# Patient Record
Sex: Female | Born: 1985 | Hispanic: Yes | Marital: Married | Smoking: Never smoker
Health system: Southern US, Community
[De-identification: ages and names within clinical notes are randomized; demographics above are authoritative.]

## PROBLEM LIST (undated history)

## (undated) DIAGNOSIS — T7840XA Allergy, unspecified, initial encounter: Secondary | ICD-10-CM

## (undated) DIAGNOSIS — F32A Depression, unspecified: Secondary | ICD-10-CM

## (undated) DIAGNOSIS — F419 Anxiety disorder, unspecified: Secondary | ICD-10-CM

## (undated) DIAGNOSIS — A6000 Herpesviral infection of urogenital system, unspecified: Secondary | ICD-10-CM

## (undated) DIAGNOSIS — K219 Gastro-esophageal reflux disease without esophagitis: Secondary | ICD-10-CM

## (undated) HISTORY — PX: WISDOM TOOTH EXTRACTION: SHX21

## (undated) HISTORY — DX: Anxiety disorder, unspecified: F41.9

## (undated) HISTORY — DX: Herpesviral infection of urogenital system, unspecified: A60.00

## (undated) HISTORY — DX: Gastro-esophageal reflux disease without esophagitis: K21.9

## (undated) HISTORY — DX: Depression, unspecified: F32.A

## (undated) HISTORY — DX: Allergy, unspecified, initial encounter: T78.40XA

---

## 2005-07-25 ENCOUNTER — Emergency Department: Payer: Self-pay | Admitting: Unknown Physician Specialty

## 2008-07-19 ENCOUNTER — Emergency Department (HOSPITAL_COMMUNITY): Admission: EM | Admit: 2008-07-19 | Discharge: 2008-07-19 | Payer: Self-pay | Admitting: Emergency Medicine

## 2010-07-23 ENCOUNTER — Inpatient Hospital Stay (HOSPITAL_COMMUNITY)
Admission: AD | Admit: 2010-07-23 | Discharge: 2010-07-23 | Disposition: A | Discharge status: Home / Self Care | Payer: Private Health Insurance - Indemnity | Source: Ambulatory Visit | Attending: Obstetrics & Gynecology | Admitting: Obstetrics & Gynecology

## 2010-07-23 DIAGNOSIS — O9989 Other specified diseases and conditions complicating pregnancy, childbirth and the puerperium: Secondary | ICD-10-CM

## 2010-07-23 DIAGNOSIS — M549 Dorsalgia, unspecified: Secondary | ICD-10-CM

## 2010-07-23 DIAGNOSIS — O99891 Other specified diseases and conditions complicating pregnancy: Secondary | ICD-10-CM | POA: Insufficient documentation

## 2010-07-23 DIAGNOSIS — R109 Unspecified abdominal pain: Secondary | ICD-10-CM

## 2010-07-23 LAB — URINALYSIS, ROUTINE W REFLEX MICROSCOPIC
Glucose, UA: NEGATIVE mg/dL
Ketones, ur: NEGATIVE mg/dL
Leukocytes, UA: NEGATIVE
Leukocytes, UA: NEGATIVE
Nitrite: NEGATIVE
Protein, ur: NEGATIVE mg/dL
Protein, ur: NEGATIVE mg/dL
Specific Gravity, Urine: 1.02 (ref 1.005–1.030)
Urobilinogen, UA: 1 mg/dL (ref 0.0–1.0)
pH: 7 (ref 5.0–8.0)

## 2010-07-23 LAB — URINE MICROSCOPIC-ADD ON

## 2010-07-24 LAB — URINE CULTURE: Colony Count: NO GROWTH

## 2010-10-21 ENCOUNTER — Observation Stay: Payer: Self-pay | Admitting: Obstetrics and Gynecology

## 2010-10-23 ENCOUNTER — Observation Stay: Payer: Self-pay

## 2010-10-27 ENCOUNTER — Inpatient Hospital Stay: Payer: Self-pay | Admitting: Obstetrics and Gynecology

## 2014-05-03 LAB — PULMONARY FUNCTION TEST

## 2014-06-05 LAB — PULMONARY FUNCTION TEST

## 2015-06-24 ENCOUNTER — Encounter: Payer: Self-pay | Admitting: Emergency Medicine

## 2015-06-24 ENCOUNTER — Emergency Department
Admission: EM | Admit: 2015-06-24 | Discharge: 2015-06-24 | Disposition: A | Discharge status: Home / Self Care | Payer: BLUE CROSS/BLUE SHIELD | Attending: Emergency Medicine | Admitting: Emergency Medicine

## 2015-06-24 DIAGNOSIS — J029 Acute pharyngitis, unspecified: Secondary | ICD-10-CM

## 2015-06-24 LAB — POCT RAPID STREP A: STREPTOCOCCUS, GROUP A SCREEN (DIRECT): NEGATIVE

## 2015-06-24 MED ORDER — MAGIC MOUTHWASH
10.0000 mL | Freq: Once | ORAL | Status: AC
  Administered 2015-06-24: 10 mL via ORAL
  Filled 2015-06-24: qty 10

## 2015-06-24 MED ORDER — MAGIC MOUTHWASH: 5.0000 mL | Freq: Three times a day (TID) | ORAL | Status: DC | PRN

## 2015-06-24 NOTE — ED Notes (Signed)
Patient presents to ED with c/o sore throat since Sunday and painful to swallow. Pain has been increasing Monday, denies fever, nausea or vomiting, or abdominal pain.  Speaking in complete sentences, respirations even and unlabored.

## 2015-06-24 NOTE — ED Notes (Signed)
Discharge instructions reviewed with patient. Questions fielded by this RN. Patient verbalizes understanding of instructions. Patient discharged home in stable condition per Beather Arbour MD . No acute distress noted at time of discharge.

## 2015-06-24 NOTE — ED Provider Notes (Signed)
[facility] Emergency Department Provider Note   ____________________________________________  Time seen: Approximately 4:37 AM  I have reviewed the triage vital signs and the nursing notes.   HISTORY  Chief Complaint Sore Throat    HPI Melia Liebler is a 30 y.o. female who presents to the ED from home with a chief complaint of sore throat. Patient reports a 3 day history of sore throat with painful swallowing. Has been exposed to another child with strep throat. Denies associated fever, chills, chest pain, shortness of breath, abdominal pain, nausea, vomiting, diarrhea. Denies recent travel or trauma. Nothing makes her symptoms better or worse.   Past medical history None  There are no active problems to display for this patient.   History reviewed. No pertinent past surgical history.  Current Outpatient Rx  Name  Route  Sig  Dispense  Refill  . magic mouthwash SOLN   Oral   Take 5 mLs by mouth 3 (three) times daily as needed for mouth pain.   75 mL   0     Allergies Review of patient's allergies indicates no known allergies.  No family history on file.  Social History Social History  Substance Use Topics  . Smoking status: Never Smoker   . Smokeless tobacco: None  . Alcohol Use: No    Review of Systems  Constitutional: No fever/chills. Eyes: No visual changes. ENT: Positive for sore throat. Cardiovascular: Denies chest pain. Respiratory: Denies shortness of breath. Gastrointestinal: No abdominal pain.  No nausea, no vomiting.  No diarrhea.  No constipation. Genitourinary: Negative for dysuria. Musculoskeletal: Negative for back pain. Skin: Negative for rash. Neurological: Negative for headaches, focal weakness or numbness.  10-point ROS otherwise negative.  ____________________________________________   PHYSICAL EXAM:  VITAL SIGNS: ED Triage Vitals  Enc Vitals Group     BP 06/24/15 0407 111/58 mmHg   Pulse Rate 06/24/15 0407 80     Resp 06/24/15 0407 18     Temp 06/24/15 0407 98.2 F (36.8 C)     Temp Source 06/24/15 0407 Oral     SpO2 06/24/15 0407 97 %     Weight 06/24/15 0407 187 lb (84.823 kg)     Height 06/24/15 0407 5\' 2"  (1.575 m)     Head Cir --      Peak Flow --      Pain Score 06/24/15 0408 2     Pain Loc --      Pain Edu? --      Excl. in [place]? --     Constitutional: Alert and oriented. Well appearing and in no acute distress. Eyes: Conjunctivae are normal. PERRL. EOMI. Head: Atraumatic. Nose: No congestion/rhinnorhea. Mouth/Throat: Mucous membranes are moist.  Oropharynx mildly erythematous without tonsillar swelling, exudate or peritonsillar abscess. Neck: No stridor.   Hematological/Lymphatic/Immunilogical: Shotty anterior cervical lymphadenopathy. Cardiovascular: Normal rate, regular rhythm. Grossly normal heart sounds.  Good peripheral circulation. Respiratory: Normal respiratory effort.  No retractions. Lungs CTAB. Gastrointestinal: Soft and nontender. No distention. No abdominal bruits. No CVA tenderness. Musculoskeletal: No lower extremity tenderness nor edema.  No joint effusions. Neurologic:  Normal speech and language. No gross focal neurologic deficits are appreciated. No gait instability. Skin:  Skin is warm, dry and intact. No rash noted. Psychiatric: Mood and affect are normal. Speech and behavior are normal.  ____________________________________________   LABS (all labs ordered are listed, but only abnormal results are displayed)  Labs Reviewed  POCT RAPID STREP A   ____________________________________________  EKG  None ____________________________________________  RADIOLOGY  None ____________________________________________   PROCEDURES  Procedure(s) performed: None  Critical Care performed: No  ____________________________________________   INITIAL IMPRESSION / ASSESSMENT AND PLAN / ED COURSE  Pertinent labs & imaging  results that were available during my care of the patient were reviewed by me and considered in my medical decision making (see chart for details).  30 year old female who presents with sore throat. Rapid strep negative. Will prescribe Magic mouthwash and patient will follow-up with her PCP next week. Strict return precautions given. Patient verbalizes understanding and agrees with plan of care. ____________________________________________   FINAL CLINICAL IMPRESSION(S) / ED DIAGNOSES  Final diagnoses:  Sore throat      NEW MEDICATIONS STARTED DURING THIS VISIT:  New Prescriptions   MAGIC MOUTHWASH SOLN    Take 5 mLs by mouth 3 (three) times daily as needed for mouth pain.     Note:  This document was prepared using Dragon voice recognition software and may include unintentional dictation errors.    Paulette Blanch, MD 06/24/15 [phone]

## 2015-06-24 NOTE — Discharge Instructions (Signed)
1. You may use throat wash as needed for discomfort (Magic mouthwash). 2. Return to the ER for worsening symptoms, persistent vomiting, difficulty breathing or other concerns.  Sore Throat A sore throat is pain, burning, irritation, or scratchiness of the throat. There is often pain or tenderness when swallowing or talking. A sore throat may be accompanied by other symptoms, such as coughing, sneezing, fever, and swollen neck glands. A sore throat is often the first sign of another sickness, such as a cold, flu, strep throat, or mononucleosis (commonly known as mono). Most sore throats go away without medical treatment. CAUSES  The most common causes of a sore throat include:  A viral infection, such as a cold, flu, or mono.  A bacterial infection, such as strep throat, tonsillitis, or whooping cough.  Seasonal allergies.  Dryness in the air.  Irritants, such as smoke or pollution.  Gastroesophageal reflux disease (GERD). HOME CARE INSTRUCTIONS   Only take over-the-counter medicines as directed by your caregiver.  Drink enough fluids to keep your urine clear or pale yellow.  Rest as needed.  Try using throat sprays, lozenges, or sucking on hard candy to ease any pain (if older than 4 years or as directed).  Sip warm liquids, such as broth, herbal tea, or warm water with honey to relieve pain temporarily. You may also eat or drink cold or frozen liquids such as frozen ice pops.  Gargle with salt water (mix 1 tsp salt with 8 oz of water).  Do not smoke and avoid secondhand smoke.  Put a cool-mist humidifier in your bedroom at night to moisten the air. You can also turn on a hot shower and sit in the bathroom with the door closed for 5-10 minutes. SEEK IMMEDIATE MEDICAL CARE IF:  You have difficulty breathing.  You are unable to swallow fluids, soft foods, or your saliva.  You have increased swelling in the throat.  Your sore throat does not get better in 7 days.  You have  nausea and vomiting.  You have a fever or persistent symptoms for more than 2-3 days.  You have a fever and your symptoms suddenly get worse. MAKE SURE YOU:   Understand these instructions.  Will watch your condition.  Will get help right away if you are not doing well or get worse.   This information is not intended to replace advice given to you by your health care provider. Make sure you discuss any questions you have with your health care provider.   Document Released: 02/12/2004 Document Revised: 01/25/2014 Document Reviewed: 09/12/2011 Elsevier Interactive Patient Education [employer].

## 2015-09-05 LAB — PULMONARY FUNCTION TEST

## 2016-06-16 LAB — PULMONARY FUNCTION TEST

## 2016-08-18 ENCOUNTER — Ambulatory Visit (INDEPENDENT_AMBULATORY_CARE_PROVIDER_SITE_OTHER): Payer: BLUE CROSS/BLUE SHIELD | Admitting: Obstetrics & Gynecology

## 2016-08-18 ENCOUNTER — Encounter: Payer: Self-pay | Admitting: Obstetrics & Gynecology

## 2016-08-18 VITALS — BP 100/68 | HR 86 | Ht 62.0 in | Wt 192.0 lb

## 2016-08-18 DIAGNOSIS — Z01419 Encounter for gynecological examination (general) (routine) without abnormal findings: Secondary | ICD-10-CM | POA: Diagnosis not present

## 2016-08-18 DIAGNOSIS — Z124 Encounter for screening for malignant neoplasm of cervix: Secondary | ICD-10-CM

## 2016-08-18 DIAGNOSIS — N898 Other specified noninflammatory disorders of vagina: Secondary | ICD-10-CM

## 2016-08-18 DIAGNOSIS — Z1151 Encounter for screening for human papillomavirus (HPV): Secondary | ICD-10-CM | POA: Diagnosis not present

## 2016-08-18 MED ORDER — METRONIDAZOLE 500 MG PO TABS: 500.0000 mg | ORAL_TABLET | Freq: Two times a day (BID) | ORAL | 0 refills | Status: DC

## 2016-08-18 NOTE — Progress Notes (Signed)
Subjective:    Jamie Ortega is a 31 y.o. MW P2 (59 and 36 yo kids)  female who presents for an new patient annual exam. She had an occasion last night of swelling, redness, and itching of her vulva. She took a shower. She would like this evaluated. She also has a monthly "itching" sensation on the inside. This has happened for the last 4 years. The patient is sexually active. GYN screening history: last pap: was normal. The patient wears seatbelts: yes. The patient participates in regular exercise: no. Has the patient ever been transfused or tattooed?: yes. The patient reports that there is not domestic violence in her life.   Menstrual History: OB History    Gravida Para Term Preterm AB Living   5 2 2  0 3 2   SAB TAB Ectopic Multiple Live Births   0 0 0 0 2      Menarche age: 55 Patient's last menstrual period was 08/07/2016.    The following portions of the patient's history were reviewed and updated as appropriate: allergies, current medications, past family history, past medical history, past social history, past surgical history and problem list.  Review of Systems Pertinent items are noted in HPI.   Married for 4 months, denies dyspareunia, uses condoms, monogamous for 4 months. Works at [employer], works in collections   Objective:    BP 100/68   Pulse 86   Ht 5\' 2"  (1.575 m)   Wt 192 lb (87.1 kg)   LMP 08/07/2016   BMI 35.12 kg/m   General Appearance:    Alert, cooperative, no distress, appears stated age  Head:    Normocephalic, without obvious abnormality, atraumatic  Eyes:    PERRL, conjunctiva/corneas clear, EOM's intact, fundi    benign, both eyes  Ears:    Normal TM's and external ear canals, both ears  Nose:   Nares normal, septum midline, mucosa normal, no drainage    or sinus tenderness  Throat:   Lips, mucosa, and tongue normal; teeth and gums normal  Neck:   Supple, symmetrical, trachea midline, no adenopathy;    thyroid:  no  enlargement/tenderness/nodules; no carotid   bruit or JVD  Back:     Symmetric, no curvature, ROM normal, no CVA tenderness  Lungs:     Clear to auscultation bilaterally, respirations unlabored  Chest Wall:    No tenderness or deformity   Heart:    Regular rate and rhythm, S1 and S2 normal, no murmur, rub   or gallop  Breast Exam:    No tenderness, masses, or nipple abnormality  Abdomen:     Soft, non-tender, bowel sounds active all four quadrants,    no masses, no organomegaly  Genitalia:    Normal female without lesion, or tenderness, NSSA, NT, no adnexal masses or tenderness, vaginal discharge c/w BV     Extremities:   Extremities normal, atraumatic, no cyanosis or edema  Pulses:   2+ and symmetric all extremities  Skin:   Skin color, texture, turgor normal, no rashes or lesions  Lymph nodes:   Cervical, supraclavicular, and axillary nodes normal  Neurologic:   CNII-XII intact, normal strength, sensation and reflexes    throughout  .    Assessment:    Healthy female exam.   Vaginal itching   Plan:     Thin prep Pap smear. with cotesting Flagyl for 7 days Check wet prep and HSV2IgG

## 2016-08-19 LAB — HSV 2 ANTIBODY, IGG: HSV 2 Glycoprotein G Ab, IgG: 13.6 index — ABNORMAL HIGH (ref 0.00–0.90)

## 2016-08-20 LAB — CYTOLOGY - PAP
Bacterial vaginitis: NEGATIVE
CANDIDA VAGINITIS: POSITIVE — AB
CHLAMYDIA, DNA PROBE: NEGATIVE
DIAGNOSIS: NEGATIVE
HPV (WINDOPATH): NOT DETECTED
NEISSERIA GONORRHEA: NEGATIVE

## 2016-08-23 ENCOUNTER — Telehealth: Payer: Self-pay

## 2016-08-23 MED ORDER — VALACYCLOVIR HCL 1 G PO TABS: 1000.0000 mg | ORAL_TABLET | Freq: Two times a day (BID) | ORAL | 6 refills | Status: DC

## 2016-08-23 NOTE — Telephone Encounter (Signed)
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 That would explain her monthly itching.  Sorry that I'm out of town and can't explain it to her. Can you please call in valtrex 1000 mg BID for a week with 6 refills. Thanks

## 2016-08-23 NOTE — Telephone Encounter (Signed)
Spoke with patient regarding results and recommendations.  Valtrex rx sent to pharmacy.

## 2016-10-12 ENCOUNTER — Telehealth: Payer: Self-pay | Admitting: *Deleted

## 2016-10-12 NOTE — Telephone Encounter (Signed)
Pt called in stating her disability forms state she needs psych referral. Did not see that this patient has been seen by [facility] pt back to get more info but no answer and VM was full.  Pt called back - pt was only seen here for annual exam and no mental health issues were discussed. Adv pt that she will need to contact PCP or therapist to have them do her referral to psych. Pt expressed understanding.

## 2016-11-18 DIAGNOSIS — F411 Generalized anxiety disorder: Secondary | ICD-10-CM | POA: Diagnosis not present

## 2016-11-26 DIAGNOSIS — F411 Generalized anxiety disorder: Secondary | ICD-10-CM | POA: Diagnosis not present

## 2016-12-07 DIAGNOSIS — F411 Generalized anxiety disorder: Secondary | ICD-10-CM | POA: Diagnosis not present

## 2017-02-17 DIAGNOSIS — R05 Cough: Secondary | ICD-10-CM | POA: Diagnosis not present

## 2017-02-17 DIAGNOSIS — B9689 Other specified bacterial agents as the cause of diseases classified elsewhere: Secondary | ICD-10-CM | POA: Diagnosis not present

## 2017-02-17 DIAGNOSIS — R0602 Shortness of breath: Secondary | ICD-10-CM | POA: Diagnosis not present

## 2017-02-17 DIAGNOSIS — J069 Acute upper respiratory infection, unspecified: Secondary | ICD-10-CM | POA: Diagnosis not present

## 2017-02-17 LAB — PULMONARY FUNCTION TEST

## 2017-03-24 DIAGNOSIS — F411 Generalized anxiety disorder: Secondary | ICD-10-CM | POA: Diagnosis not present

## 2017-04-07 DIAGNOSIS — M545 Low back pain: Secondary | ICD-10-CM | POA: Diagnosis not present

## 2017-04-07 DIAGNOSIS — J029 Acute pharyngitis, unspecified: Secondary | ICD-10-CM | POA: Diagnosis not present

## 2017-05-12 DIAGNOSIS — R05 Cough: Secondary | ICD-10-CM | POA: Diagnosis not present

## 2017-05-12 DIAGNOSIS — J3089 Other allergic rhinitis: Secondary | ICD-10-CM | POA: Diagnosis not present

## 2017-05-20 ENCOUNTER — Encounter: Payer: Self-pay | Admitting: Primary Care

## 2017-05-20 ENCOUNTER — Ambulatory Visit: Payer: BLUE CROSS/BLUE SHIELD | Admitting: Primary Care

## 2017-05-20 VITALS — BP 108/80 | HR 80 | Temp 98.0°F | Ht 62.0 in | Wt 214.2 lb

## 2017-05-20 DIAGNOSIS — A6000 Herpesviral infection of urogenital system, unspecified: Secondary | ICD-10-CM

## 2017-05-20 DIAGNOSIS — Z7689 Persons encountering health services in other specified circumstances: Secondary | ICD-10-CM | POA: Diagnosis not present

## 2017-05-20 DIAGNOSIS — E669 Obesity, unspecified: Secondary | ICD-10-CM | POA: Diagnosis not present

## 2017-05-20 NOTE — Patient Instructions (Signed)
It was a pleasure to meet you today! Please don't hesitate to call or message me with any questions. Welcome to [place]!   

## 2017-05-20 NOTE — Assessment & Plan Note (Signed)
No recent outbreaks, has not required use of her pills.

## 2017-05-20 NOTE — Assessment & Plan Note (Signed)
Discussed importance of weight loss to reduce chance of back pain and other medical problems.  Discussed that I do not prescribe weight loss medications but that I'm always happy to coach her on weight loss goals.

## 2017-05-20 NOTE — Progress Notes (Signed)
Subjective:    Patient ID: Jamie Ortega, female    DOB: 1985-06-02, 32 y.o.   MRN: [id number]  HPI  Jamie Ortega is a 32 year old female who presents today to establish care and discuss the problems mentioned below. Will obtain old records.   She see's her gynecologist annually for CPE, last CPE was in August 2018. Last pap smear was normal in 2018. She had a biometric screening done at her occupation, endorses labs were normal.   She does have intermittent myalgias with tingling to her left upper posterior back. Underwent imaging at [facility] which were negative. No symptoms today.   She has struggled with obesity for years, once managed on Phentermine but gained more weight when she came off.  1) Genital Herpes: Diagnosed in 2010. Currently managed on Valtrex 1000 mg PRN for genital outbreaks. She's not taken her Valtrex at all since prescribed last year. No recent outbreaks.   Review of Systems  Constitutional: Negative for unexpected weight change.  Genitourinary: Negative for menstrual problem.       Denies genital outbreaks  Musculoskeletal:       Intermittent myalgias to left posterior upper back. Intermittent lower back pain.  Neurological: Negative for headaches.       Past Medical History:  Diagnosis Date  . Genital herpes      Social History   Socioeconomic History  . Marital status: Married    Spouse name: Not on file  . Number of children: 2  . Years of education: Not on file  . Highest education level: Not on file  Occupational History  . Not on file  Social Needs  . Financial resource strain: Not on file  . Food insecurity:    Worry: Not on file    Inability: Not on file  . Transportation needs:    Medical: Not on file    Non-medical: Not on file  Tobacco Use  . Smoking status: Never Smoker  . Smokeless tobacco: Never Used  Substance and Sexual Activity  . Alcohol use: Yes    Comment: Socially  . Drug use: No  . Sexual  activity: Yes    Partners: Male    Birth control/protection: Condom  Lifestyle  . Physical activity:    Days per week: Not on file    Minutes per session: Not on file  . Stress: Not on file  Relationships  . Social connections:    Talks on phone: Not on file    Gets together: Not on file    Attends religious service: Not on file    Active member of club or organization: Not on file    Attends meetings of clubs or organizations: Not on file    Relationship status: Not on file  . Intimate partner violence:    Fear of current or ex partner: Not on file    Emotionally abused: Not on file    Physically abused: Not on file    Forced sexual activity: Not on file  Other Topics Concern  . Not on file  Social History Narrative   Married.   2 children.    Works at [employer] [place].    Enjoys kayaking, spending time outdoors.     Past Surgical History:  Procedure Laterality Date  . WISDOM TOOTH EXTRACTION      Family History  Problem Relation Age of Onset  . Cervical cancer Mother   . Endometriosis Mother   . Arthritis Mother   .  Neuropathy Mother   . Hypertension Mother   . Anxiety disorder Mother   . Post-traumatic stress disorder Mother   . Hyperlipidemia Father   . Rheum arthritis Maternal Grandmother   . COPD Maternal Grandmother   . Emphysema Maternal Grandmother   . Prostate cancer Maternal Grandfather   . Cancer Paternal Grandfather        Jaw    No Known Allergies  Current Outpatient Medications on File Prior to Visit  Medication Sig Dispense Refill  . valACYclovir (VALTREX) 1000 MG tablet Take 1 tablet (1,000 mg total) by mouth 2 (two) times daily. (Patient not taking: Reported on 05/20/2017) 14 tablet 6   No current facility-administered medications on file prior to visit.     BP 108/80   Pulse 80   Temp 98 F (36.7 C) (Oral)   Ht 5\' 2"  (1.575 m)   Wt 214 lb 4 oz (97.2 kg)   LMP 05/08/2017   SpO2 98%   BMI 39.19 kg/m    Objective:   Physical Exam   Constitutional: She appears well-nourished.  Neck: Neck supple.  Cardiovascular: Normal rate and regular rhythm.  Pulmonary/Chest: Effort normal and breath sounds normal.  Skin: Skin is warm and dry.  Psychiatric: She has a normal mood and affect.          Assessment & Plan:

## 2017-06-24 ENCOUNTER — Encounter: Payer: Self-pay | Admitting: Family Medicine

## 2017-06-24 ENCOUNTER — Ambulatory Visit: Payer: BLUE CROSS/BLUE SHIELD | Admitting: Family Medicine

## 2017-06-24 VITALS — BP 112/79 | HR 82 | Wt 213.0 lb

## 2017-06-24 DIAGNOSIS — N644 Mastodynia: Secondary | ICD-10-CM

## 2017-06-24 DIAGNOSIS — R1011 Right upper quadrant pain: Secondary | ICD-10-CM

## 2017-06-24 MED ORDER — OMEPRAZOLE MAGNESIUM 20 MG PO TBEC: 20.0000 mg | DELAYED_RELEASE_TABLET | Freq: Every day | ORAL | 3 refills | Status: DC

## 2017-06-24 NOTE — Progress Notes (Signed)
   Subjective:    Patient ID: Jamie Ortega is a 32 y.o. female presenting with Breast Pain (x 2-weeks)  on 06/24/2017  HPI: Right breast pain in the nipple which begins inside the breast and radiates out through nipple feels like a needle stabbing. Began several months ago and lasted 1-2 days. Now it has been present for 4-5 days. Not sure what triggers it. Comes on and lasts 30-60 mins. Holds pressure on breast to help alleviate pain. Has not noted lump in breast. Has no family history of breast cancer. Also reports RUQ pain and h/o GERD. Some associated nausea that lasts for several hours after eating.  Review of Systems  Constitutional: Negative for chills and fever.  Respiratory: Negative for shortness of breath.   Cardiovascular: Negative for chest pain.  Gastrointestinal: Negative for abdominal pain, nausea and vomiting.  Genitourinary: Negative for dysuria.  Skin: Negative for rash.      Objective:    BP 112/79   Pulse 82   Wt 213 lb (96.6 kg)   BMI 38.96 kg/m  Physical Exam  Constitutional: She is oriented to person, place, and time. She appears well-developed and well-nourished. No distress.  HENT:  Head: Normocephalic and atraumatic.  Eyes: No scleral icterus.  Neck: Neck supple.  Cardiovascular: Normal rate.  Pulmonary/Chest: Effort normal. Right breast exhibits no inverted nipple, no mass, no nipple discharge and no skin change. Left breast exhibits no inverted nipple, no mass, no nipple discharge and no skin change.  Abdominal: Soft.  Neurological: She is alert and oriented to person, place, and time.  Skin: Skin is warm and dry.  Psychiatric: She has a normal mood and affect.        Assessment & Plan:  Mastalgia - suspect fibrocystic change-check u/s to be sure. Evening primrose oil, caffeine avoidance - Plan: US BREAST LTD UNI RIGHT INC AXILLA  RUQ pain - Resume PPI, check RUQ u/s - Plan: US ABDOMEN LIMITED RUQ   Total face-to-face time with  patient: 15 minutes. Over 50% of encounter was spent on counseling and coordination of care. Return in about 1 month (around 07/22/2017), or if symptoms worsen or fail to improve.  Donnamae Jude 06/24/2017 9:27 AM

## 2017-06-24 NOTE — Patient Instructions (Signed)
Fibrocystic Breast Changes  Fibrocystic breast changes are changes that can make your breasts swollen or painful. These changes happen when tiny sacs of fluid (cysts) form in the breast. This is a common condition. It does not mean that you have cancer. It usually happens because of hormone changes during a monthly period.  Follow these instructions at home:   Check your breasts after every monthly period. If you do not have monthly periods, check your breasts on the first day of every month. Check for:  ? Soreness.  ? New swelling or puffiness.  ? A change in breast size.  ? A change in a lump that was already there.   Take over-the-counter and prescription medicines only as told by your doctor.   Wear a support or sports bra that fits well. Wear this support especially when you are exercising.   Avoid or have less caffeine, fat, and sugar in what you eat and drink as told by your doctor.  Contact a doctor if:   You have fluid coming from your nipple, especially if the fluid has blood in it.   You have new lumps or bumps in your breast.   Your breast gets puffy, red, and painful.   You have changes in how your breast looks.   Your nipple looks flat or it sinks into your breast.  Get help right away if:   Your breast turns red, and the redness is spreading.  Summary   Fibrocystic breast changes are changes that can make your breasts swollen or painful.   This condition can happen when you have hormone changes during your monthly period.   With this condition, it is important to check your breasts after every monthly period. If you do not have monthly periods, check your breasts on the first day of every month.  This information is not intended to replace advice given to you by your health care provider. Make sure you discuss any questions you have with your health care provider.  Document Released: 12/18/2007 Document Revised: 09/18/2015 Document Reviewed: 09/18/2015  Elsevier Interactive Patient  Education  2017 [employer]

## 2017-06-26 ENCOUNTER — Encounter: Payer: Self-pay | Admitting: Family Medicine

## 2017-06-30 ENCOUNTER — Encounter: Payer: Self-pay | Admitting: Radiology

## 2017-07-08 ENCOUNTER — Ambulatory Visit (HOSPITAL_COMMUNITY)
Admission: RE | Admit: 2017-07-08 | Discharge: 2017-07-08 | Disposition: A | Discharge status: Home / Self Care | Payer: BLUE CROSS/BLUE SHIELD | Source: Ambulatory Visit | Attending: Family Medicine | Admitting: Family Medicine

## 2017-07-08 DIAGNOSIS — R1011 Right upper quadrant pain: Secondary | ICD-10-CM | POA: Insufficient documentation

## 2017-09-09 ENCOUNTER — Ambulatory Visit: Payer: BLUE CROSS/BLUE SHIELD | Admitting: Certified Nurse Midwife

## 2017-09-09 ENCOUNTER — Other Ambulatory Visit (INDEPENDENT_AMBULATORY_CARE_PROVIDER_SITE_OTHER): Payer: BLUE CROSS/BLUE SHIELD

## 2017-09-09 ENCOUNTER — Encounter: Payer: Self-pay | Admitting: Certified Nurse Midwife

## 2017-09-09 ENCOUNTER — Other Ambulatory Visit: Payer: Self-pay | Admitting: Certified Nurse Midwife

## 2017-09-09 VITALS — BP 110/79 | HR 76 | Ht 62.0 in | Wt 209.5 lb

## 2017-09-09 DIAGNOSIS — Z3A01 Less than 8 weeks gestation of pregnancy: Secondary | ICD-10-CM | POA: Diagnosis not present

## 2017-09-09 DIAGNOSIS — N926 Irregular menstruation, unspecified: Secondary | ICD-10-CM

## 2017-09-09 DIAGNOSIS — D259 Leiomyoma of uterus, unspecified: Secondary | ICD-10-CM | POA: Insufficient documentation

## 2017-09-09 DIAGNOSIS — N912 Amenorrhea, unspecified: Secondary | ICD-10-CM

## 2017-09-09 DIAGNOSIS — Z3201 Encounter for pregnancy test, result positive: Secondary | ICD-10-CM

## 2017-09-09 DIAGNOSIS — O26899 Other specified pregnancy related conditions, unspecified trimester: Secondary | ICD-10-CM | POA: Diagnosis not present

## 2017-09-09 DIAGNOSIS — N8311 Corpus luteum cyst of right ovary: Secondary | ICD-10-CM

## 2017-09-09 DIAGNOSIS — O26859 Spotting complicating pregnancy, unspecified trimester: Secondary | ICD-10-CM | POA: Diagnosis not present

## 2017-09-09 DIAGNOSIS — O3411 Maternal care for benign tumor of corpus uteri, first trimester: Secondary | ICD-10-CM

## 2017-09-09 DIAGNOSIS — R109 Unspecified abdominal pain: Secondary | ICD-10-CM

## 2017-09-09 DIAGNOSIS — O219 Vomiting of pregnancy, unspecified: Secondary | ICD-10-CM

## 2017-09-09 LAB — POCT URINE PREGNANCY: Preg Test, Ur: POSITIVE — AB

## 2017-09-09 MED ORDER — DOXYLAMINE-PYRIDOXINE 10-10 MG PO TBEC: 2.0000 | DELAYED_RELEASE_TABLET | Freq: Every day | ORAL | 5 refills | Status: DC

## 2017-09-09 MED ORDER — RANITIDINE HCL 150 MG PO TABS: 150.0000 mg | ORAL_TABLET | Freq: Two times a day (BID) | ORAL | 1 refills | Status: DC

## 2017-09-09 NOTE — Progress Notes (Signed)
New pt is here for a confirmation of pregnancy. Had 1 positive home test. States she has all the symptoms.

## 2017-09-09 NOTE — Patient Instructions (Addendum)
WHAT OB PATIENTS CAN EXPECT   Confirmation of pregnancy and ultrasound ordered if medically indicated-[redacted] weeks gestation  New OB (NOB) intake with nurse and New OB (NOB) labs- [redacted] weeks gestation  New OB (NOB) physical examination with provider- 11/[redacted] weeks gestation  Flu vaccine-[redacted] weeks gestation  Anatomy scan-[redacted] weeks gestation  Glucose tolerance test, blood work to test for anemia, T-dap vaccine-[redacted] weeks gestation  Vaginal swabs/cultures-STD/Group B strep-[redacted] weeks gestation  Appointments every 4 weeks until 28 weeks  Every 2 weeks from 28 weeks until 36 weeks  Weekly visits from 36 weeks until delivery  Eating Plan for Pregnant Women While you are pregnant, your body will require additional nutrition to help support your growing baby. It is recommended that you consume:  150 additional calories each day during your first trimester.  300 additional calories each day during your second trimester.  300 additional calories each day during your third trimester.  Eating a healthy, well-balanced diet is very important for your health and for your baby's health. You also have a higher need for some vitamins and minerals, such as folic acid, calcium, iron, and vitamin D. What do I need to know about eating during pregnancy?  Do not try to lose weight or go on a diet during pregnancy.  Choose healthy, nutritious foods. Choose  of a sandwich with a glass of milk instead of a candy bar or a high-calorie sugar-sweetened beverage.  Limit your overall intake of foods that have "empty calories." These are foods that have little nutritional value, such as sweets, desserts, candies, sugar-sweetened beverages, and fried foods.  Eat a variety of foods, especially fruits and vegetables.  Take a prenatal vitamin to help meet the additional needs during pregnancy, specifically for folic acid, iron, calcium, and vitamin D.  Remember to stay active. Ask your health care provider for exercise  recommendations that are specific to you.  Practice good food safety and cleanliness, such as washing your hands before you eat and after you prepare raw meat. This helps to prevent foodborne illnesses, such as listeriosis, that can be very dangerous for your baby. Ask your health care provider for more information about listeriosis. What does 150 extra calories look like? Healthy options for an additional 150 calories each day could be any of the following:  Plain low-fat yogurt (6-8 oz) with  cup of berries.  1 apple with 2 teaspoons of peanut butter.  Cut-up vegetables with  cup of hummus.  Low-fat chocolate milk (8 oz or 1 cup).  1 string cheese with 1 medium orange.   of a peanut butter and jelly sandwich on whole-wheat bread (1 tsp of peanut butter).  For 300 calories, you could eat two of those healthy options each day. What is a healthy amount of weight to gain? The recommended amount of weight for you to gain is based on your pre-pregnancy BMI. If your pre-pregnancy BMI was:  Less than 18 (underweight), you should gain 28-40 lb.  18-24.9 (normal), you should gain 25-35 lb.  25-29.9 (overweight), you should gain 15-25 lb.  Greater than 30 (obese), you should gain 11-20 lb.  What if I am having twins or multiples? Generally, pregnant women who will be having twins or multiples may need to increase their daily calories by 300-600 calories each day. The recommended range for total weight gain is 25-54 lb, depending on your pre-pregnancy BMI. Talk with your health care provider for specific guidance about additional nutritional needs, weight gain, and exercise during  your pregnancy. What foods can I eat? Grains Any grains. Try to choose whole grains, such as whole-wheat bread, oatmeal, or brown rice. Vegetables Any vegetables. Try to eat a variety of colors and types of vegetables to get a full range of vitamins and minerals. Remember to wash your vegetables well before  eating. Fruits Any fruits. Try to eat a variety of colors and types of fruit to get a full range of vitamins and minerals. Remember to wash your fruits well before eating. Meats and Other Protein Sources Lean meats, including chicken, [place], fish, and lean cuts of beef, veal, or pork. Make sure that all meats are cooked to "well done." Tofu. Tempeh. Beans. Eggs. Peanut butter and other nut butters. Seafood, such as shrimp, crab, and lobster. If you choose fish, select types that are higher in omega-3 fatty acids, including salmon, herring, mussels, trout, sardines, and pollock. Make sure that all meats are cooked to food-safe temperatures. Dairy Pasteurized milk and milk alternatives. Pasteurized yogurt and pasteurized cheese. Cottage cheese. Sour cream. Beverages Water. Juices that contain 100% fruit juice or vegetable juice. Caffeine-free teas and decaffeinated coffee. Drinks that contain caffeine are okay to drink, but it is better to avoid caffeine. Keep your total caffeine intake to less than 200 mg each day (12 oz of coffee, tea, or soda) or as directed by your health care provider. Condiments Any pasteurized condiments. Sweets and Desserts Any sweets and desserts. Fats and Oils Any fats and oils. The items listed above may not be a complete list of recommended foods or beverages. Contact your dietitian for more options. What foods are not recommended? Vegetables Unpasteurized (raw) vegetable juices. Fruits Unpasteurized (raw) fruit juices. Meats and Other Protein Sources Cured meats that have nitrates, such as bacon, salami, and hotdogs. Luncheon meats, bologna, or other deli meats (unless they are reheated until they are steaming hot). Refrigerated pate, meat spreads from a meat counter, smoked seafood that is found in the refrigerated section of a store. Raw fish, such as sushi or sashimi. High mercury content fish, such as tilefish, shark, swordfish, and king mackerel. Raw meats,  such as tuna or beef tartare. Undercooked meats and poultry. Make sure that all meats are cooked to food-safe temperatures. Dairy Unpasteurized (raw) milk and any foods that have raw milk in them. Soft cheeses, such as feta, queso blanco, queso fresco, Brie, Camembert cheeses, blue-veined cheeses, and Panela cheese (unless it is made with pasteurized milk, which must be stated on the label). Beverages Alcohol. Sugar-sweetened beverages, such as sodas, teas, or energy drinks. Condiments Homemade fermented foods and drinks, such as pickles, sauerkraut, or kombucha drinks. (Store-bought pasteurized versions of these are okay.) Other Salads that are made in the store, such as ham salad, chicken salad, egg salad, tuna salad, and seafood salad. The items listed above may not be a complete list of foods and beverages to avoid. Contact your dietitian for more information. This information is not intended to replace advice given to you by your health care provider. Make sure you discuss any questions you have with your health care provider. Document Released: 10/19/2013 Document Revised: 06/12/2015 Document Reviewed: 06/19/2013 Elsevier Interactive Patient Education  2018 [employer]. Morning Sickness Morning sickness is when you feel sick to your stomach (nauseous) during pregnancy. You may feel sick to your stomach and throw up (vomit). You may feel sick in the morning, but you can feel this way any time of day. Some women feel very sick to their stomach and  cannot stop throwing up (hyperemesis gravidarum). Follow these instructions at home:  Only take medicines as told by your doctor.  Take multivitamins as told by your doctor. Taking multivitamins before getting pregnant can stop or lessen the harshness of morning sickness.  Eat dry toast or unsalted crackers before getting out of bed.  Eat 5 to 6 small meals a day.  Eat dry and bland foods like rice and baked potatoes.  Do not drink liquids  with meals. Drink between meals.  Do not eat greasy, fatty, or spicy foods.  Have someone cook for you if the smell of food causes you to feel sick or throw up.  If you feel sick to your stomach after taking prenatal vitamins, take them at night or with a snack.  Eat protein when you need a snack (nuts, yogurt, cheese).  Eat unsweetened gelatins for dessert.  Wear a bracelet used for sea sickness (acupressure wristband).  Go to a doctor that puts thin needles into certain body points (acupuncture) to improve how you feel.  Do not smoke.  Use a humidifier to keep the air in your house free of odors.  Get lots of fresh air. Contact a doctor if:  You need medicine to feel better.  You feel dizzy or lightheaded.  You are losing weight. Get help right away if:  You feel very sick to your stomach and cannot stop throwing up.  You pass out (faint). This information is not intended to replace advice given to you by your health care provider. Make sure you discuss any questions you have with your health care provider. Document Released: 02/12/2004 Document Revised: 06/12/2015 Document Reviewed: 06/21/2012 Elsevier Interactive Patient Education  2017 [place]. Back Pain in Pregnancy Back pain during pregnancy is common. Back pain may be caused by several factors that are related to changes during your pregnancy. Follow these instructions at home: Managing pain, stiffness, and swelling  If directed, apply ice for sudden (acute) back pain. ? Put ice in a plastic bag. ? Place a towel between your skin and the bag. ? Leave the ice on for 20 minutes, 2-3 times per day.  If directed, apply heat to the affected area before you exercise: ? Place a towel between your skin and the heat pack or heating pad. ? Leave the heat on for 20-30 minutes. ? Remove the heat if your skin turns bright red. This is especially important if you are unable to feel pain, heat, or cold. You may have a  greater risk of getting burned. Activity  Exercise as told by your health care provider. Exercising is the best way to prevent or manage back pain.  Listen to your body when lifting. If lifting hurts, ask for help or bend your knees. This uses your leg muscles instead of your back muscles.  Squat down when picking up something from the floor. Do not bend over.  Only use bed rest as told by your health care provider. Bed rest should only be used for the most severe episodes of back pain. Standing, Sitting, and Lying Down  Do not stand in one place for long periods of time.  Use good posture when sitting. Make sure your head rests over your shoulders and is not hanging forward. Use a pillow on your lower back if necessary.  Try sleeping on your side, preferably the left side, with a pillow or two between your legs. If you are sore after a night's rest, your bed may be  too soft. A firm mattress may provide more support for your back during pregnancy. General instructions  Do not wear high heels.  Eat a healthy diet. Try to gain weight within your health care provider's recommendations.  Use a maternity girdle, elastic sling, or back brace as told by your health care provider.  Take over-the-counter and prescription medicines only as told by your health care provider.  Keep all follow-up visits as told by your health care provider. This is important. This includes any visits with any specialists, such as a physical therapist. Contact a health care provider if:  Your back pain interferes with your daily activities.  You have increasing pain in other parts of your body. Get help right away if:  You develop numbness, tingling, weakness, or problems with the use of your arms or legs.  You develop severe back pain that is not controlled with medicine.  You have a sudden change in bowel or bladder control.  You develop shortness of breath, dizziness, or you faint.  You develop  nausea, vomiting, or sweating.  You have back pain that is a rhythmic, cramping pain similar to labor pains. Labor pain is usually 1-2 minutes apart, lasts for about 1 minute, and involves a bearing down feeling or pressure in your pelvis.  You have back pain and your water breaks or you have vaginal bleeding.  You have back pain or numbness that travels down your leg.  Your back pain developed after you fell.  You develop pain on one side of your back.  You see blood in your urine.  You develop skin blisters in the area of your back pain. This information is not intended to replace advice given to you by your health care provider. Make sure you discuss any questions you have with your health care provider. Document Released: 04/14/2005 Document Revised: 06/12/2015 Document Reviewed: 09/18/2014 Elsevier Interactive Patient Education  2018 [employer]. Common Medications Safe in Pregnancy  Acne:      Constipation:  Benzoyl Peroxide     Colace  Clindamycin      Dulcolax Suppository  Topica Erythromycin     Fibercon  Salicylic Acid      Metamucil         Miralax AVOID:        Senakot   Accutane    Cough:  Retin-A       Cough Drops  Tetracycline      Phenergan w/ Codeine if Rx  Minocycline      Robitussin (Plain & DM)  Antibiotics:     Crabs/Lice:  Ceclor       RID  Cephalosporins    AVOID:  E-Mycins      Kwell  Keflex  Macrobid/Macrodantin   Diarrhea:  Penicillin      Kao-Pectate  Zithromax      Imodium AD         PUSH FLUIDS AVOID:       Cipro     Fever:  Tetracycline      Tylenol (Regular or Extra  Minocycline       Strength)  Levaquin      Extra Strength-Do not          Exceed 8 tabs/24 hrs Caffeine:        '200mg'$ /day (equiv. To 1 cup of coffee or  approx. 3 12 oz sodas)         Gas: Cold/Hayfever:       Gas-X  Benadryl  Mylicon  Claritin       Phazyme  **Claritin-D        Chlor-Trimeton    Headaches:  Dimetapp      ASA-Free  Excedrin  Drixoral-Non-Drowsy     Cold Compress  Mucinex (Guaifenasin)     Tylenol (Regular or Extra  Sudafed/Sudafed-12 Hour     Strength)  **Sudafed PE Pseudoephedrine   Tylenol Cold & Sinus     Vicks Vapor Rub  Zyrtec  **AVOID if Problems With Blood Pressure         Heartburn: Avoid lying down for at least 1 hour after meals  Aciphex      Maalox     Rash:  Milk of Magnesia     Benadryl    Mylanta       1% Hydrocortisone Cream  Pepcid  Pepcid Complete   Sleep Aids:  Prevacid      Ambien   Prilosec       Benadryl  Rolaids       Chamomile Tea  Tums (Limit 4/day)     Unisom  Zantac       Tylenol PM         Warm milk-add vanilla or  Hemorrhoids:       Sugar for taste  Anusol/Anusol H.C.  (RX: Analapram 2.5%)  Sugar Substitutes:  Hydrocortisone OTC     Ok in moderation  Preparation H      Tucks        Vaseline lotion applied to tissue with wiping    Herpes:     Throat:  Acyclovir      Oragel  Famvir  Valtrex     Vaccines:         Flu Shot Leg Cramps:       *Gardasil  Benadryl      Hepatitis A         Hepatitis B Nasal Spray:       Pneumovax  Saline Nasal Spray     Polio Booster         Tetanus Nausea:       Tuberculosis test or PPD  Vitamin B6 25 mg TID   AVOID:    Dramamine      *Gardasil  Emetrol       Live Poliovirus  Ginger Root 250 mg QID    MMR (measles, mumps &  High Complex Carbs @ Bedtime    rebella)  Sea Bands-Accupressure    Varicella (Chickenpox)  Unisom 1/2 tab TID     *No known complications           If received before Pain:         Known pregnancy;   Darvocet       Resume series after  Lortab        Delivery  Percocet    Yeast:   Tramadol      Femstat  Tylenol 3      Gyne-lotrimin  Ultram       Monistat  Vicodin           MISC:         All Sunscreens           Hair Coloring/highlights          Insect Repellant's          (Including DEET)         Mystic Tans First Trimester of Pregnancy The first trimester of pregnancy is from week  1 until the  end of week 13 (months 1 through 3). During this time, your baby will begin to develop inside you. At 6-8 weeks, the eyes and face are formed, and the heartbeat can be seen on ultrasound. At the end of 12 weeks, all the baby's organs are formed. Prenatal care is all the medical care you receive before the birth of your baby. Make sure you get good prenatal care and follow all of your doctor's instructions. Follow these instructions at home: Medicines  Take over-the-counter and prescription medicines only as told by your doctor. Some medicines are safe and some medicines are not safe during pregnancy.  Take a prenatal vitamin that contains at least 600 micrograms (mcg) of folic acid.  If you have trouble pooping (constipation), take medicine that will make your stool soft (stool softener) if your doctor approves. Eating and drinking  Eat regular, healthy meals.  Your doctor will tell you the amount of weight gain that is right for you.  Avoid raw meat and uncooked cheese.  If you feel sick to your stomach (nauseous) or throw up (vomit): ? Eat 4 or 5 small meals a day instead of 3 large meals. ? Try eating a few soda crackers. ? Drink liquids between meals instead of during meals.  To prevent constipation: ? Eat foods that are high in fiber, like fresh fruits and vegetables, whole grains, and beans. ? Drink enough fluids to keep your pee (urine) clear or pale yellow. Activity  Exercise only as told by your doctor. Stop exercising if you have cramps or pain in your lower belly (abdomen) or low back.  Do not exercise if it is too hot, too humid, or if you are in a place of great height (high altitude).  Try to avoid standing for long periods of time. Move your legs often if you must stand in one place for a long time.  Avoid heavy lifting.  Wear low-heeled shoes. Sit and stand up straight.  You can have sex unless your doctor tells you not to. Relieving pain and  discomfort  Wear a good support bra if your breasts are sore.  Take warm water baths (sitz baths) to soothe pain or discomfort caused by hemorrhoids. Use hemorrhoid cream if your doctor says it is okay.  Rest with your legs raised if you have leg cramps or low back pain.  If you have puffy, bulging veins (varicose veins) in your legs: ? Wear support hose or compression stockings as told by your doctor. ? Raise (elevate) your feet for 15 minutes, 3-4 times a day. ? Limit salt in your food. Prenatal care  Schedule your prenatal visits by the twelfth week of pregnancy.  Write down your questions. Take them to your prenatal visits.  Keep all your prenatal visits as told by your doctor. This is important. Safety  Wear your seat belt at all times when driving.  Make a list of emergency phone numbers. The list should include numbers for family, friends, the hospital, and police and fire departments. General instructions  Ask your doctor for a referral to a local prenatal class. Begin classes no later than at the start of month 6 of your pregnancy.  Ask for help if you need counseling or if you need help with nutrition. Your doctor can give you advice or tell you where to go for help.  Do not use hot tubs, steam rooms, or saunas.  Do not douche or use tampons or scented sanitary pads.  Do not  cross your legs for long periods of time.  Avoid all herbs and alcohol. Avoid drugs that are not approved by your doctor.  Do not use any tobacco products, including cigarettes, chewing tobacco, and electronic cigarettes. If you need help quitting, ask your doctor. You may get counseling or other support to help you quit.  Avoid cat litter boxes and soil used by cats. These carry germs that can cause birth defects in the baby and can cause a loss of your baby (miscarriage) or stillbirth.  Visit your dentist. At home, brush your teeth with a soft toothbrush. Be gentle when you floss. Contact a  doctor if:  You are dizzy.  You have mild cramps or pressure in your lower belly.  You have a nagging pain in your belly area.  You continue to feel sick to your stomach, you throw up, or you have watery poop (diarrhea).  You have a bad smelling fluid coming from your vagina.  You have pain when you pee (urinate).  You have increased puffiness (swelling) in your face, hands, legs, or ankles. Get help right away if:  You have a fever.  You are leaking fluid from your vagina.  You have spotting or bleeding from your vagina.  You have very bad belly cramping or pain.  You gain or lose weight rapidly.  You throw up blood. It may look like coffee grounds.  You are around people who have [place] measles, fifth disease, or chickenpox.  You have a very bad headache.  You have shortness of breath.  You have any kind of trauma, such as from a fall or a car accident. Summary  The first trimester of pregnancy is from week 1 until the end of week 13 (months 1 through 3).  To take care of yourself and your unborn baby, you will need to eat healthy meals, take medicines only if your doctor tells you to do so, and do activities that are safe for you and your baby.  Keep all follow-up visits as told by your doctor. This is important as your doctor will have to ensure that your baby is healthy and growing well. This information is not intended to replace advice given to you by your health care provider. Make sure you discuss any questions you have with your health care provider. Document Released: 06/23/2007 Document Revised: 01/13/2016 Document Reviewed: 01/13/2016 Elsevier Interactive Patient Education  2017 [employer]  Uterine Fibroids Uterine fibroids are tissue masses (tumors). They are also called leiomyomas. They can develop inside of a woman's womb (uterus). They can grow very large. Fibroids are not cancerous (benign). Most fibroids do not require medical treatment. Follow  these instructions at home:  Keep all follow-up visits as told by your doctor. This is important.  Take medicines only as told by your doctor. ? If you were prescribed a hormone treatment, take the hormone medicines exactly as told. ? Do not take aspirin. It can cause bleeding.  Ask your doctor about taking iron pills and increasing the amount of dark green, leafy vegetables in your diet. These actions can help to boost your blood iron levels.  Pay close attention to your period. Tell your doctor about any changes, such as: ? Increased blood flow. This may require you to use more pads or tampons than usual per month. ? A change in the number of days that your period lasts per month. ? A change in symptoms that come with your period, such as back pain or  cramping in your belly area (abdomen). Contact a doctor if:  You have pain in your back or the area between your hip bones (pelvic area) that is not controlled by medicines.  You have pain in your abdomen that is not controlled with medicines.  You have an increase in bleeding between and during periods.  You soak tampons or pads in a half hour or less.  You feel lightheaded.  You feel extra tired.  You feel weak. Get help right away if:  You pass out (faint).  You have a sudden increase in pelvic pain. This information is not intended to replace advice given to you by your health care provider. Make sure you discuss any questions you have with your health care provider. Document Released: 02/06/2010 Document Revised: 09/05/2015 Document Reviewed: 07/03/2013 Elsevier Interactive Patient Education  2018 [employer]. Ranitidine tablets or capsules What is this medicine? RANITIDINE (ra NYE te deen) is a type of antihistamine that blocks the release of stomach acid. It is used to treat stomach or intestinal ulcers. It can relieve ulcer pain and discomfort, and the heartburn from acid reflux. This medicine may be used for other  purposes; ask your health care provider or pharmacist if you have questions. COMMON BRAND NAME(S): Acid Reducer, Ranitidine, Taladine, Wal-Zan, Zantac, Zantac 150, Zantac 75 What should I tell my health care provider before I take this medicine? They need to know if you have any of these conditions: -kidney disease -liver disease -porphyria -an unusual or allergic reaction to ranitidine, other medicines, foods, dyes, or preservatives -pregnant or trying to get pregnant -breast-feeding How should I use this medicine? Take this medicine by mouth with a glass of water. Follow the directions on the prescription label. If you only take this medicine once a day, take it at bedtime. Take your medicine at regular intervals. Do not take your medicine more often than directed. Do not stop taking except on your doctor's advice. Talk to your pediatrician regarding the use of this medicine in children. Special care may be needed. Overdosage: If you think you have taken too much of this medicine contact a poison control center or emergency room at once. NOTE: This medicine is only for you. Do not share this medicine with others. What if I miss a dose? If you miss a dose, take it as soon as you can. If it is almost time for your next dose, take only that dose. Do not take double or extra doses. What may interact with this medicine? -atazanavir -delavirdine -gefitinib -glipizide -ketoconazole -midazolam -procainamide -propantheline -triazolam -warfarin This list may not describe all possible interactions. Give your health care provider a list of all the medicines, herbs, non-prescription drugs, or dietary supplements you use. Also tell them if you smoke, drink alcohol, or use illegal drugs. Some items may interact with your medicine. What should I watch for while using this medicine? Tell your doctor or health care professional if your condition does not start to get better or gets worse. You may need  to take this medicine for several days as prescribed before your symptoms get better. Finish the full course of tablets prescribed, even if you feel better. Do not smoke cigarettes or drink alcohol. These increase irritation in your stomach and can lengthen the time it will take for ulcers to heal. Cigarettes and alcohol can also make acid reflux or heartburn worse. If you get black, tarry stools or vomit up what looks like coffee grounds, call your doctor or  health care professional at once. You may have a bleeding ulcer. What side effects may I notice from receiving this medicine? Side effects that you should report to your doctor or health care professional as soon as possible: -agitation, nervousness, depression, hallucinations -allergic reactions like skin rash, itching or hives, swelling of the face, lips, or tongue -breast enlargement in both males and females -breathing problems -redness, blistering, peeling or loosening of the skin, including inside the mouth -unusual bleeding or bruising -unusually weak or tired -vomiting -yellowing of the skin or eyes Side effects that usually do not require medical attention (report to your doctor or health care professional if they continue or are bothersome): -constipation or diarrhea -dizziness -headache -nausea This list may not describe all possible side effects. Call your doctor for medical advice about side effects. You may report side effects to FDA at 1-800-FDA-1088. Where should I keep my medicine? Keep out of the reach of children. Store at room temperature between 15 and 30 degrees C (59 and 86 degrees F). Protect from light and moisture. Keep container tightly closed. Throw away any unused medicine after the expiration date. NOTE: This sheet is a summary. It may not cover all possible information. If you have questions about this medicine, talk to your doctor, pharmacist, or health care provider.  2018 Elsevier/Gold Standard  (2012-04-26 14:50:34) Doxylamine; Pyridoxine delayed and extended-release tablets What is this medicine? Doxylamine; pyridoxine (dox IL a meen; peer i DOX een) is a combination of an antihistamine and vitamin B6. The drug is used to treat nausea and vomiting associated with pregnancy. This medicine may be used for other purposes; ask your health care provider or pharmacist if you have questions. COMMON BRAND NAME(S): Diclegis What should I tell my health care provider before I take this medicine? They need to know if you have any of these conditions: -contact lenses -glaucoma -liver disease -lung or breathing disease, like asthma or emphysema -pain or trouble passing urine -prostate trouble -ulcers or other stomach problems -an unusual or allergic reaction to doxylamine or pyridoxine (vitamin B6), other medicines, foods, dyes, or preservatives -breast-feeding How should I use this medicine? Take this medicine by mouth with a glass of water. Do not cut, crush or chew this medicine. Follow the directions on the package or prescription label. Take this medicine on an empty stomach. Take your medicine at regular intervals. Do not take it more often than directed. Talk to your pediatrician regarding the use of this medicine in children. Special care may be needed. Overdosage: If you think you have taken too much of this medicine contact a poison control center or emergency room at once. NOTE: This medicine is only for you. Do not share this medicine with others. What if I miss a dose? If you miss a dose, take it as soon as you can. If it is almost time for your next dose, take only that dose. Do not take double or extra doses. What may interact with this medicine? -alcohol -atropine -antihistamines for allergy, cough and cold -certain medicines for bladder problems like oxybutynin, tolterodine -certain medicines for stomach problems like dicyclomine, hyoscyamine -certain medicines for travel  sickness like scopolamine -certain medicines for Parkinson's disease like benztropine, trihexyphenidyl -ipratropium -MAOIs like Carbex, Eldepryl, Marplan, Nardil, and Parnate This list may not describe all possible interactions. Give your health care provider a list of all the medicines, herbs, non-prescription drugs, or dietary supplements you use. Also tell them if you smoke, drink alcohol, or use illegal  drugs. Some items may interact with your medicine. What should I watch for while using this medicine? Tell your doctor or healthcare professional if your symptoms do not start to get better or if they get worse. See your doctor right away if you get a high fever or have problems breathing. Your mouth may get dry. Chewing sugarless gum or sucking hard candy, and drinking plenty of water may help. Contact your doctor if the problem does not go away or is severe. This medicine may cause dry eyes and blurred vision. If you wear contact lenses you may feel some discomfort. Lubricating drops may help. See your eye doctor if the problem does not go away or is severe. You may get drowsy or dizzy. Do not drive, use machinery, or do anything that needs mental alertness until you know how this medicine affects you. Do not stand or sit up quickly, especially if you are an older patient. This reduces the risk of dizzy or fainting spells. What side effects may I notice from receiving this medicine? Side effects that you should report to your doctor or health care professional as soon as possible: -allergic reactions like skin rash, itching or hives, swelling of the face, lips, or tongue -changes in vision -confused, agitated, or nervous -fast, irregular heartbeat -feeling faint or lightheaded, falls -muscle or facial twitches -seizure -trouble passing urine or change in the amount of urine Side effects that usually do not require medical attention (report to your doctor or health care professional if they  continue or are bothersome): -dry mouth -headache -loss of appetite -stomach upset This list may not describe all possible side effects. Call your doctor for medical advice about side effects. You may report side effects to FDA at 1-800-FDA-1088. Where should I keep my medicine? Keep out of the reach of children. Store at room temperature between 15 and 30 degrees C (59 and 86 degrees F). Keep bottle tightly closed and protect from moisture. Do not remove desiccant canister from bottle. Throw away any unused medicine after the expiration date. NOTE: This sheet is a summary. It may not cover all possible information. If you have questions about this medicine, talk to your doctor, pharmacist, or health care provider.  2018 Elsevier/Gold Standard (2015-02-06 10:18:26)

## 2017-09-09 NOTE — Progress Notes (Signed)
GYN ENCOUNTER NOTE  Subjective:       Jamie Ortega is a 32 y.o. [phone] female here for pregnancy confirmation.   Reports positive home pregnancy test. Endorses nausea with multiple episode of vomiting daily (no relief with home treatment measures), lower abdominal cramping, spotting, and breast tenderness.   Denies difficulty breathing or respiratory distress chest pain, dysuria, and leg pain or swelling.   Accompanied by spouse.    Gynecologic History  Patient's last menstrual period was 07/26/2017 (exact date).  Contraception: none  Last Pap: 08/2016. Results were: Negative/Negative  Obstetric History  OB History  Gravida Para Term Preterm AB Living  5 2 2  0 3 2  SAB TAB Ectopic Multiple Live Births  0 0 0 0 2    # Outcome Date GA Lbr Len/2nd Weight Sex Delivery Anes PTL Lv  5 Term      Vag-Spont   LIV  4 Term      Vag-Spont   LIV  3 AB           2 AB           1 AB             Past Medical History:  Diagnosis Date  . Genital herpes     Past Surgical History:  Procedure Laterality Date  . WISDOM TOOTH EXTRACTION      Current Outpatient Medications on File Prior to Visit  Medication Sig Dispense Refill  . Prenatal Vit-Fe Fumarate-FA (PRENATAL MULTIVITAMIN) TABS tablet Take 1 tablet by mouth daily at 12 noon.    . valACYclovir (VALTREX) 1000 MG tablet Take 1 tablet (1,000 mg total) by mouth 2 (two) times daily. (Patient not taking: Reported on 05/20/2017) 14 tablet 6   No current facility-administered medications on file prior to visit.     No Known Allergies  Social History   Socioeconomic History  . Marital status: Married    Spouse name: Not on file  . Number of children: 2  . Years of education: Not on file  . Highest education level: Not on file  Occupational History  . Not on file  Social Needs  . Financial resource strain: Not on file  . Food insecurity:    Worry: Not on file    Inability: Not on file  . Transportation needs:   Medical: Not on file    Non-medical: Not on file  Tobacco Use  . Smoking status: Never Smoker  . Smokeless tobacco: Never Used  Substance and Sexual Activity  . Alcohol use: Yes    Comment: Socially  . Drug use: No  . Sexual activity: Yes    Partners: Male    Birth control/protection: Condom  Lifestyle  . Physical activity:    Days per week: Not on file    Minutes per session: Not on file  . Stress: Not on file  Relationships  . Social connections:    Talks on phone: Not on file    Gets together: Not on file    Attends religious service: Not on file    Active member of club or organization: Not on file    Attends meetings of clubs or organizations: Not on file    Relationship status: Not on file  . Intimate partner violence:    Fear of current or ex partner: Not on file    Emotionally abused: Not on file    Physically abused: Not on file    Forced sexual activity: Not on  file  Other Topics Concern  . Not on file  Social History Narrative   Married.   2 children.    Works at [employer] [place].    Enjoys kayaking, spending time outdoors.     Family History  Problem Relation Age of Onset  . Cervical cancer Mother   . Endometriosis Mother   . Arthritis Mother   . Neuropathy Mother   . Hypertension Mother   . Anxiety disorder Mother   . Post-traumatic stress disorder Mother   . Hyperlipidemia Father   . Rheum arthritis Maternal Grandmother   . COPD Maternal Grandmother   . Emphysema Maternal Grandmother   . Prostate cancer Maternal Grandfather   . Cancer Paternal Grandfather        Jaw    The following portions of the patient's history were reviewed and updated as appropriate: allergies, current medications, past family history, past medical history, past social history, past surgical history and problem list.  Review of Systems  ROS negative except as noted above. Information obtained from patient.   Objective:   BP 110/79   Pulse 76   Ht 5\' 2"  (1.575 m)    Wt 209 lb 8 oz (95 kg)   LMP 07/26/2017 (Exact Date)   BMI 38.32 kg/m   CONSTITUTIONAL: Well-developed, well-nourished female in no acute distress.   POSITIVE UPT  ULTRASOUND REPORT  Location: [facility] Date of Service:  09/09/2017  Indications: Dating/Viability Findings:  Nelda [place] intrauterine pregnancy is visualized with a CRL consistent with 6 1/[redacted] weeks gestation, giving an (U/S) EDD of 05/04/18. A clinical EDD has not been established.  A small [place] is noted measuring 0.3 x 0.9 x 0.5 cm.  An anterior, left fundal fibroid is noted measuring 3.5 x 3.8 x 3.2 cm.  FHR: 113 BPM CRL measurement: 4.1 mm Yolk sac and early anatomy is normal.  Right Ovary measures 3.0 x 2.6 x 2.4 cm. It is normal in appearance. Left Ovary measures 2.8 x 1.6 x 1.2 cm. It is normal appearance. There is evidence of a corpus luteal cyst in the right ovary. Survey of the adnexa demonstrates no adnexal masses. There is no free peritoneal fluid in the cul de sac.  Impression: 1. 6 1/7 week Viable [place] Intrauterine pregnancy by U/S. 2. A clinical EDD has not been established. 3. Anterior, left fundal fibroid. 4. Small [place].  Recommendations: 1.Clinical correlation with the patient's History and Physical Exam. 2. Today's ultrasound should be used to establish EDD of 05/04/18.  Assessment:   1. Amenorrhea  - POCT urine pregnancy  2. Spotting in early pregnancy   3. Cramping affecting pregnancy, antepartum   4. Nausea/vomiting in pregnancy   Plan:   Ultrasound findings reviewed with patient, verbalized understanding.   First trimester education, see AVS.   Reviewed red flag symptoms and when to call.   RTC x 2-3 weeks for nurse intake, RTC x 6 weeks for NOB Physical.    Diona Fanti, CNM [facility], [facility]

## 2017-09-16 ENCOUNTER — Encounter: Payer: Self-pay | Admitting: Emergency Medicine

## 2017-09-16 ENCOUNTER — Emergency Department: Payer: BLUE CROSS/BLUE SHIELD

## 2017-09-16 ENCOUNTER — Other Ambulatory Visit: Payer: Self-pay

## 2017-09-16 ENCOUNTER — Emergency Department
Admission: EM | Admit: 2017-09-16 | Discharge: 2017-09-16 | Disposition: A | Discharge status: Home / Self Care | Payer: BLUE CROSS/BLUE SHIELD | Attending: Emergency Medicine | Admitting: Emergency Medicine

## 2017-09-16 ENCOUNTER — Telehealth: Payer: Self-pay | Admitting: Certified Nurse Midwife

## 2017-09-16 DIAGNOSIS — O468X1 Other antepartum hemorrhage, first trimester: Secondary | ICD-10-CM

## 2017-09-16 DIAGNOSIS — O208 Other hemorrhage in early pregnancy: Secondary | ICD-10-CM | POA: Diagnosis not present

## 2017-09-16 DIAGNOSIS — Z3A01 Less than 8 weeks gestation of pregnancy: Secondary | ICD-10-CM | POA: Insufficient documentation

## 2017-09-16 DIAGNOSIS — O219 Vomiting of pregnancy, unspecified: Secondary | ICD-10-CM | POA: Diagnosis not present

## 2017-09-16 DIAGNOSIS — O210 Mild hyperemesis gravidarum: Secondary | ICD-10-CM

## 2017-09-16 DIAGNOSIS — O200 Threatened abortion: Secondary | ICD-10-CM | POA: Diagnosis not present

## 2017-09-16 DIAGNOSIS — O418X10 Other specified disorders of amniotic fluid and membranes, first trimester, not applicable or unspecified: Secondary | ICD-10-CM

## 2017-09-16 DIAGNOSIS — O4690 Antepartum hemorrhage, unspecified, unspecified trimester: Secondary | ICD-10-CM | POA: Diagnosis not present

## 2017-09-16 LAB — CBC WITH DIFFERENTIAL/PLATELET
BASOS ABS: 0.1 10*3/uL (ref 0–0.1)
Basophils Relative: 1 %
EOS ABS: 0 10*3/uL (ref 0–0.7)
EOS PCT: 0 %
HCT: 37.7 % (ref 35.0–47.0)
HEMOGLOBIN: 13.4 g/dL (ref 12.0–16.0)
LYMPHS PCT: 16 %
Lymphs Abs: 1.7 10*3/uL (ref 1.0–3.6)
MCH: 32.2 pg (ref 26.0–34.0)
MCHC: 35.5 g/dL (ref 32.0–36.0)
MCV: 90.8 fL (ref 80.0–100.0)
Monocytes Absolute: 0.6 10*3/uL (ref 0.2–0.9)
Monocytes Relative: 5 %
NEUTROS PCT: 78 %
Neutro Abs: 8.2 10*3/uL — ABNORMAL HIGH (ref 1.4–6.5)
PLATELETS: 295 10*3/uL (ref 150–440)
RBC: 4.15 MIL/uL (ref 3.80–5.20)
RDW: 13.3 % (ref 11.5–14.5)
WBC: 10.5 10*3/uL (ref 3.6–11.0)

## 2017-09-16 LAB — BASIC METABOLIC PANEL
ANION GAP: 10 (ref 5–15)
BUN: 8 mg/dL (ref 6–20)
CHLORIDE: 103 mmol/L (ref 98–111)
CO2: 24 mmol/L (ref 22–32)
Calcium: 9.1 mg/dL (ref 8.9–10.3)
Creatinine, Ser: 0.69 mg/dL (ref 0.44–1.00)
GFR calc Af Amer: >60 mL/min (ref >60)
Glucose, Bld: 88 mg/dL (ref 70–99)
POTASSIUM: 3.7 mmol/L (ref 3.5–5.1)
SODIUM: 137 mmol/L (ref 135–145)

## 2017-09-16 LAB — ABO/RH: ABO/RH(D): A POS

## 2017-09-16 LAB — HCG, QUANTITATIVE, PREGNANCY: HCG, BETA CHAIN, QUANT, S: 125484 m[IU]/mL — AB (ref <5)

## 2017-09-16 MED ORDER — METOCLOPRAMIDE HCL 10 MG PO TABS: 10.0000 mg | ORAL_TABLET | Freq: Four times a day (QID) | ORAL | 0 refills | Status: DC | PRN

## 2017-09-16 MED ORDER — METOCLOPRAMIDE HCL 10 MG PO TABS
10.0000 mg | ORAL_TABLET | Freq: Once | ORAL | Status: AC
  Administered 2017-09-16: 10 mg via ORAL
  Filled 2017-09-16: qty 1

## 2017-09-16 NOTE — ED Notes (Signed)
Pt to US at this time.

## 2017-09-16 NOTE — ED Provider Notes (Signed)
[facility] Emergency Department Provider Note  ____________________________________________  Time seen: Approximately 7:06 PM  I have reviewed the triage vital signs and the nursing notes.   HISTORY  Chief Complaint Vaginal Bleeding    HPI Jamie Ortega is a 32 y.o. female with a history of obesity and genital herpes and current pregnancy (G6, P2 A3) who complains of nausea and vomiting since yesterday with difficulty tolerating any oral intake.  She has had trouble with morning sickness throughout this pregnancy and currently takes Zantac and likely just.  [facility].  Also reports that she has been having some vaginal bleeding today.  She had vaginal spotting a week ago and had an ultrasound in the ED which did show a live IUP at that time.  Denies fevers chills sweats chest pain or shortness of breath.  Symptoms are intermittent, waxing and waning, no aggravating or alleviating factors.      Past Medical History:  Diagnosis Date  . Genital herpes      Patient Active Problem List   Diagnosis Date Noted  . Uterine leiomyoma 09/09/2017  . Genital herpes 05/20/2017  . Obesity (BMI 35.0-39.9 without comorbidity) 05/20/2017     Past Surgical History:  Procedure Laterality Date  . WISDOM TOOTH EXTRACTION       Prior to Admission medications   Medication Sig Start Date End Date Taking? Authorizing Provider  Doxylamine-Pyridoxine (DICLEGIS) 10-10 MG TBEC Take 2 tablets by mouth at bedtime. If symptoms persist, add one tablet in the morning and one in the afternoon 09/09/17  Yes Lawhorn, Lara Mulch, CNM  Prenatal Vit-Fe Fumarate-FA (PRENATAL MULTIVITAMIN) TABS tablet Take 1 tablet by mouth daily at 12 noon.   Yes [provider]  ranitidine (ZANTAC) 150 MG tablet Take 1 tablet (150 mg total) by mouth 2 (two) times daily. 09/09/17  Yes Lawhorn, Lara Mulch, CNM  metoCLOPramide (REGLAN) 10 MG tablet  Take 1 tablet (10 mg total) by mouth every 6 (six) hours as needed. 09/16/17   Carrie Mew, MD  valACYclovir (VALTREX) 1000 MG tablet Take 1 tablet (1,000 mg total) by mouth 2 (two) times daily. Patient not taking: Reported on 05/20/2017 08/23/16   Emily Filbert, MD     Allergies Patient has no known allergies.   Family History  Problem Relation Age of Onset  . Cervical cancer Mother   . Endometriosis Mother   . Arthritis Mother   . Neuropathy Mother   . Hypertension Mother   . Anxiety disorder Mother   . Post-traumatic stress disorder Mother   . Hyperlipidemia Father   . Rheum arthritis Maternal Grandmother   . COPD Maternal Grandmother   . Emphysema Maternal Grandmother   . Prostate cancer Maternal Grandfather   . Cancer Paternal Grandfather        Jaw    Social History Social History   Tobacco Use  . Smoking status: Never Smoker  . Smokeless tobacco: Never Used  Substance Use Topics  . Alcohol use: Yes    Comment: Socially  . Drug use: No    Review of Systems  Constitutional:   No fever or chills.  ENT:   No sore throat. No rhinorrhea. Cardiovascular:   No chest pain or syncope. Respiratory:   No dyspnea or cough. Gastrointestinal:   Negative for abdominal pain, vomiting and diarrhea.  Musculoskeletal:   Negative for focal pain or swelling All other systems reviewed and are negative except as documented above in  ROS and HPI.  ____________________________________________   PHYSICAL EXAM:  VITAL SIGNS: ED Triage Vitals  Enc Vitals Group     BP 09/16/17 1408 121/72     Pulse Rate 09/16/17 1408 72     Resp 09/16/17 1408 18     Temp 09/16/17 1408 99.1 F (37.3 C)     Temp Source 09/16/17 1408 Oral     SpO2 09/16/17 1408 98 %     Weight 09/16/17 1408 209 lb (94.8 kg)     Height 09/16/17 1408 5\' 2"  (1.575 m)     Head Circumference --      Peak Flow --      Pain Score 09/16/17 1410 0     Pain Loc --      Pain Edu? --      Excl. in [place]? --     Vital  signs reviewed, nursing assessments reviewed.   Constitutional:   Alert and oriented. Non-toxic appearance. Eyes:   Conjunctivae are normal. EOMI. PERRL. ENT      Head:   Normocephalic and atraumatic.      Nose:   No congestion/rhinnorhea.       Mouth/Throat:   MMM, no pharyngeal erythema. No peritonsillar mass.       Neck:   No meningismus. Full ROM. Hematological/Lymphatic/Immunilogical:   No cervical lymphadenopathy. Cardiovascular:   RRR. Symmetric bilateral radial and DP pulses.  No murmurs. Cap refill less than 2 seconds. Respiratory:   Normal respiratory effort without tachypnea/retractions. Breath sounds are clear and equal bilaterally. No wheezes/rales/rhonchi. Gastrointestinal:   Soft and nontender. Non distended. There is no CVA tenderness.  No rebound, rigidity, or guarding. Musculoskeletal:   Normal range of motion in all extremities. No joint effusions.  No lower extremity tenderness.  No edema. Neurologic:   Normal speech and language.  Motor grossly intact. No acute focal neurologic deficits are appreciated.  Skin:    Skin is warm, dry and intact. No rash noted.  No petechiae, purpura, or bullae.  ____________________________________________    LABS (pertinent positives/negatives) (all labs ordered are listed, but only abnormal results are displayed) Labs Reviewed  HCG, QUANTITATIVE, PREGNANCY - Abnormal; Notable for the following components:      Result Value   hCG, Beta Chain, Quant, S [id number] (*)    All other components within normal limits  CBC WITH DIFFERENTIAL/PLATELET - Abnormal; Notable for the following components:   Neutro Abs 8.2 (*)    All other components within normal limits  BASIC METABOLIC PANEL  ABO/RH   ____________________________________________   EKG    ____________________________________________    RADIOLOGY  US Ob Comp Less 14 Wks  Result Date: 09/16/2017 CLINICAL DATA:  First-trimester pregnancy.  Vaginal bleeding. EXAM:  OBSTETRIC <14 WK [place] AND TRANSVAGINAL OB US TECHNIQUE: Both transabdominal and transvaginal ultrasound examinations were performed for complete evaluation of the gestation as well as the maternal uterus, adnexal regions, and pelvic cul-de-sac. Transvaginal technique was performed to assess early pregnancy. COMPARISON:  None. FINDINGS: Intrauterine gestational sac: Single Yolk sac:  Present Embryo:  Present Cardiac Activity: Present Heart Rate: 153 bpm CRL: 9.3 mm   7 w   0 d                  [place] EDC: 05/05/2018 Subchorionic hemorrhage: A small subchorionic hemorrhage is noted along the inferior aspect of the gestational sac. Maternal uterus/adnexae: Probable corpus luteal cyst is noted in the right ovary. Adnexa are otherwise unremarkable. IMPRESSION: 1. Single intrauterine pregnancy  with a crown-rump length of 9.3 mm provided estimated gestational age of [redacted] weeks and 0 days. 2. Small subchorionic hemorrhage inferior to the gestational sac likely accounts for vaginal bleeding. Electronically Signed   By: San Morelle M.D.   On: 09/16/2017 17:02   US Ob Transvaginal  Result Date: 09/16/2017 CLINICAL DATA:  First-trimester pregnancy.  Vaginal bleeding. EXAM: OBSTETRIC <14 WK [place] AND TRANSVAGINAL OB US TECHNIQUE: Both transabdominal and transvaginal ultrasound examinations were performed for complete evaluation of the gestation as well as the maternal uterus, adnexal regions, and pelvic cul-de-sac. Transvaginal technique was performed to assess early pregnancy. COMPARISON:  None. FINDINGS: Intrauterine gestational sac: Single Yolk sac:  Present Embryo:  Present Cardiac Activity: Present Heart Rate: 153 bpm CRL: 9.3 mm   7 w   0 d                  [place] EDC: 05/05/2018 Subchorionic hemorrhage: A small subchorionic hemorrhage is noted along the inferior aspect of the gestational sac. Maternal uterus/adnexae: Probable corpus luteal cyst is noted in the right ovary. Adnexa are otherwise unremarkable. IMPRESSION: 1.  Single intrauterine pregnancy with a crown-rump length of 9.3 mm provided estimated gestational age of [redacted] weeks and 0 days. 2. Small subchorionic hemorrhage inferior to the gestational sac likely accounts for vaginal bleeding. Electronically Signed   By: San Morelle M.D.   On: 09/16/2017 17:02    ____________________________________________   PROCEDURES Procedures  ____________________________________________    CLINICAL IMPRESSION / ASSESSMENT AND PLAN / ED COURSE  Pertinent labs & imaging results that were available during my care of the patient were reviewed by me and considered in my medical decision making (see chart for details).    Patient presents with vaginal bleeding and nausea and vomiting of early pregnancy.  Vital signs are normal, exam is normal.  Ultrasound shows a live IUP still, small subchorionic hemorrhage which explains her bleeding.  Counseled her on threatened miscarriage.  No evidence of ectopic/heterotopic pregnancy.  Doubt STI PID, no evidence of TOA or torsion.  Her likely just is not effective for her morning sickness it seems to have recommended that we start her on Reglan and given her information about hyperemesis eating plan.  [facility].      ____________________________________________   FINAL CLINICAL IMPRESSION(S) / ED DIAGNOSES    Final diagnoses:  Threatened miscarriage  Subchorionic hematoma in first trimester, single or unspecified fetus  Morning sickness     ED Discharge Orders         Ordered    metoCLOPramide (REGLAN) 10 MG tablet  Every 6 hours PRN     09/16/17 1905          Portions of this note were generated with dragon dictation software. Dictation errors may occur despite best attempts at proofreading.    Carrie Mew, MD 09/16/17 [phone]

## 2017-09-16 NOTE — Telephone Encounter (Signed)
The patient is [redacted] wks pregnant, has seen Sharyn Lull once and is asking about a note for work because she is having emesis 4 to 5 times per day and it is making it hard to work.  Please advise, thanks.

## 2017-09-16 NOTE — ED Triage Notes (Signed)
Here for vaginal spotting.  [redacted] weeks pregnant.  [id number].  Has been vomiting since yesterday as well. VSS.  No vomiting in triage.

## 2017-09-16 NOTE — Discharge Instructions (Signed)
Results for orders placed or performed during the hospital encounter of 09/16/17  hCG, quantitative, pregnancy  Result Value Ref Range   hCG, Beta Chain, Quant, S 125,484 (H) <5 mIU/mL  CBC with Differential  Result Value Ref Range   WBC 10.5 3.6 - 11.0 K/uL   RBC 4.15 3.80 - 5.20 MIL/uL   Hemoglobin 13.4 12.0 - 16.0 g/dL   HCT 37.7 35.0 - 47.0 %   MCV 90.8 80.0 - 100.0 fL   MCH 32.2 26.0 - 34.0 pg   MCHC 35.5 32.0 - 36.0 g/dL   RDW 13.3 11.5 - 14.5 %   Platelets 295 150 - 440 K/uL   Neutrophils Relative % 78 %   Neutro Abs 8.2 (H) 1.4 - 6.5 K/uL   Lymphocytes Relative 16 %   Lymphs Abs 1.7 1.0 - 3.6 K/uL   Monocytes Relative 5 %   Monocytes Absolute 0.6 0.2 - 0.9 K/uL   Eosinophils Relative 0 %   Eosinophils Absolute 0.0 0 - 0.7 K/uL   Basophils Relative 1 %   Basophils Absolute 0.1 0 - 0.1 K/uL  Basic metabolic panel  Result Value Ref Range   Sodium 137 135 - 145 mmol/L   Potassium 3.7 3.5 - 5.1 mmol/L   Chloride 103 98 - 111 mmol/L   CO2 24 22 - 32 mmol/L   Glucose, Bld 88 70 - 99 mg/dL   BUN 8 6 - 20 mg/dL   Creatinine, Ser 0.69 0.44 - 1.00 mg/dL   Calcium 9.1 8.9 - 10.3 mg/dL   GFR calc non Af Amer >60 >60 mL/min   GFR calc Af Amer >60 >60 mL/min   Anion gap 10 5 - 15  ABO/Rh  Result Value Ref Range   ABO/RH(D)      A POS Performed at [facility], [address], [place],  [address]    US Ob Comp Less 14 Wks  Result Date: 09/16/2017 CLINICAL DATA:  First-trimester pregnancy.  Vaginal bleeding. EXAM: OBSTETRIC <14 WK [place] AND TRANSVAGINAL OB US TECHNIQUE: Both transabdominal and transvaginal ultrasound examinations were performed for complete evaluation of the gestation as well as the maternal uterus, adnexal regions, and pelvic cul-de-sac. Transvaginal technique was performed to assess early pregnancy. COMPARISON:  None. FINDINGS: Intrauterine gestational sac: Single Yolk sac:  Present Embryo:  Present Cardiac Activity: Present Heart Rate: 153 bpm  CRL: 9.3 mm   7 w   0 d                  [place] EDC: 05/05/2018 Subchorionic hemorrhage: A small subchorionic hemorrhage is noted along the inferior aspect of the gestational sac. Maternal uterus/adnexae: Probable corpus luteal cyst is noted in the right ovary. Adnexa are otherwise unremarkable. IMPRESSION: 1. Single intrauterine pregnancy with a crown-rump length of 9.3 mm provided estimated gestational age of [redacted] weeks and 0 days. 2. Small subchorionic hemorrhage inferior to the gestational sac likely accounts for vaginal bleeding. Electronically Signed   By: San Morelle M.D.   On: 09/16/2017 17:02   US Ob Transvaginal  Result Date: 09/16/2017 CLINICAL DATA:  First-trimester pregnancy.  Vaginal bleeding. EXAM: OBSTETRIC <14 WK [place] AND TRANSVAGINAL OB US TECHNIQUE: Both transabdominal and transvaginal ultrasound examinations were performed for complete evaluation of the gestation as well as the maternal uterus, adnexal regions, and pelvic cul-de-sac. Transvaginal technique was performed to assess early pregnancy. COMPARISON:  None. FINDINGS: Intrauterine gestational sac: Single Yolk sac:  Present Embryo:  Present Cardiac Activity: Present Heart  Rate: 153 bpm CRL: 9.3 mm   7 w   0 d                  [place] EDC: 05/05/2018 Subchorionic hemorrhage: A small subchorionic hemorrhage is noted along the inferior aspect of the gestational sac. Maternal uterus/adnexae: Probable corpus luteal cyst is noted in the right ovary. Adnexa are otherwise unremarkable. IMPRESSION: 1. Single intrauterine pregnancy with a crown-rump length of 9.3 mm provided estimated gestational age of [redacted] weeks and 0 days. 2. Small subchorionic hemorrhage inferior to the gestational sac likely accounts for vaginal bleeding. Electronically Signed   By: San Morelle M.D.   On: 09/16/2017 17:02   US Ob Transvaginal  Result Date: 09/12/2017 ULTRASOUND REPORT Location: [facility] Date of Service:  09/09/2017 Indications:  Dating/Viability Findings: Jamie Ortega intrauterine pregnancy is visualized with a CRL consistent with 6 1/[redacted] weeks gestation, giving an (U/S) EDD of 05/04/18. A clinical EDD has not been established. A small [place] is noted measuring 0.3 x 0.9 x 0.5 cm. An anterior, left fundal fibroid is noted measuring 3.5 x 3.8 x 3.2 cm. FHR: 113 BPM CRL measurement: 4.1 mm Yolk sac and early anatomy is normal. Right Ovary measures 3.0 x 2.6 x 2.4 cm. It is normal in appearance. Left Ovary measures 2.8 x 1.6 x 1.2 cm. It is normal appearance. There is evidence of a corpus luteal cyst in the right ovary. Survey of the adnexa demonstrates no adnexal masses. There is no free peritoneal fluid in the cul de sac. Impression: 1. 6 1/7 week Viable Singleton Intrauterine pregnancy by U/S. 2. A clinical EDD has not been established. 3. Anterior, left fundal fibroid. 4. Small [place]. Recommendations: 1.Clinical correlation with the patient's History and Physical Exam. 2. Today's ultrasound should be used to establish EDD of 05/04/18. Dario Ave, RDMS The ultrasound images and findings were reviewed by me and I agree with the above report. Finis Bud, M.D. 09/12/2017 9:28 AM

## 2017-09-22 ENCOUNTER — Other Ambulatory Visit: Payer: Self-pay

## 2017-09-22 MED ORDER — PROMETHAZINE HCL 25 MG PO TABS: 25.0000 mg | ORAL_TABLET | Freq: Four times a day (QID) | ORAL | 2 refills | Status: DC | PRN

## 2017-09-26 ENCOUNTER — Ambulatory Visit (INDEPENDENT_AMBULATORY_CARE_PROVIDER_SITE_OTHER): Payer: BLUE CROSS/BLUE SHIELD | Admitting: Certified Nurse Midwife

## 2017-09-26 ENCOUNTER — Ambulatory Visit (INDEPENDENT_AMBULATORY_CARE_PROVIDER_SITE_OTHER): Payer: BLUE CROSS/BLUE SHIELD

## 2017-09-26 ENCOUNTER — Other Ambulatory Visit: Payer: Self-pay | Admitting: Certified Nurse Midwife

## 2017-09-26 ENCOUNTER — Telehealth: Payer: Self-pay | Admitting: Certified Nurse Midwife

## 2017-09-26 VITALS — BP 102/69 | HR 79 | Ht 62.0 in | Wt 201.2 lb

## 2017-09-26 DIAGNOSIS — N926 Irregular menstruation, unspecified: Secondary | ICD-10-CM

## 2017-09-26 DIAGNOSIS — O26859 Spotting complicating pregnancy, unspecified trimester: Secondary | ICD-10-CM

## 2017-09-26 DIAGNOSIS — Z3201 Encounter for pregnancy test, result positive: Secondary | ICD-10-CM

## 2017-09-26 DIAGNOSIS — Z3A08 8 weeks gestation of pregnancy: Secondary | ICD-10-CM

## 2017-09-26 DIAGNOSIS — O209 Hemorrhage in early pregnancy, unspecified: Secondary | ICD-10-CM

## 2017-09-26 LAB — POCT URINALYSIS DIPSTICK
BILIRUBIN UA: NEGATIVE
GLUCOSE UA: NEGATIVE
KETONES UA: NEGATIVE
Leukocytes, UA: NEGATIVE
Nitrite, UA: NEGATIVE
Protein, UA: POSITIVE — AB
RBC UA: NEGATIVE
Urobilinogen, UA: 0.2 E.U./dL
pH, UA: 5 (ref 5.0–8.0)

## 2017-09-26 NOTE — Telephone Encounter (Signed)
The patient states she is very nauseous but will keep her appointment today.  Also, having bloody discharge, and asked about an [place].  Her last [place] was in the ED on 09/16/17.  The patient was told that would be up to her provider.  The patient acknowledged understanding.  Please be advised, thank you.

## 2017-09-26 NOTE — Progress Notes (Signed)
I have reviewed the record and concur with patient management and plan of care.    Sami Roes Michelle Camylle Whicker, CNM [facility] 

## 2017-09-26 NOTE — Progress Notes (Signed)
Jamie Ortega presents for NOB nurse interview visit. Pregnancy confirmation done _8/23/19 [facility]_____.  G- 6.  P-2 0 3 2    . Pregnancy education material explained and given. _0_ cats in the home. NOB labs ordered. (TSH/HbgA1c due to Increased BMI), (sickle cell). HIV labs and Drug screen were explained optional and she did not decline. Drug screen ordered. PNV encouraged. Genetic screening options discussed. Genetic testing: Ordered.  Pt may discuss with provider. Pt. To follow up with provider in _3_ weeks for NOB physical.  All questions answered.

## 2017-09-27 ENCOUNTER — Telehealth: Payer: Self-pay

## 2017-09-27 LAB — URINALYSIS, ROUTINE W REFLEX MICROSCOPIC
Bilirubin, UA: NEGATIVE
Glucose, UA: NEGATIVE
Leukocytes, UA: NEGATIVE
NITRITE UA: NEGATIVE
PH UA: 5.5 (ref 5.0–7.5)
RBC, UA: NEGATIVE
Specific Gravity, UA: >=1.03 — AB (ref 1.005–1.030)
Urobilinogen, Ur: 0.2 mg/dL (ref 0.2–1.0)

## 2017-09-27 LAB — VARICELLA ZOSTER ANTIBODY, IGG: Varicella zoster IgG: 828 index (ref >165)

## 2017-09-27 LAB — RPR: RPR Ser Ql: NONREACTIVE

## 2017-09-27 LAB — GC/CHLAMYDIA PROBE AMP
Chlamydia trachomatis, NAA: NEGATIVE
Neisseria gonorrhoeae by PCR: NEGATIVE

## 2017-09-27 LAB — HEPATITIS B SURFACE ANTIGEN: HEP B S AG: NEGATIVE

## 2017-09-27 LAB — TSH: TSH: 0.008 u[IU]/mL — ABNORMAL LOW (ref 0.450–4.500)

## 2017-09-27 LAB — RUBELLA SCREEN: Rubella Antibodies, IGG: 1.41 index (ref >0.99)

## 2017-09-27 LAB — HIV ANTIBODY (ROUTINE TESTING W REFLEX): HIV SCREEN 4TH GENERATION: NONREACTIVE

## 2017-09-27 LAB — ANTIBODY SCREEN: ANTIBODY SCREEN: NEGATIVE

## 2017-09-27 LAB — HEMOGLOBIN A1C
ESTIMATED AVERAGE GLUCOSE: 105 mg/dL
HEMOGLOBIN A1C: 5.3 % (ref 4.8–5.6)

## 2017-09-27 NOTE — Addendum Note (Signed)
Addended by: Raliegh Ip on: 09/27/2017 10:25 AM   Modules accepted: Orders

## 2017-09-27 NOTE — Telephone Encounter (Signed)
Thyroid panel added per ML.

## 2017-09-27 NOTE — Progress Notes (Signed)
Add panel to TSH please. Thanks, JML

## 2017-09-28 LAB — URINE CULTURE: Organism ID, Bacteria: NO GROWTH

## 2017-09-28 LAB — THYROID PANEL
Free Thyroxine Index: 5.1 — ABNORMAL HIGH (ref 1.2–4.9)
T3 UPTAKE RATIO: 24 % (ref 24–39)
T4, Total: 21.1 ug/dL (ref 4.5–12.0)

## 2017-09-28 LAB — SPECIMEN STATUS REPORT

## 2017-10-01 LAB — OPIATES CONFIRMATION, URINE: Opiates: NEGATIVE ng/mL

## 2017-10-01 LAB — MONITOR DRUG PROFILE 14(MW)
BARBITURATE SCREEN URINE: NEGATIVE ng/mL
BENZODIAZEPINE SCREEN, URINE: NEGATIVE ng/mL
BUPRENORPHINE, URINE: NEGATIVE ng/mL
CANNABINOIDS UR QL SCN: NEGATIVE ng/mL
CREATININE(CRT), U: 324.3 mg/dL — AB (ref 20.0–300.0)
Cocaine (Metab) Scrn, Ur: NEGATIVE ng/mL
FENTANYL, URINE: NEGATIVE pg/mL
METHADONE SCREEN, URINE: NEGATIVE ng/mL
Meperidine Screen, Urine: NEGATIVE ng/mL
OXYCODONE+OXYMORPHONE UR QL SCN: NEGATIVE ng/mL
PHENCYCLIDINE QUANTITATIVE URINE: NEGATIVE ng/mL
Ph of Urine: 5.8 (ref 4.5–8.9)
Propoxyphene Scrn, Ur: NEGATIVE ng/mL
SPECIFIC GRAVITY: 1.021
Tramadol Screen, Urine: NEGATIVE ng/mL

## 2017-10-01 LAB — AMPHETAMINE (GC/MS), URINE: Amphetamines: NEGATIVE

## 2017-10-01 LAB — NICOTINE SCREEN, URINE: COTININE UR QL SCN: NEGATIVE ng/mL

## 2017-10-06 ENCOUNTER — Telehealth: Payer: Self-pay | Admitting: Certified Nurse Midwife

## 2017-10-06 NOTE — Telephone Encounter (Signed)
The patients insurance [employer] called and stated that they needed to verify some information due to the patient being kept out of work. Please advise.

## 2017-10-07 ENCOUNTER — Telehealth: Payer: Self-pay

## 2017-10-07 NOTE — Telephone Encounter (Signed)
Message left for Jamie Ortega at [facility] regarding questions he has.

## 2017-10-12 ENCOUNTER — Telehealth: Payer: Self-pay

## 2017-10-12 NOTE — Telephone Encounter (Signed)
Unable to get through to Gonzalez at [facility] [phone] ext [phone]. Unable to leave message

## 2017-10-24 ENCOUNTER — Encounter: Payer: Self-pay | Admitting: Certified Nurse Midwife

## 2017-10-24 ENCOUNTER — Ambulatory Visit (INDEPENDENT_AMBULATORY_CARE_PROVIDER_SITE_OTHER): Payer: BLUE CROSS/BLUE SHIELD | Admitting: Certified Nurse Midwife

## 2017-10-24 ENCOUNTER — Other Ambulatory Visit (HOSPITAL_COMMUNITY)
Admission: RE | Admit: 2017-10-24 | Discharge: 2017-10-24 | Disposition: A | Discharge status: Home / Self Care | Payer: BLUE CROSS/BLUE SHIELD | Source: Ambulatory Visit | Attending: Certified Nurse Midwife | Admitting: Certified Nurse Midwife

## 2017-10-24 VITALS — BP 103/74 | HR 87 | Wt 197.4 lb

## 2017-10-24 DIAGNOSIS — O26899 Other specified pregnancy related conditions, unspecified trimester: Secondary | ICD-10-CM | POA: Diagnosis not present

## 2017-10-24 DIAGNOSIS — K219 Gastro-esophageal reflux disease without esophagitis: Secondary | ICD-10-CM

## 2017-10-24 DIAGNOSIS — N898 Other specified noninflammatory disorders of vagina: Secondary | ICD-10-CM | POA: Diagnosis not present

## 2017-10-24 DIAGNOSIS — Z3A12 12 weeks gestation of pregnancy: Secondary | ICD-10-CM

## 2017-10-24 DIAGNOSIS — O9989 Other specified diseases and conditions complicating pregnancy, childbirth and the puerperium: Secondary | ICD-10-CM

## 2017-10-24 DIAGNOSIS — R7989 Other specified abnormal findings of blood chemistry: Secondary | ICD-10-CM

## 2017-10-24 DIAGNOSIS — O99619 Diseases of the digestive system complicating pregnancy, unspecified trimester: Secondary | ICD-10-CM

## 2017-10-24 DIAGNOSIS — Z3481 Encounter for supervision of other normal pregnancy, first trimester: Secondary | ICD-10-CM

## 2017-10-24 DIAGNOSIS — O99611 Diseases of the digestive system complicating pregnancy, first trimester: Secondary | ICD-10-CM

## 2017-10-24 DIAGNOSIS — O219 Vomiting of pregnancy, unspecified: Secondary | ICD-10-CM

## 2017-10-24 NOTE — Patient Instructions (Signed)
Morning Sickness Morning sickness is when you feel sick to your stomach (nauseous) during pregnancy. You may feel sick to your stomach and throw up (vomit). You may feel sick in the morning, but you can feel this way any time of day. Some women feel very sick to their stomach and cannot stop throwing up (hyperemesis gravidarum). Follow these instructions at home:  Only take medicines as told by your doctor.  Take multivitamins as told by your doctor. Taking multivitamins before getting pregnant can stop or lessen the harshness of morning sickness.  Eat dry toast or unsalted crackers before getting out of bed.  Eat 5 to 6 small meals a day.  Eat dry and bland foods like rice and baked potatoes.  Do not drink liquids with meals. Drink between meals.  Do not eat greasy, fatty, or spicy foods.  Have someone cook for you if the smell of food causes you to feel sick or throw up.  If you feel sick to your stomach after taking prenatal vitamins, take them at night or with a snack.  Eat protein when you need a snack (nuts, yogurt, cheese).  Eat unsweetened gelatins for dessert.  Wear a bracelet used for sea sickness (acupressure wristband).  Go to a doctor that puts thin needles into certain body points (acupuncture) to improve how you feel.  Do not smoke.  Use a humidifier to keep the air in your house free of odors.  Get lots of fresh air. Contact a doctor if:  You need medicine to feel better.  You feel dizzy or lightheaded.  You are losing weight. Get help right away if:  You feel very sick to your stomach and cannot stop throwing up.  You pass out (faint). This information is not intended to replace advice given to you by your health care provider. Make sure you discuss any questions you have with your health care provider. Document Released: 02/12/2004 Document Revised: 06/12/2015 Document Reviewed: 06/21/2012 Elsevier Interactive Patient Education  2017 [place]. Common Medications Safe in Pregnancy  Acne:      Constipation:  Benzoyl Peroxide     Colace  Clindamycin      Dulcolax Suppository  Topica Erythromycin     Fibercon  Salicylic Acid      Metamucil         Miralax AVOID:        Senakot   Accutane    Cough:  Retin-A       Cough Drops  Tetracycline      Phenergan w/ Codeine if Rx  Minocycline      Robitussin (Plain & DM)  Antibiotics:     Crabs/Lice:  Ceclor       RID  Cephalosporins    AVOID:  E-Mycins      Kwell  Keflex  Macrobid/Macrodantin   Diarrhea:  Penicillin      Kao-Pectate  Zithromax      Imodium AD         PUSH FLUIDS AVOID:       Cipro     Fever:  Tetracycline      Tylenol (Regular or Extra  Minocycline       Strength)  Levaquin      Extra Strength-Do not          Exceed 8 tabs/24 hrs Caffeine:        <226m/day (equiv. To 1 cup of coffee or  approx. 3 12 oz sodas)  Gas: Cold/Hayfever:       Gas-X  Benadryl      Mylicon  Claritin       Phazyme  **Claritin-D        Chlor-Trimeton    Headaches:  Dimetapp      ASA-Free Excedrin  Drixoral-Non-Drowsy     Cold Compress  Mucinex (Guaifenasin)     Tylenol (Regular or Extra  Sudafed/Sudafed-12 Hour     Strength)  **Sudafed PE Pseudoephedrine   Tylenol Cold & Sinus     Vicks Vapor Rub  Zyrtec  **AVOID if Problems With Blood Pressure         Heartburn: Avoid lying down for at least 1 hour after meals  Aciphex      Maalox     Rash:  Milk of Magnesia     Benadryl    Mylanta       1% Hydrocortisone Cream  Pepcid  Pepcid Complete   Sleep Aids:  Prevacid      Ambien   Prilosec       Benadryl  Rolaids       Chamomile Tea  Tums (Limit 4/day)     Unisom  Zantac       Tylenol PM         Warm milk-add vanilla or  Hemorrhoids:       Sugar for taste  Anusol/Anusol H.C.  (RX: Analapram 2.5%)  Sugar Substitutes:  Hydrocortisone OTC     Ok in moderation  Preparation H      Tucks        Vaseline lotion applied to tissue with  wiping    Herpes:     Throat:  Acyclovir      Oragel  Famvir  Valtrex     Vaccines:         Flu Shot Leg Cramps:       *Gardasil  Benadryl      Hepatitis A         Hepatitis B Nasal Spray:       Pneumovax  Saline Nasal Spray     Polio Booster         Tetanus Nausea:       Tuberculosis test or PPD  Vitamin B6 25 mg TID   AVOID:    Dramamine      *Gardasil  Emetrol       Live Poliovirus  Ginger Root 250 mg QID    MMR (measles, mumps &  High Complex Carbs @ Bedtime    rebella)  Sea Bands-Accupressure    Varicella (Chickenpox)  Unisom 1/2 tab TID     *No known complications           If received before Pain:         Known pregnancy;   Darvocet       Resume series after  Lortab        Delivery  Percocet    Yeast:   Tramadol      Femstat  Tylenol 3      Gyne-lotrimin  Ultram       Monistat  Vicodin           MISC:         All Sunscreens           Hair Coloring/highlights          Insect Repellant's          (Including DEET)         Mystic Tans  Second Trimester of Pregnancy The second trimester is from week 13 through week 28, month 4 through 6. This is often the time in pregnancy that you feel your best. Often times, morning sickness has lessened or quit. You may have more energy, and you may get hungry more often. Your unborn baby (fetus) is growing rapidly. At the end of the sixth month, he or she is about 9 inches long and weighs about 1 pounds. You will likely feel the baby move (quickening) between 18 and 20 weeks of pregnancy. Follow these instructions at home:  Avoid all smoking, herbs, and alcohol. Avoid drugs not approved by your doctor.  Do not use any tobacco products, including cigarettes, chewing tobacco, and electronic cigarettes. If you need help quitting, ask your doctor. You may get counseling or other support to help you quit.  Only take medicine as told by your doctor. Some medicines are safe and some are not during pregnancy.  Exercise only as told by your  doctor. Stop exercising if you start having cramps.  Eat regular, healthy meals.  Wear a good support bra if your breasts are tender.  Do not use hot tubs, steam rooms, or saunas.  Wear your seat belt when driving.  Avoid raw meat, uncooked cheese, and liter boxes and soil used by cats.  Take your prenatal vitamins.  Take 1500-2000 milligrams of calcium daily starting at the 20th week of pregnancy until you deliver your baby.  Try taking medicine that helps you poop (stool softener) as needed, and if your doctor approves. Eat more fiber by eating fresh fruit, vegetables, and whole grains. Drink enough fluids to keep your pee (urine) clear or pale yellow.  Take warm water baths (sitz baths) to soothe pain or discomfort caused by hemorrhoids. Use hemorrhoid cream if your doctor approves.  If you have puffy, bulging veins (varicose veins), wear support hose. Raise (elevate) your feet for 15 minutes, 3-4 times a day. Limit salt in your diet.  Avoid heavy lifting, wear low heals, and sit up straight.  Rest with your legs raised if you have leg cramps or low back pain.  Visit your dentist if you have not gone during your pregnancy. Use a soft toothbrush to brush your teeth. Be gentle when you floss.  You can have sex (intercourse) unless your doctor tells you not to.  Go to your doctor visits. Get help if:  You feel dizzy.  You have mild cramps or pressure in your lower belly (abdomen).  You have a nagging pain in your belly area.  You continue to feel sick to your stomach (nauseous), throw up (vomit), or have watery poop (diarrhea).  You have bad smelling fluid coming from your vagina.  You have pain with peeing (urination). Get help right away if:  You have a fever.  You are leaking fluid from your vagina.  You have spotting or bleeding from your vagina.  You have severe belly cramping or pain.  You lose or gain weight rapidly.  You have trouble catching your  breath and have chest pain.  You notice sudden or extreme puffiness (swelling) of your face, hands, ankles, feet, or legs.  You have not felt the baby move in over an hour.  You have severe headaches that do not go away with medicine.  You have vision changes. This information is not intended to replace advice given to you by your health care provider. Make sure you discuss any questions you have with your health  care provider. Document Released: 03/31/2009 Document Revised: 06/12/2015 Document Reviewed: 03/07/2012 Elsevier Interactive Patient Education  2017 [place] for Pregnant Women While you are pregnant, your body will require additional nutrition to help support your growing baby. It is recommended that you consume:  150 additional calories each day during your first trimester.  300 additional calories each day during your second trimester.  300 additional calories each day during your third trimester.  Eating a healthy, well-balanced diet is very important for your health and for your baby's health. You also have a higher need for some vitamins and minerals, such as folic acid, calcium, iron, and vitamin D. What do I need to know about eating during pregnancy?  Do not try to lose weight or go on a diet during pregnancy.  Choose healthy, nutritious foods. Choose  of a sandwich with a glass of milk instead of a candy bar or a high-calorie sugar-sweetened beverage.  Limit your overall intake of foods that have "empty calories." These are foods that have little nutritional value, such as sweets, desserts, candies, sugar-sweetened beverages, and fried foods.  Eat a variety of foods, especially fruits and vegetables.  Take a prenatal vitamin to help meet the additional needs during pregnancy, specifically for folic acid, iron, calcium, and vitamin D.  Remember to stay active. Ask your health care provider for exercise recommendations that are specific to  you.  Practice good food safety and cleanliness, such as washing your hands before you eat and after you prepare raw meat. This helps to prevent foodborne illnesses, such as listeriosis, that can be very dangerous for your baby. Ask your health care provider for more information about listeriosis. What does 150 extra calories look like? Healthy options for an additional 150 calories each day could be any of the following:  Plain low-fat yogurt (6-8 oz) with  cup of berries.  1 apple with 2 teaspoons of peanut butter.  Cut-up vegetables with  cup of hummus.  Low-fat chocolate milk (8 oz or 1 cup).  1 string cheese with 1 medium orange.   of a peanut butter and jelly sandwich on whole-wheat bread (1 tsp of peanut butter).  For 300 calories, you could eat two of those healthy options each day. What is a healthy amount of weight to gain? The recommended amount of weight for you to gain is based on your pre-pregnancy BMI. If your pre-pregnancy BMI was:  Less than 18 (underweight), you should gain 28-40 lb.  18-24.9 (normal), you should gain 25-35 lb.  25-29.9 (overweight), you should gain 15-25 lb.  Greater than 30 (obese), you should gain 11-20 lb.  What if I am having twins or multiples? Generally, pregnant women who will be having twins or multiples may need to increase their daily calories by 300-600 calories each day. The recommended range for total weight gain is 25-54 lb, depending on your pre-pregnancy BMI. Talk with your health care provider for specific guidance about additional nutritional needs, weight gain, and exercise during your pregnancy. What foods can I eat? Grains Any grains. Try to choose whole grains, such as whole-wheat bread, oatmeal, or brown rice. Vegetables Any vegetables. Try to eat a variety of colors and types of vegetables to get a full range of vitamins and minerals. Remember to wash your vegetables well before eating. Fruits Any fruits. Try to eat  a variety of colors and types of fruit to get a full range of vitamins and minerals. Remember to wash your fruits  well before eating. Meats and Other Protein Sources Lean meats, including chicken, [place], fish, and lean cuts of beef, veal, or pork. Make sure that all meats are cooked to "well done." Tofu. Tempeh. Beans. Eggs. Peanut butter and other nut butters. Seafood, such as shrimp, crab, and lobster. If you choose fish, select types that are higher in omega-3 fatty acids, including salmon, herring, mussels, trout, sardines, and pollock. Make sure that all meats are cooked to food-safe temperatures. Dairy Pasteurized milk and milk alternatives. Pasteurized yogurt and pasteurized cheese. Cottage cheese. Sour cream. Beverages Water. Juices that contain 100% fruit juice or vegetable juice. Caffeine-free teas and decaffeinated coffee. Drinks that contain caffeine are okay to drink, but it is better to avoid caffeine. Keep your total caffeine intake to less than 200 mg each day (12 oz of coffee, tea, or soda) or as directed by your health care provider. Condiments Any pasteurized condiments. Sweets and Desserts Any sweets and desserts. Fats and Oils Any fats and oils. The items listed above may not be a complete list of recommended foods or beverages. Contact your dietitian for more options. What foods are not recommended? Vegetables Unpasteurized (raw) vegetable juices. Fruits Unpasteurized (raw) fruit juices. Meats and Other Protein Sources Cured meats that have nitrates, such as bacon, salami, and hotdogs. Luncheon meats, bologna, or other deli meats (unless they are reheated until they are steaming hot). Refrigerated pate, meat spreads from a meat counter, smoked seafood that is found in the refrigerated section of a store. Raw fish, such as sushi or sashimi. High mercury content fish, such as tilefish, shark, swordfish, and king mackerel. Raw meats, such as tuna or beef tartare. Undercooked  meats and poultry. Make sure that all meats are cooked to food-safe temperatures. Dairy Unpasteurized (raw) milk and any foods that have raw milk in them. Soft cheeses, such as feta, queso blanco, queso fresco, Brie, Camembert cheeses, blue-veined cheeses, and Panela cheese (unless it is made with pasteurized milk, which must be stated on the label). Beverages Alcohol. Sugar-sweetened beverages, such as sodas, teas, or energy drinks. Condiments Homemade fermented foods and drinks, such as pickles, sauerkraut, or kombucha drinks. (Store-bought pasteurized versions of these are okay.) Other Salads that are made in the store, such as ham salad, chicken salad, egg salad, tuna salad, and seafood salad. The items listed above may not be a complete list of foods and beverages to avoid. Contact your dietitian for more information. This information is not intended to replace advice given to you by your health care provider. Make sure you discuss any questions you have with your health care provider. Document Released: 10/19/2013 Document Revised: 06/12/2015 Document Reviewed: 06/19/2013 Elsevier Interactive Patient Education  2018 [place]. WHAT OB PATIENTS CAN EXPECT   Confirmation of pregnancy and ultrasound ordered if medically indicated-[redacted] weeks gestation  New OB (NOB) intake with nurse and New OB (NOB) labs- [redacted] weeks gestation  New OB (NOB) physical examination with provider- 11/[redacted] weeks gestation  Flu vaccine-[redacted] weeks gestation  Anatomy scan-[redacted] weeks gestation  Glucose tolerance test, blood work to test for anemia, T-dap vaccine-[redacted] weeks gestation  Vaginal swabs/cultures-STD/Group B strep-[redacted] weeks gestation  Appointments every 4 weeks until 28 weeks  Every 2 weeks from 28 weeks until 36 weeks  Weekly visits from 36 weeks until delivery

## 2017-10-24 NOTE — Progress Notes (Signed)
NEW OB HISTORY AND PHYSICAL  SUBJECTIVE:       Jamie Ortega is a 32 y.o. [id number] female, Patient's last menstrual period was 07/26/2017 (exact date)., Estimated Date of Delivery: 05/04/18, [redacted]w[redacted]d, presents today for establishment of Prenatal Care.  Notices vaginal spotting once a week with wiping. Endorses nausea and vomiting related to acid reflux. Reports relief of symptoms with omeprazole.    Denies difficulty breathing or respiratory distress, chest pain, abdominal pain, excessive vaginal bleeding, dysuria, and leg pain or swelling.    Gynecologic History  Patient's last menstrual period was 07/26/2017 (exact date).   Contraception: none  Last Pap: 08/2016. Results were: normal  Obstetric History  OB History  Gravida Para Term Preterm AB Living  6 2 2  0 3 2  SAB TAB Ectopic Multiple Live Births  0 0 0 0 2    # Outcome Date GA Lbr Len/2nd Weight Sex Delivery Anes PTL Lv  6 Current           5 Term      Vag-Spont   LIV  4 Term      Vag-Spont   LIV  3 AB           2 AB           1 AB             Past Medical History:  Diagnosis Date  . Genital herpes     Past Surgical History:  Procedure Laterality Date  . WISDOM TOOTH EXTRACTION      Current Outpatient Medications on File Prior to Visit  Medication Sig Dispense Refill  . metoCLOPramide (REGLAN) 10 MG tablet Take 1 tablet (10 mg total) by mouth every 6 (six) hours as needed. 30 tablet 0  . Doxylamine-Pyridoxine (DICLEGIS) 10-10 MG TBEC Take 2 tablets by mouth at bedtime. If symptoms persist, add one tablet in the morning and one in the afternoon (Patient not taking: Reported on 09/26/2017) 100 tablet 5  . Prenatal Vit-Fe Fumarate-FA (PRENATAL MULTIVITAMIN) TABS tablet Take 1 tablet by mouth daily at 12 noon.    . promethazine (PHENERGAN) 25 MG tablet Take 1 tablet (25 mg total) by mouth every 6 (six) hours as needed for nausea or vomiting. (Patient not taking: Reported on 10/24/2017) 30 tablet 2  . ranitidine  (ZANTAC) 150 MG tablet Take 1 tablet (150 mg total) by mouth 2 (two) times daily. (Patient not taking: Reported on 10/24/2017) 60 tablet 1  . valACYclovir (VALTREX) 1000 MG tablet Take 1 tablet (1,000 mg total) by mouth 2 (two) times daily. (Patient not taking: Reported on 05/20/2017) 14 tablet 6   No current facility-administered medications on file prior to visit.     No Known Allergies  Social History   Socioeconomic History  . Marital status: Married    Spouse name: Not on file  . Number of children: 2  . Years of education: Not on file  . Highest education level: Not on file  Occupational History  . Not on file  Social Needs  . Financial resource strain: Not on file  . Food insecurity:    Worry: Not on file    Inability: Not on file  . Transportation needs:    Medical: Not on file    Non-medical: Not on file  Tobacco Use  . Smoking status: Never Smoker  . Smokeless tobacco: Never Used  Substance and Sexual Activity  . Alcohol use: Yes    Comment: Socially  . Drug  use: No  . Sexual activity: Yes    Partners: Male    Birth control/protection: None  Lifestyle  . Physical activity:    Days per week: Not on file    Minutes per session: Not on file  . Stress: Not on file  Relationships  . Social connections:    Talks on phone: Not on file    Gets together: Not on file    Attends religious service: Not on file    Active member of club or organization: Not on file    Attends meetings of clubs or organizations: Not on file    Relationship status: Not on file  . Intimate partner violence:    Fear of current or ex partner: Not on file    Emotionally abused: Not on file    Physically abused: Not on file    Forced sexual activity: Not on file  Other Topics Concern  . Not on file  Social History Narrative   Married.   2 children.    Works at [employer] [place].    Enjoys kayaking, spending time outdoors.     Family History  Problem Relation Age of Onset  . Cervical  cancer Mother   . Endometriosis Mother   . Arthritis Mother   . Neuropathy Mother   . Hypertension Mother   . Anxiety disorder Mother   . Post-traumatic stress disorder Mother   . Hyperlipidemia Father   . Rheum arthritis Maternal Grandmother   . COPD Maternal Grandmother   . Emphysema Maternal Grandmother   . Prostate cancer Maternal Grandfather   . Cancer Paternal Grandfather        Jaw    The following portions of the patient's history were reviewed and updated as appropriate: allergies, current medications, past OB history, past medical history, past surgical history, past family history, past social history, and problem list.    OBJECTIVE:   BP 103/74   Pulse 87   Wt 197 lb 6.4 oz (89.5 kg)   LMP 07/26/2017 (Exact Date)   BMI 36.10 kg/m   Initial Physical Exam (New OB)  GENERAL APPEARANCE: alert, well appearing, in no apparent distress  HEAD: normocephalic, atraumatic  MOUTH: mucous membranes moist, pharynx normal without lesions  THYROID: no thyromegaly or masses present  BREASTS: no masses noted, no significant tenderness, no palpable axillary nodes, no skin changes  LUNGS: clear to auscultation, no wheezes, rales or rhonchi, symmetric air entry  HEART: regular rate and rhythm, no murmurs  ABDOMEN: soft, nontender, nondistended, no abnormal masses, no epigastric pain, obese and FHT present  EXTREMITIES: no redness or tenderness in the calves or thighs, no edema  SKIN: normal coloration and turgor, no rashes  LYMPH NODES: no adenopathy palpable  NEUROLOGIC: alert, oriented, normal speech, no focal findings or movement disorder noted  PELVIC EXAM EXTERNAL GENITALIA: normal appearing vulva with no masses, tenderness or lesions VAGINA: no abnormal discharge or lesions CERVIX: no CMT, single red polyp noted UTERUS: gravid and consistent with 12 weeks ADNEXA: no masses palpable and nontender OB EXAM PELVIMETRY: appears adequate  ASSESSMENT: Normal  pregnancy Desires genetic screening Elevated TSH on New OB labs GERD in pregnancy Nausea/Vomiting in pregnancy Vaginal discharge in pregnancy Cervical polyp removal  PLAN: Panorama collected today Repeat Thyroid panel today NuSwab collected Cervical polyp grasped with ring forcep and gently removed, hemostatic Prenatal care New OB counseling: The patient has been given an overview regarding routine prenatal care. Recommendations regarding diet, weight gain, and exercise in  pregnancy were given. Prenatal testing, optional genetic testing, and ultrasound use in pregnancy were reviewed.  Benefits of Breast Feeding were discussed. The patient is encouraged to consider nursing her baby post partum. See orders   Diona Fanti, CNM [facility], [facility]

## 2017-10-24 NOTE — Progress Notes (Signed)
NOB physical, pap UTD, TSH panel ordered.  Pt still c/o N&V, "stomach is burning".  Panorama ordered today, per patient request.

## 2017-10-25 ENCOUNTER — Other Ambulatory Visit: Payer: Self-pay | Admitting: Certified Nurse Midwife

## 2017-10-25 ENCOUNTER — Encounter: Payer: Self-pay | Admitting: Certified Nurse Midwife

## 2017-10-25 DIAGNOSIS — Z3480 Encounter for supervision of other normal pregnancy, unspecified trimester: Secondary | ICD-10-CM

## 2017-10-25 DIAGNOSIS — R7989 Other specified abnormal findings of blood chemistry: Secondary | ICD-10-CM

## 2017-10-25 LAB — THYROID PANEL WITH TSH
FREE THYROXINE INDEX: 2.7 (ref 1.2–4.9)
T3 UPTAKE RATIO: 16 % — AB (ref 24–39)
T4 TOTAL: 16.9 ug/dL — AB (ref 4.5–12.0)
TSH: 0.048 u[IU]/mL — ABNORMAL LOW (ref 0.450–4.500)

## 2017-10-25 LAB — CBC WITH DIFFERENTIAL/PLATELET
BASOS ABS: 0 10*3/uL (ref 0.0–0.2)
Basos: 0 %
EOS (ABSOLUTE): 0 10*3/uL (ref 0.0–0.4)
Eos: 0 %
Hematocrit: 39.7 % (ref 34.0–46.6)
Hemoglobin: 13.5 g/dL (ref 11.1–15.9)
IMMATURE GRANULOCYTES: 0 %
Immature Grans (Abs): 0 10*3/uL (ref 0.0–0.1)
Lymphocytes Absolute: 1.7 10*3/uL (ref 0.7–3.1)
Lymphs: 17 %
MCH: 30.9 pg (ref 26.6–33.0)
MCHC: 34 g/dL (ref 31.5–35.7)
MCV: 91 fL (ref 79–97)
MONOS ABS: 0.5 10*3/uL (ref 0.1–0.9)
Monocytes: 5 %
NEUTROS PCT: 78 %
Neutrophils Absolute: 7.7 10*3/uL — ABNORMAL HIGH (ref 1.4–7.0)
PLATELETS: 339 10*3/uL (ref 150–450)
RBC: 4.37 x10E6/uL (ref 3.77–5.28)
RDW: 13.3 % (ref 12.3–15.4)
WBC: 9.9 10*3/uL (ref 3.4–10.8)

## 2017-10-25 LAB — CERVICOVAGINAL ANCILLARY ONLY
BACTERIAL VAGINITIS: NEGATIVE
CHLAMYDIA, DNA PROBE: NEGATIVE
Candida vaginitis: NEGATIVE
Neisseria Gonorrhea: NEGATIVE
Trichomonas: NEGATIVE

## 2017-10-25 NOTE — Telephone Encounter (Signed)
Fine with me. Thanks, JML

## 2017-10-30 ENCOUNTER — Other Ambulatory Visit: Payer: Self-pay | Admitting: Certified Nurse Midwife

## 2017-11-03 ENCOUNTER — Telehealth: Payer: Self-pay | Admitting: Certified Nurse Midwife

## 2017-11-03 ENCOUNTER — Telehealth: Payer: Self-pay

## 2017-11-03 NOTE — Telephone Encounter (Signed)
Patient given panorama results.

## 2017-11-03 NOTE — Telephone Encounter (Signed)
The patient called and stated that she is the patients sister and would like to have the gender results of the patient disclosed to her, today if possible. Please advise.

## 2017-11-03 NOTE — Telephone Encounter (Signed)
Spoke with Almyra Deforest, patient Sister and revealed the gender.

## 2017-11-16 ENCOUNTER — Encounter: Payer: Self-pay | Admitting: Endocrinology

## 2017-11-28 ENCOUNTER — Encounter: Payer: Self-pay | Admitting: Endocrinology

## 2017-11-28 ENCOUNTER — Ambulatory Visit (INDEPENDENT_AMBULATORY_CARE_PROVIDER_SITE_OTHER): Payer: BLUE CROSS/BLUE SHIELD | Admitting: Certified Nurse Midwife

## 2017-11-28 ENCOUNTER — Other Ambulatory Visit: Payer: BLUE CROSS/BLUE SHIELD

## 2017-11-28 ENCOUNTER — Ambulatory Visit: Payer: BLUE CROSS/BLUE SHIELD | Admitting: Endocrinology

## 2017-11-28 VITALS — BP 90/53 | HR 85 | Wt 206.2 lb

## 2017-11-28 VITALS — BP 118/72 | HR 95 | Ht 62.0 in | Wt 208.6 lb

## 2017-11-28 DIAGNOSIS — R7989 Other specified abnormal findings of blood chemistry: Secondary | ICD-10-CM | POA: Diagnosis not present

## 2017-11-28 DIAGNOSIS — Z3490 Encounter for supervision of normal pregnancy, unspecified, unspecified trimester: Secondary | ICD-10-CM

## 2017-11-28 DIAGNOSIS — Z3480 Encounter for supervision of other normal pregnancy, unspecified trimester: Secondary | ICD-10-CM

## 2017-11-28 DIAGNOSIS — Z131 Encounter for screening for diabetes mellitus: Secondary | ICD-10-CM | POA: Diagnosis not present

## 2017-11-28 MED ORDER — METHIMAZOLE 10 MG PO TABS: 10.0000 mg | ORAL_TABLET | Freq: Three times a day (TID) | ORAL | 2 refills | Status: DC

## 2017-11-28 NOTE — Progress Notes (Signed)
ROB doing well. Early glucose screen today. Discussed round ligament pain and back pain. Recommend use of belly band , tylenol, heat and ice. Offered referral for chiropractor. Pt declines at this time Will follow up with result from 1 hr. ROB 3 wks for anatomy scan with Melody.   Philip Aspen, CNM

## 2017-11-28 NOTE — Patient Instructions (Addendum)
I have sent a prescription to your pharmacy, to slow the thyroid.  If ever you have fever while taking methimazole, stop it and call us, even if the reason is obvious, because of the risk of a rare side-effect.  Please come back for a follow-up appointment in 1 month.  Out goal is to slightly undertreat the overactive thyroid.      Hyperthyroidism Hyperthyroidism is when the thyroid is too active (overactive). Your thyroid is a large gland that is located in your neck. The thyroid helps to control how your body uses food (metabolism). When your thyroid is overactive, it produces too much of a hormone called thyroxine. What are the causes? Causes of hyperthyroidism may include:  Graves disease. This is when your immune system attacks the thyroid gland. This is the most common cause.  Inflammation of the thyroid gland.  Tumor in the thyroid gland or somewhere else.  Excessive use of thyroid medicines, including: ? Prescription thyroid supplement. ? Herbal supplements that mimic thyroid hormones.  Solid or fluid-filled lumps within your thyroid gland (thyroid nodules).  Excessive ingestion of iodine.  What increases the risk?  Being female.  Having a family history of thyroid conditions. What are the signs or symptoms? Signs and symptoms of hyperthyroidism may include:  Nervousness.  Inability to tolerate heat.  Unexplained weight loss.  Diarrhea.  Change in the texture of hair or skin.  Heart skipping beats or making extra beats.  Rapid heart rate.  Loss of menstruation.  Shaky hands.  Fatigue.  Restlessness.  Increased appetite.  Sleep problems.  Enlarged thyroid gland or nodules.  How is this diagnosed? Diagnosis of hyperthyroidism may include:  Medical history and physical exam.  Blood tests.  Ultrasound tests.  How is this treated? Treatment may include:  Medicines to control your thyroid.  Surgery to remove your thyroid.  Radiation  therapy.  Follow these instructions at home:  Take medicines only as directed by your health care provider.  Do not use any tobacco products, including cigarettes, chewing tobacco, or electronic cigarettes. If you need help quitting, ask your health care provider.  Do not exercise or do physical activity until your health care provider approves.  Keep all follow-up appointments as directed by your health care provider. This is important. Contact a health care provider if:  Your symptoms do not get better with treatment.  You have fever.  You are taking thyroid replacement medicine and you: ? Have depression. ? Feel mentally and physically slow. ? Have weight gain. Get help right away if:  You have decreased alertness or a change in your awareness.  You have abdominal pain.  You feel dizzy.  You have a rapid heartbeat.  You have an irregular heartbeat. This information is not intended to replace advice given to you by your health care provider. Make sure you discuss any questions you have with your health care provider. Document Released: 01/04/2005 Document Revised: 06/05/2015 Document Reviewed: 05/22/2013 Elsevier Interactive Patient Education  2018 [employer].

## 2017-11-28 NOTE — Progress Notes (Signed)
Subjective:    Patient ID: Jamie Ortega, female    DOB: 04/11/1985, 32 y.o.   MRN: [id number]  HPI Pt is referred by Dani Gobble, CMN, for hyperthyroidism.  Pt reports he was dx'ed with hyperthyroidism in 9/19.  she has never been on therapy for this.  she has never had XRT to the anterior neck, or thyroid surgery.  she has never had thyroid imaging.  she does not consume kelp or any other prescribed or non-prescribed thyroid medication.  she has never been on amiodarone.   She is [redacted] weeks pregnant.  She has slight tremor of the hands, and assoc dry skin.  She has gained no weight with the pregnancy so far. Past Medical History:  Diagnosis Date  . Genital herpes     Past Surgical History:  Procedure Laterality Date  . WISDOM TOOTH EXTRACTION      Social History   Socioeconomic History  . Marital status: Married    Spouse name: Not on file  . Number of children: 2  . Years of education: Not on file  . Highest education level: Not on file  Occupational History  . Not on file  Social Needs  . Financial resource strain: Not on file  . Food insecurity:    Worry: Not on file    Inability: Not on file  . Transportation needs:    Medical: Not on file    Non-medical: Not on file  Tobacco Use  . Smoking status: Never Smoker  . Smokeless tobacco: Never Used  Substance and Sexual Activity  . Alcohol use: Yes    Comment: Socially  . Drug use: No  . Sexual activity: Yes    Partners: Male    Birth control/protection: None  Lifestyle  . Physical activity:    Days per week: Not on file    Minutes per session: Not on file  . Stress: Not on file  Relationships  . Social connections:    Talks on phone: Not on file    Gets together: Not on file    Attends religious service: Not on file    Active member of club or organization: Not on file    Attends meetings of clubs or organizations: Not on file    Relationship status: Not on file  . Intimate partner violence:   Fear of current or ex partner: Not on file    Emotionally abused: Not on file    Physically abused: Not on file    Forced sexual activity: Not on file  Other Topics Concern  . Not on file  Social History Narrative   Married.   2 children.    Works at [employer] [place].    Enjoys kayaking, spending time outdoors.     Current Outpatient Medications on File Prior to Visit  Medication Sig Dispense Refill  . Prenatal Vit-Fe Fumarate-FA (PRENATAL MULTIVITAMIN) TABS tablet Take 1 tablet by mouth daily at 12 noon.     No current facility-administered medications on file prior to visit.     No Known Allergies  Family History  Problem Relation Age of Onset  . Cervical cancer Mother   . Endometriosis Mother   . Arthritis Mother   . Neuropathy Mother   . Hypertension Mother   . Anxiety disorder Mother   . Post-traumatic stress disorder Mother   . Hyperthyroidism Mother   . Hyperlipidemia Father   . Rheum arthritis Maternal Grandmother   . COPD Maternal Grandmother   .  Emphysema Maternal Grandmother   . Prostate cancer Maternal Grandfather   . Cancer Paternal Grandfather        Jaw    BP 118/72 (BP Location: Right Arm, Patient Position: Sitting, Cuff Size: Normal)   Pulse 95   Ht 5\' 2"  (1.575 m)   Wt 208 lb 9.6 oz (94.6 kg)   LMP 07/26/2017 (Exact Date)   SpO2 97%   BMI 38.15 kg/m     Review of Systems denies headache, fever, hoarseness, diplopia, palpitations, sob, diarrhea, polyuria, muscle weakness, edema, excessive diaphoresis, tremor, anxiety, easy bruising, and rhinorrhea.  She has fatigue, heat intolerance, and intermitt headache.      Objective:   Physical Exam VS: see vs page GEN: no distress HEAD: head: no deformity eyes: no periorbital swelling, no proptosis external nose and ears are normal mouth: no lesion seen NECK: supple, thyroid is not enlarged CHEST WALL: no deformity LUNGS: clear to auscultation CV: reg rate and rhythm, no murmur ABD: abdomen is  soft, nontender.  no hepatosplenomegaly.  not distended.  no hernia MUSCULOSKELETAL: muscle bulk and strength are grossly normal.  no obvious joint swelling.  gait is normal and steady EXTEMITIES: no deformity.  no edema PULSES: no carotid bruit NEURO:  cn 2-12 grossly intact.   readily moves all 4's.  sensation is intact to touch on all 4's.  No tremor SKIN:  Normal texture and temperature.  No rash or suspicious lesion is visible.  Not diaphoretic NODES:  None palpable at the neck PSYCH: alert, well-oriented.  Does not appear anxious nor depressed.   Lab Results  Component Value Date   TSH 0.048 (L) 10/24/2017   T4TOTAL 16.9 (H) 10/24/2017   I have reviewed outside records, and summarized: Pt was noted to have abnormal TFT, and referred here.  She was seen for initial OB visit, and was doing well.       Assessment & Plan:  Hyperthyroidism, new to me, uncertain etiology Pregnancy, 2nd trimester.  Tapazole is preferred.   Patient Instructions  I have sent a prescription to your pharmacy, to slow the thyroid.  If ever you have fever while taking methimazole, stop it and call us, even if the reason is obvious, because of the risk of a rare side-effect.  Please come back for a follow-up appointment in 1 month.  Out goal is to slightly undertreat the overactive thyroid.      Hyperthyroidism Hyperthyroidism is when the thyroid is too active (overactive). Your thyroid is a large gland that is located in your neck. The thyroid helps to control how your body uses food (metabolism). When your thyroid is overactive, it produces too much of a hormone called thyroxine. What are the causes? Causes of hyperthyroidism may include:  Graves disease. This is when your immune system attacks the thyroid gland. This is the most common cause.  Inflammation of the thyroid gland.  Tumor in the thyroid gland or somewhere else.  Excessive use of thyroid medicines, including: ? Prescription  thyroid supplement. ? Herbal supplements that mimic thyroid hormones.  Solid or fluid-filled lumps within your thyroid gland (thyroid nodules).  Excessive ingestion of iodine.  What increases the risk?  Being female.  Having a family history of thyroid conditions. What are the signs or symptoms? Signs and symptoms of hyperthyroidism may include:  Nervousness.  Inability to tolerate heat.  Unexplained weight loss.  Diarrhea.  Change in the texture of hair or skin.  Heart skipping beats or making extra beats.  Rapid heart rate.  Loss of menstruation.  Shaky hands.  Fatigue.  Restlessness.  Increased appetite.  Sleep problems.  Enlarged thyroid gland or nodules.  How is this diagnosed? Diagnosis of hyperthyroidism may include:  Medical history and physical exam.  Blood tests.  Ultrasound tests.  How is this treated? Treatment may include:  Medicines to control your thyroid.  Surgery to remove your thyroid.  Radiation therapy.  Follow these instructions at home:  Take medicines only as directed by your health care provider.  Do not use any tobacco products, including cigarettes, chewing tobacco, or electronic cigarettes. If you need help quitting, ask your health care provider.  Do not exercise or do physical activity until your health care provider approves.  Keep all follow-up appointments as directed by your health care provider. This is important. Contact a health care provider if:  Your symptoms do not get better with treatment.  You have fever.  You are taking thyroid replacement medicine and you: ? Have depression. ? Feel mentally and physically slow. ? Have weight gain. Get help right away if:  You have decreased alertness or a change in your awareness.  You have abdominal pain.  You feel dizzy.  You have a rapid heartbeat.  You have an irregular heartbeat. This information is not intended to replace advice given to you by  your health care provider. Make sure you discuss any questions you have with your health care provider. Document Released: 01/04/2005 Document Revised: 06/05/2015 Document Reviewed: 05/22/2013 Elsevier Interactive Patient Education  2018 [employer].

## 2017-11-28 NOTE — Patient Instructions (Signed)

## 2017-11-29 LAB — GLUCOSE TOLERANCE, 1 HOUR: GLUCOSE, 1HR PP: 104 mg/dL (ref 65–199)

## 2017-12-02 ENCOUNTER — Other Ambulatory Visit: Payer: BLUE CROSS/BLUE SHIELD

## 2017-12-02 ENCOUNTER — Encounter: Payer: BLUE CROSS/BLUE SHIELD | Admitting: Certified Nurse Midwife

## 2017-12-12 ENCOUNTER — Encounter: Payer: Self-pay | Admitting: Certified Nurse Midwife

## 2017-12-21 ENCOUNTER — Telehealth: Payer: Self-pay | Admitting: Obstetrics and Gynecology

## 2017-12-21 NOTE — Telephone Encounter (Signed)
The patient called to check on the status of her FMLA paperwork. I spoke w/ Amy, and was informed to let the patient know that her paperwork will be completed by Friday. Thank you.

## 2017-12-22 ENCOUNTER — Ambulatory Visit (INDEPENDENT_AMBULATORY_CARE_PROVIDER_SITE_OTHER): Payer: BLUE CROSS/BLUE SHIELD

## 2017-12-22 ENCOUNTER — Ambulatory Visit: Payer: BLUE CROSS/BLUE SHIELD | Admitting: Endocrinology

## 2017-12-22 ENCOUNTER — Ambulatory Visit (INDEPENDENT_AMBULATORY_CARE_PROVIDER_SITE_OTHER): Payer: BLUE CROSS/BLUE SHIELD | Admitting: Certified Nurse Midwife

## 2017-12-22 VITALS — BP 98/67 | HR 79 | Wt 211.2 lb

## 2017-12-22 DIAGNOSIS — Z3689 Encounter for other specified antenatal screening: Secondary | ICD-10-CM

## 2017-12-22 DIAGNOSIS — Z363 Encounter for antenatal screening for malformations: Secondary | ICD-10-CM | POA: Diagnosis not present

## 2017-12-22 DIAGNOSIS — Z3480 Encounter for supervision of other normal pregnancy, unspecified trimester: Secondary | ICD-10-CM

## 2017-12-22 DIAGNOSIS — Z3492 Encounter for supervision of normal pregnancy, unspecified, second trimester: Secondary | ICD-10-CM | POA: Diagnosis not present

## 2017-12-22 DIAGNOSIS — Z3490 Encounter for supervision of normal pregnancy, unspecified, unspecified trimester: Principal | ICD-10-CM

## 2017-12-22 LAB — POCT URINALYSIS DIPSTICK OB
Bilirubin, UA: NEGATIVE
GLUCOSE, UA: NEGATIVE
LEUKOCYTES UA: NEGATIVE
Nitrite, UA: NEGATIVE
SPEC GRAV UA: 1.02 (ref 1.010–1.025)
Urobilinogen, UA: 0.2 E.U./dL
pH, UA: 7 (ref 5.0–8.0)

## 2017-12-22 MED ORDER — CYCLOBENZAPRINE HCL 5 MG PO TABS: 5.0000 mg | ORAL_TABLET | Freq: Three times a day (TID) | ORAL | 0 refills | Status: DC | PRN

## 2017-12-22 NOTE — Progress Notes (Signed)
ROB, c/o intermittent mid back pain that started a month ago.  Patient taking Tylenol with some relief.

## 2017-12-22 NOTE — Patient Instructions (Addendum)
Back Pain in Pregnancy Back pain during pregnancy is common. Back pain may be caused by several factors that are related to changes during your pregnancy. Follow these instructions at home: Managing pain, stiffness, and swelling  If directed, apply ice for sudden (acute) back pain. ? Put ice in a plastic bag. ? Place a towel between your skin and the bag. ? Leave the ice on for 20 minutes, 2-3 times per day.  If directed, apply heat to the affected area before you exercise: ? Place a towel between your skin and the heat pack or heating pad. ? Leave the heat on for 20-30 minutes. ? Remove the heat if your skin turns bright red. This is especially important if you are unable to feel pain, heat, or cold. You may have a greater risk of getting burned. Activity  Exercise as told by your health care provider. Exercising is the best way to prevent or manage back pain.  Listen to your body when lifting. If lifting hurts, ask for help or bend your knees. This uses your leg muscles instead of your back muscles.  Squat down when picking up something from the floor. Do not bend over.  Only use bed rest as told by your health care provider. Bed rest should only be used for the most severe episodes of back pain. Standing, Sitting, and Lying Down  Do not stand in one place for long periods of time.  Use good posture when sitting. Make sure your head rests over your shoulders and is not hanging forward. Use a pillow on your lower back if necessary.  Try sleeping on your side, preferably the left side, with a pillow or two between your legs. If you are sore after a night's rest, your bed may be too soft. A firm mattress may provide more support for your back during pregnancy. General instructions  Do not wear high heels.  Eat a healthy diet. Try to gain weight within your health care provider's recommendations.  Use a maternity girdle, elastic sling, or back brace as told by your health  care provider.  Take over-the-counter and prescription medicines only as told by your health care provider.  Keep all follow-up visits as told by your health care provider. This is important. This includes any visits with any specialists, such as a physical therapist. Contact a health care provider if:  Your back pain interferes with your daily activities.  You have increasing pain in other parts of your body. Get help right away if:  You develop numbness, tingling, weakness, or problems with the use of your arms or legs.  You develop severe back pain that is not controlled with medicine.  You have a sudden change in bowel or bladder control.  You develop shortness of breath, dizziness, or you faint.  You develop nausea, vomiting, or sweating.  You have back pain that is a rhythmic, cramping pain similar to labor pains. Labor pain is usually 1-2 minutes apart, lasts for about 1 minute, and involves a bearing down feeling or pressure in your pelvis.  You have back pain and your water breaks or you have vaginal bleeding.  You have back pain or numbness that travels down your leg.  Your back pain developed after you fell.  You develop pain on one side of your back.  You see blood in your urine.  You develop skin blisters in the area of your back pain. This information is not intended to replace advice  given to you by your health care provider. Make sure you discuss any questions you have with your health care provider. Document Released: 04/14/2005 Document Revised: 06/12/2015 Document Reviewed: 09/18/2014 Elsevier Interactive Patient Education  2018 [employer]. Cyclobenzaprine tablets What is this medicine? CYCLOBENZAPRINE (sye kloe BEN za preen) is a muscle relaxer. It is used to treat muscle pain, spasms, and stiffness. This medicine may be used for other purposes; ask your health care provider or pharmacist if you have questions. COMMON BRAND NAME(S): Fexmid,  Flexeril What should I tell my health care provider before I take this medicine? They need to know if you have any of these conditions: -heart disease, irregular heartbeat, or previous heart attack -liver disease -thyroid problem -an unusual or allergic reaction to cyclobenzaprine, tricyclic antidepressants, lactose, other medicines, foods, dyes, or preservatives -pregnant or trying to get pregnant -breast-feeding How should I use this medicine? Take this medicine by mouth with a glass of water. Follow the directions on the prescription label. If this medicine upsets your stomach, take it with food or milk. Take your medicine at regular intervals. Do not take it more often than directed. Talk to your pediatrician regarding the use of this medicine in children. Special care may be needed. Overdosage: If you think you have taken too much of this medicine contact a poison control center or emergency room at once. NOTE: This medicine is only for you. Do not share this medicine with others. What if I miss a dose? If you miss a dose, take it as soon as you can. If it is almost time for your next dose, take only that dose. Do not take double or extra doses. What may interact with this medicine? Do not take this medicine with any of the following medications: -certain medicines for fungal infections like fluconazole, itraconazole, ketoconazole, posaconazole, voriconazole -cisapride -dofetilide -dronedarone -halofantrine -levomethadyl -MAOIs like Carbex, Eldepryl, Marplan, Nardil, and Parnate -narcotic medicines for cough -pimozide -thioridazine -ziprasidone This medicine may also interact with the following medications: -alcohol -antihistamines for allergy, cough and cold -certain medicines for anxiety or sleep -certain medicines for cancer -certain medicines for depression like amitriptyline, fluoxetine, sertraline -certain medicines for infection like alfuzosin, chloroquine,  clarithromycin, levofloxacin, mefloquine, pentamidine, troleandomycin -certain medicines for irregular heart beat -certain medicines for seizures like phenobarbital, primidone -contrast dyes -general anesthetics like halothane, isoflurane, methoxyflurane, propofol -local anesthetics like lidocaine, pramoxine, tetracaine -medicines that relax muscles for surgery -narcotic medicines for pain -other medicines that prolong the QT interval (cause an abnormal heart rhythm) -phenothiazines like chlorpromazine, mesoridazine, prochlorperazine This list may not describe all possible interactions. Give your health care provider a list of all the medicines, herbs, non-prescription drugs, or dietary supplements you use. Also tell them if you smoke, drink alcohol, or use illegal drugs. Some items may interact with your medicine. What should I watch for while using this medicine? Tell your doctor or health care professional if your symptoms do not start to get better or if they get worse. You may get drowsy or dizzy. Do not drive, use machinery, or do anything that needs mental alertness until you know how this medicine affects you. Do not stand or sit up quickly, especially if you are an older patient. This reduces the risk of dizzy or fainting spells. Alcohol may interfere with the effect of this medicine. Avoid alcoholic drinks. If you are taking another medicine that also causes drowsiness, you may have more side effects. Give your health care provider a list of all  medicines you use. Your doctor will tell you how much medicine to take. Do not take more medicine than directed. Call emergency for help if you have problems breathing or unusual sleepiness. Your mouth may get dry. Chewing sugarless gum or sucking hard candy, and drinking plenty of water may help. Contact your doctor if the problem does not go away or is severe. What side effects may I notice from receiving this medicine? Side effects that you  should report to your doctor or health care professional as soon as possible: -allergic reactions like skin rash, itching or hives, swelling of the face, lips, or tongue -breathing problems -chest pain -fast, irregular heartbeat -hallucinations -seizures -unusually weak or tired Side effects that usually do not require medical attention (report to your doctor or health care professional if they continue or are bothersome): -headache -nausea, vomiting This list may not describe all possible side effects. Call your doctor for medical advice about side effects. You may report side effects to FDA at 1-800-FDA-1088. Where should I keep my medicine? Keep out of the reach of children. Store at room temperature between 15 and 30 degrees C (59 and 86 degrees F). Keep container tightly closed. Throw away any unused medicine after the expiration date. NOTE: This sheet is a summary. It may not cover all possible information. If you have questions about this medicine, talk to your doctor, pharmacist, or health care provider.  2018 Elsevier/Gold Standard (2014-10-15 12:05:46)

## 2017-12-22 NOTE — Progress Notes (Signed)
ROB-Reports intermittent back pain x 1 month, notes mild relief with Tylenol and abdominal support. Discussed other home treatment measures. Rx: Flexeril, see orders. Anatomy scan today incomplete, but normal. Naming son, Jamie Ortega. Anticipatory guidance regarding course of prenatal care. Sample birth plan and [facility] Doula Pamphlet given. Reviewed red flag symptoms and when to call. RTC x 4 weeks for follow up ultrasound and ROB or sooner if needed.   ULTRASOUND REPORT  Location: [facility] Date of Service: 12/22/2017   Indications:Anatomy Ultrasound Findings:  Singleton intrauterine pregnancy is visualized with FHR at 144 BPM. Biometrics give an (U/S) Gestational age of [redacted]w[redacted]d and an (U/S) EDD of 04/29/18; this correlates with the clinically established Estimated Date of Delivery: 05/04/18  Fetal presentation is Cephalic.  EFW: wnl. Placenta: posterior. Grade: 0 AFI: subjectively normal.  Anatomic survey is incomplete for 4CH,Nose/lips and face d/t fetal position and normal; Gender - female.    Right Ovary is normal in appearance. Left Ovary is normal appearance. Survey of the adnexa demonstrates no adnexal masses. There is no free peritoneal fluid in the cul de sac.  Impression: 1. [redacted]w[redacted]d Viable Singleton Intrauterine pregnancy by U/S. 2. (U/S) EDD is consistent with Clinically established Estimated Date of Delivery: 05/04/18 . 3. Normal Anatomy Scan, incomplete for 4CH,Nose/lips and face d/t fetal position  Recommendations: 1.Clinical correlation with the patient's History and Physical Exam.

## 2018-01-06 ENCOUNTER — Telehealth: Payer: Self-pay | Admitting: Certified Nurse Midwife

## 2018-01-06 NOTE — Telephone Encounter (Signed)
The patient would like to speak with her nurse in regards to her paperwork. Patient was advised to check her MyChart messages and that she will receive a call back. Please advise.

## 2018-01-06 NOTE — Telephone Encounter (Signed)
The patient called and stated that she would like to speak with a nurse in regards to her fmla paperwork not being correct. The patient is aware that she will receive a call back Monday Morning. Please advise.

## 2018-01-09 ENCOUNTER — Telehealth: Payer: Self-pay

## 2018-01-09 NOTE — Telephone Encounter (Signed)
A call was placed to [place] at [facility]- [phone] ext [phone]. Explained to her patient is requesting 4 hours per day 5 days per week leave. Explained to Jamie Ortega this is patients request. Patient at present is not experiencing any difficulties with pregnancy that has been brought to our attention by the patient- she has had normal routine OB visits. Patient was given 2 hours once per month to attend OB office visit. This includes travel time to and from. Explained to Jamie Ortega this will change as the trimesters change. She will be authorized one  2 hour allowance for visit and travel time for every 2 weeks until she is 36 weeks in gestation. It will change once again to one 2 hour visit/travel time per week until delivery. Per Jamie Ortega- an amendment may be made on original disability paper or new papers can be requested for each change.

## 2018-01-13 ENCOUNTER — Telehealth: Payer: Self-pay | Admitting: Certified Nurse Midwife

## 2018-01-13 NOTE — Telephone Encounter (Signed)
The patient called and stated that she would like to speak with Sharyn Lull ASAP in regards to her back issues and FMLA paperwork. The patient requested to send this message high priority. Please advise.

## 2018-01-13 NOTE — Telephone Encounter (Signed)
Called pt we discussed her back pain, paperwork sent in

## 2018-01-13 NOTE — Telephone Encounter (Signed)
Please contact patient. Thanks, JML.

## 2018-01-18 NOTE — L&D Delivery Note (Signed)
Delivery Note   Jamie Ortega is a 33 y.o. [id number] at [redacted]w[redacted]d Estimated Date of Delivery: 05/04/18  PRE-OPERATIVE DIAGNOSIS:  1) [redacted]w[redacted]d pregnancy.   POST-OPERATIVE DIAGNOSIS:  1) [redacted]w[redacted]d pregnancy s/p Vaginal, Spontaneous   Delivery Type: Vaginal, Spontaneous    Delivery Anesthesia: Epidural   Labor Complications:   none    ESTIMATED BLOOD LOSS: 300 ml    FINDINGS:   1) female infant, Apgar scores of 5    at 1 minute and 9    at 5 minutes and a birthweight of    ounces.    2) Nuchal cord: Yes x 1 , easily reduced   SPECIMENS:   PLACENTA:   Appearance: Intact , 3 vessel cord   Removal: Spontaneous      Disposition:   Held per protocol then discarded   DISPOSITION:  Infant to left in stable condition in the delivery room, with L&D personnel and mother,  NARRATIVE SUMMARY: Labor course:  Ms. Jamie Ortega is a [id number] at [redacted]w[redacted]d who presented for labor management.  She progressed well in labor with pitocin.  She received the appropriate anesthesia and proceeded to complete dilation. She evidenced good maternal expulsive effort during the second stage. She went on to deliver a viable infant female infant. The placenta delivered without problems and was noted to be complete. A perineal and vaginal examination was performed. Lacerations: 1st degree  .  Lacerations was repaired with 3-0 Vicryl Rapide suture. The patient tolerated this well.  Philip Aspen, CNM  05/02/2018 1:20 PM

## 2018-01-19 ENCOUNTER — Ambulatory Visit (INDEPENDENT_AMBULATORY_CARE_PROVIDER_SITE_OTHER): Payer: BLUE CROSS/BLUE SHIELD | Admitting: Obstetrics and Gynecology

## 2018-01-19 ENCOUNTER — Ambulatory Visit (INDEPENDENT_AMBULATORY_CARE_PROVIDER_SITE_OTHER): Payer: BLUE CROSS/BLUE SHIELD

## 2018-01-19 VITALS — BP 103/70 | HR 71 | Wt 216.2 lb

## 2018-01-19 DIAGNOSIS — M5431 Sciatica, right side: Secondary | ICD-10-CM

## 2018-01-19 DIAGNOSIS — Z3689 Encounter for other specified antenatal screening: Secondary | ICD-10-CM

## 2018-01-19 DIAGNOSIS — Z362 Encounter for other antenatal screening follow-up: Secondary | ICD-10-CM

## 2018-01-19 DIAGNOSIS — Z3492 Encounter for supervision of normal pregnancy, unspecified, second trimester: Secondary | ICD-10-CM

## 2018-01-19 HISTORY — DX: Sciatica, right side: M54.31

## 2018-01-19 LAB — POCT URINALYSIS DIPSTICK OB
BILIRUBIN UA: NEGATIVE
Blood, UA: NEGATIVE
Glucose, UA: NEGATIVE
Ketones, UA: NEGATIVE
Leukocytes, UA: NEGATIVE
Nitrite, UA: NEGATIVE
PH UA: 6 (ref 5.0–8.0)
POC,PROTEIN,UA: NEGATIVE
Spec Grav, UA: 1.02 (ref 1.010–1.025)
UROBILINOGEN UA: 0.2 U/dL

## 2018-01-19 NOTE — Progress Notes (Signed)
ROB- pt is still having back pain

## 2018-01-19 NOTE — Patient Instructions (Signed)
Sciatica    Sciatica is pain, numbness, weakness, or tingling along the path of the sciatic nerve. The sciatic nerve starts in the lower back and runs down the back of each leg. The nerve controls the muscles in the lower leg and in the back of the knee. It also provides feeling (sensation) to the back of the thigh, the lower leg, and the sole of the foot. Sciatica is a symptom of another medical condition that pinches or puts pressure on the sciatic nerve.  Generally, sciatica only affects one side of the body. Sciatica usually goes away on its own or with treatment. In some cases, sciatica may keep coming back (recur).  What are the causes?  This condition is caused by pressure on the sciatic nerve, or pinching of the sciatic nerve. This may be the result of:  · A disk in between the bones of the spine (vertebrae) bulging out too far (herniated disk).  · Age-related changes in the spinal disks (degenerative disk disease).  · A pain disorder that affects a muscle in the buttock (piriformis syndrome).  · Extra bone growth (bone spur) near the sciatic nerve.  · An injury or break (fracture) of the pelvis.  · Pregnancy.  · Tumor (rare).  What increases the risk?  The following factors may make you more likely to develop this condition:  · Playing sports that place pressure or stress on the spine, such as football or weight lifting.  · Having poor strength and flexibility.  · A history of back injury.  · A history of back surgery.  · Sitting for long periods of time.  · Doing activities that involve repetitive bending or lifting.  · Obesity.  What are the signs or symptoms?  Symptoms can vary from mild to very severe, and they may include:  · Any of these problems in the lower back, leg, hip, or buttock:  ? Mild tingling or dull aches.  ? Burning sensations.  ? Sharp pains.  · Numbness in the back of the calf or the sole of the foot.  · Leg weakness.  · Severe back pain that makes movement difficult.  These symptoms  may get worse when you cough, sneeze, or laugh, or when you sit or stand for long periods of time. Being overweight may also make symptoms worse. In some cases, symptoms may recur over time.  How is this diagnosed?  This condition may be diagnosed based on:  · Your symptoms.  · A physical exam. Your health care provider may ask you to do certain movements to check whether those movements trigger your symptoms.  · You may have tests, including:  ? Blood tests.  ? X-rays.  ? MRI.  ? CT scan.  How is this treated?  In many cases, this condition improves on its own, without any treatment. However, treatment may include:  · Reducing or modifying physical activity during periods of pain.  · Exercising and stretching to strengthen your abdomen and improve the flexibility of your spine.  · Icing and applying heat to the affected area.  · Medicines that help:  ? To relieve pain and swelling.  ? To relax your muscles.  · Injections of medicines that help to relieve pain, irritation, and inflammation around the sciatic nerve (steroids).  · Surgery.  Follow these instructions at home:  Medicines  · Take over-the-counter and prescription medicines only as told by your health care provider.  · Do not drive or operate heavy   machinery while taking prescription pain medicine.  Managing pain  · If directed, apply ice to the affected area.  ? Put ice in a plastic bag.  ? Place a towel between your skin and the bag.  ? Leave the ice on for 20 minutes, 2-3 times a day.  · After icing, apply heat to the affected area before you exercise or as often as told by your health care provider. Use the heat source that your health care provider recommends, such as a moist heat pack or a heating pad.  ? Place a towel between your skin and the heat source.  ? Leave the heat on for 20-30 minutes.  ? Remove the heat if your skin turns bright red. This is especially important if you are unable to feel pain, heat, or cold. You may have a greater risk  of getting burned.  Activity  · Return to your normal activities as told by your health care provider. Ask your health care provider what activities are safe for you.  ? Avoid activities that make your symptoms worse.  · Take brief periods of rest throughout the day. Resting in a lying or standing position is usually better than sitting to rest.  ? When you rest for longer periods, mix in some mild activity or stretching between periods of rest. This will help to prevent stiffness and pain.  ? Avoid sitting for long periods of time without moving. Get up and move around at least one time each hour.  · Exercise and stretch regularly, as told by your health care provider.  · Do not lift anything that is heavier than 10 lb (4.5 kg) while you have symptoms of sciatica. When you do not have symptoms, you should still avoid heavy lifting, especially repetitive heavy lifting.  · When you lift objects, always use proper lifting technique, which includes:  ? Bending your knees.  ? Keeping the load close to your body.  ? Avoiding twisting.  General instructions  · Use good posture.  ? Avoid leaning forward while sitting.  ? Avoid hunching over while standing.  · Maintain a healthy weight. Excess weight puts extra stress on your back and makes it difficult to maintain good posture.  · Wear supportive, comfortable shoes. Avoid wearing high heels.  · Avoid sleeping on a mattress that is too soft or too hard. A mattress that is firm enough to support your back when you sleep may help to reduce your pain.  · Keep all follow-up visits as told by your health care provider. This is important.  Contact a health care provider if:  · You have pain that wakes you up when you are sleeping.  · You have pain that gets worse when you lie down.  · Your pain is worse than you have experienced in the past.  · Your pain lasts longer than 4 weeks.  · You experience unexplained weight loss.  Get help right away if:  · You lose control of your  bowel or bladder (incontinence).  · You have:  ? Weakness in your lower back, pelvis, buttocks, or legs that gets worse.  ? Redness or swelling of your back.  ? A burning sensation when you urinate.  This information is not intended to replace advice given to you by your health care provider. Make sure you discuss any questions you have with your health care provider.  Document Released: 12/29/2000 Document Revised: 06/10/2015 Document Reviewed: 09/13/2014  Elsevier Interactive Patient Education ©   2019 [employer]

## 2018-01-19 NOTE — Progress Notes (Signed)
ROB and completing of anatomy scan: Ok to see Dr Emmit Pomfret- card given.    Reviewed scan below: Indications: Anatomy follow up ultrasound Findings:  Singleton intrauterine pregnancy is visualized with FHR at 155 BPM.  Fetal presentation is Breech.  Placenta: posterior. Grade: 1 AFI: subjectively normal.  Anatomic survey is complete.   There is no free peritoneal fluid in the cul de sac.  Impression: 1. [redacted]w[redacted]d Viable Singleton Intrauterine pregnancy previously established criteria. 2. Normal Anatomy Scan is now complete for 4CH, face nose /Lips

## 2018-02-06 ENCOUNTER — Other Ambulatory Visit: Payer: Self-pay | Admitting: Certified Nurse Midwife

## 2018-02-06 ENCOUNTER — Ambulatory Visit (INDEPENDENT_AMBULATORY_CARE_PROVIDER_SITE_OTHER): Payer: BLUE CROSS/BLUE SHIELD | Admitting: Certified Nurse Midwife

## 2018-02-06 ENCOUNTER — Encounter: Payer: Self-pay | Admitting: Certified Nurse Midwife

## 2018-02-06 ENCOUNTER — Other Ambulatory Visit: Payer: BLUE CROSS/BLUE SHIELD

## 2018-02-06 VITALS — BP 98/60 | HR 77 | Wt 219.2 lb

## 2018-02-06 DIAGNOSIS — Z3492 Encounter for supervision of normal pregnancy, unspecified, second trimester: Secondary | ICD-10-CM

## 2018-02-06 LAB — POCT URINALYSIS DIPSTICK OB
Bilirubin, UA: NEGATIVE
Blood, UA: NEGATIVE
GLUCOSE, UA: NEGATIVE
Ketones, UA: NEGATIVE
Leukocytes, UA: NEGATIVE
Nitrite, UA: NEGATIVE
POC,PROTEIN,UA: NEGATIVE
Spec Grav, UA: 1.015 (ref 1.010–1.025)
Urobilinogen, UA: 0.2 E.U./dL
pH, UA: 5 (ref 5.0–8.0)

## 2018-02-06 MED ORDER — TETANUS-DIPHTH-ACELL PERTUSSIS 5-2.5-18.5 LF-MCG/0.5 IM SUSP
0.5000 mL | Freq: Once | INTRAMUSCULAR | Status: AC
  Administered 2018-02-06: 0.5 mL via INTRAMUSCULAR

## 2018-02-06 NOTE — Patient Instructions (Signed)
Glucose Tolerance Test During Pregnancy Why am I having this test? The glucose tolerance test (GTT) is done to check how your body processes sugar (glucose). This is one of several tests used to diagnose diabetes that develops during pregnancy (gestational diabetes mellitus). Gestational diabetes is a temporary form of diabetes that some women develop during pregnancy. It usually occurs during the second trimester of pregnancy and goes away after delivery. Testing (screening) for gestational diabetes usually occurs between 24 and 28 weeks of pregnancy. You may have the GTT test after having a 1-hour glucose screening test if the results from that test indicate that you may have gestational diabetes. You may also have this test if:  You have a history of gestational diabetes.  You have a history of giving birth to very large babies or have experienced repeated fetal loss (stillbirth).  You have signs and symptoms of diabetes, such as: ? Changes in your vision. ? Tingling or numbness in your hands or feet. ? Changes in hunger, thirst, and urination that are not otherwise explained by your pregnancy. What is being tested? This test measures the amount of glucose in your blood at different times during a period of 3 hours. This indicates how well your body is able to process glucose. What kind of sample is taken?  Blood samples are required for this test. They are usually collected by inserting a needle into a blood vessel. How do I prepare for this test?  For 3 days before your test, eat normally. Have plenty of carbohydrate-rich foods.  Follow instructions from your health care provider about: ? Eating or drinking restrictions on the day of the test. You may be asked to not eat or drink anything other than water (fast) starting 8-10 hours before the test. ? Changing or stopping your regular medicines. Some medicines may interfere with this test. Tell a health care provider about:  All  medicines you are taking, including vitamins, herbs, eye drops, creams, and over-the-counter medicines.  Any blood disorders you have.  Any surgeries you have had.  Any medical conditions you have. What happens during the test? First, your blood glucose will be measured. This is referred to as your fasting blood glucose, since you fasted before the test. Then, you will drink a glucose solution that contains a certain amount of glucose. Your blood glucose will be measured again 1, 2, and 3 hours after drinking the solution. This test takes about 3 hours to complete. You will need to stay at the testing location during this time. During the testing period:  Do not eat or drink anything other than the glucose solution.  Do not exercise.  Do not use any products that contain nicotine or tobacco, such as cigarettes and e-cigarettes. If you need help stopping, ask your health care provider. The testing procedure may vary among health care providers and hospitals. How are the results reported? Your results will be reported as milligrams of glucose per deciliter of blood (mg/dL) or millimoles per liter (mmol/L). Your health care provider will compare your results to normal ranges that were established after testing a large group of people (reference ranges). Reference ranges may vary among labs and hospitals. For this test, common reference ranges are:  Fasting: less than 95-105 mg/dL (5.3-5.8 mmol/L).  1 hour after drinking glucose: less than 180-190 mg/dL (10.0-10.5 mmol/L).  2 hours after drinking glucose: less than 155-165 mg/dL (8.6-9.2 mmol/L).  3 hours after drinking glucose: 140-145 mg/dL (7.8-8.1 mmol/L). What do the   results mean? Results within reference ranges are considered normal, meaning that your glucose levels are well-controlled. If two or more of your blood glucose levels are high, you may be diagnosed with gestational diabetes. If only one level is high, your health care  provider may suggest repeat testing or other tests to confirm a diagnosis. Talk with your health care provider about what your results mean. Questions to ask your health care provider Ask your health care provider, or the department that is doing the test:  When will my results be ready?  How will I get my results?  What are my treatment options?  What other tests do I need?  What are my next steps? Summary  The glucose tolerance test (GTT) is one of several tests used to diagnose diabetes that develops during pregnancy (gestational diabetes mellitus). Gestational diabetes is a temporary form of diabetes that some women develop during pregnancy.  You may have the GTT test after having a 1-hour glucose screening test if the results from that test indicate that you may have gestational diabetes. You may also have this test if you have any symptoms or risk factors for gestational diabetes.  Talk with your health care provider about what your results mean. This information is not intended to replace advice given to you by your health care provider. Make sure you discuss any questions you have with your health care provider. Document Released: 07/06/2011 Document Revised: 08/16/2016 Document Reviewed: 08/16/2016 Elsevier Interactive Patient Education  2019 Elsevier Inc.  

## 2018-02-06 NOTE — Progress Notes (Signed)
ROB doing well. Feels good movement. 28 wk labs today. BTC/TDap today. Reviewed birth control after baby ,  [place] doula program, and birth plan. Pt states she will probably use condoms. She is seeing a chiropractor for back pain which she says does not really help much. Will follow up with lab results. Return in 2 wks.   Philip Aspen, CNM

## 2018-02-07 LAB — CBC
Hematocrit: 33.5 % — ABNORMAL LOW (ref 34.0–46.6)
Hemoglobin: 10.9 g/dL — ABNORMAL LOW (ref 11.1–15.9)
MCH: 29.6 pg (ref 26.6–33.0)
MCHC: 32.5 g/dL (ref 31.5–35.7)
MCV: 91 fL (ref 79–97)
Platelets: 314 10*3/uL (ref 150–450)
RBC: 3.68 x10E6/uL — AB (ref 3.77–5.28)
RDW: 12.8 % (ref 11.7–15.4)
WBC: 11 10*3/uL — ABNORMAL HIGH (ref 3.4–10.8)

## 2018-02-07 LAB — RPR: RPR Ser Ql: NONREACTIVE

## 2018-02-07 LAB — GLUCOSE, 1 HOUR GESTATIONAL: Gestational Diabetes Screen: 123 mg/dL (ref 65–139)

## 2018-02-09 ENCOUNTER — Encounter: Payer: BLUE CROSS/BLUE SHIELD | Admitting: Certified Nurse Midwife

## 2018-02-09 ENCOUNTER — Other Ambulatory Visit: Payer: BLUE CROSS/BLUE SHIELD

## 2018-02-10 ENCOUNTER — Telehealth: Payer: Self-pay | Admitting: Certified Nurse Midwife

## 2018-02-10 ENCOUNTER — Other Ambulatory Visit: Payer: Self-pay | Admitting: Certified Nurse Midwife

## 2018-02-10 DIAGNOSIS — O26893 Other specified pregnancy related conditions, third trimester: Secondary | ICD-10-CM

## 2018-02-10 DIAGNOSIS — R102 Pelvic and perineal pain: Secondary | ICD-10-CM

## 2018-02-10 DIAGNOSIS — O99891 Other specified diseases and conditions complicating pregnancy: Secondary | ICD-10-CM

## 2018-02-10 DIAGNOSIS — O9989 Other specified diseases and conditions complicating pregnancy, childbirth and the puerperium: Principal | ICD-10-CM

## 2018-02-10 DIAGNOSIS — M549 Dorsalgia, unspecified: Secondary | ICD-10-CM

## 2018-02-10 HISTORY — DX: Dorsalgia, unspecified: M54.9

## 2018-02-10 HISTORY — DX: Other specified diseases and conditions complicating pregnancy: O99.891

## 2018-02-10 NOTE — Telephone Encounter (Signed)
The patient called and stated that she would like to speak with Jamie Ortega in regards to her taking her out of work and needing approval/note for her employer. The patient is requesting a call back, no other inform ation was disclosed. Please advise.

## 2018-02-10 NOTE — Telephone Encounter (Signed)
Spoke with patient, advised that Midwife Team wants her to go to PT.  Patient states she is already seeing a chiropractor but agrees to go to PT.  Patient will call [facility] in [place] to set up PT appointment and will call back if we need to send in referral.

## 2018-02-13 ENCOUNTER — Encounter: Payer: Self-pay | Admitting: Certified Nurse Midwife

## 2018-02-13 ENCOUNTER — Telehealth: Payer: Self-pay

## 2018-02-13 NOTE — Telephone Encounter (Signed)
Patient called requesting referral be sent to [facility] in [place].  Per Martin Majestic C. referral will be faxed.

## 2018-02-20 ENCOUNTER — Ambulatory Visit (INDEPENDENT_AMBULATORY_CARE_PROVIDER_SITE_OTHER): Payer: BLUE CROSS/BLUE SHIELD | Admitting: Certified Nurse Midwife

## 2018-02-20 VITALS — BP 112/73 | HR 91 | Wt 221.7 lb

## 2018-02-20 DIAGNOSIS — O99891 Other specified diseases and conditions complicating pregnancy: Secondary | ICD-10-CM

## 2018-02-20 DIAGNOSIS — N3001 Acute cystitis with hematuria: Secondary | ICD-10-CM | POA: Diagnosis not present

## 2018-02-20 DIAGNOSIS — M5431 Sciatica, right side: Secondary | ICD-10-CM

## 2018-02-20 DIAGNOSIS — O9989 Other specified diseases and conditions complicating pregnancy, childbirth and the puerperium: Secondary | ICD-10-CM

## 2018-02-20 DIAGNOSIS — N3081 Other cystitis with hematuria: Secondary | ICD-10-CM

## 2018-02-20 DIAGNOSIS — M549 Dorsalgia, unspecified: Secondary | ICD-10-CM

## 2018-02-20 DIAGNOSIS — O1203 Gestational edema, third trimester: Secondary | ICD-10-CM

## 2018-02-20 DIAGNOSIS — Z3A29 29 weeks gestation of pregnancy: Secondary | ICD-10-CM

## 2018-02-20 DIAGNOSIS — N020 Recurrent and persistent hematuria with minor glomerular abnormality: Secondary | ICD-10-CM

## 2018-02-20 DIAGNOSIS — Z3483 Encounter for supervision of other normal pregnancy, third trimester: Secondary | ICD-10-CM

## 2018-02-20 LAB — POCT URINALYSIS DIPSTICK OB
BILIRUBIN UA: NEGATIVE
Glucose, UA: NEGATIVE
Ketones, UA: NEGATIVE
Leukocytes, UA: NEGATIVE
Nitrite, UA: NEGATIVE
Spec Grav, UA: 1.02 (ref 1.010–1.025)
Urobilinogen, UA: 0.2 E.U./dL
pH, UA: 6 (ref 5.0–8.0)

## 2018-02-20 NOTE — Progress Notes (Signed)
Subjective:   Jamie Ortega is a 33 y.o. [id number] [redacted]w[redacted]d being seen today for her obstetrical visit.    Reports constant inner thigh, vaginal and upper leg soreness as well as intermittent lower back pain.   Taking Flexeril, seeing chiropractor,  and wearing abdominal support with minimal to no relief.     Unable to see Physical Therapist due to insurance deductible. Requests maternity leave at this time.   Denies contractions, vaginal bleeding or leaking of fluid.  Reports good fetal movement.  The following portions of the patient's history were reviewed and updated as appropriate: allergies, current medications, past family history, past medical history, past social history, past surgical history and problem list.   Review of Systems:  ROS negative except as noted above. Information obtained from patient.   Objective:   BP 112/73   Pulse 91   Wt 221 lb 11.2 oz (100.6 kg)   LMP 07/26/2017 (Exact Date)   BMI 40.55 kg/m   FHT: Fetal Heart Rate (bpm): 144  Uterine Size: Fundal Height: 30 cm  Fetal Movement: Movement: Present    Abdomen:  soft, gravid, appropriate for gestational age,non-tender  Vulvar:  no varicosities present   Results for orders placed or performed in visit on 02/20/18 (from the past 24 hour(s))  POC Urinalysis Dipstick OB     Status: None   Collection Time: 02/20/18  4:14 PM  Result Value Ref Range   Color, UA Yellow    Clarity, UA Clear    Glucose, UA Negative Negative   Bilirubin, UA Negative    Ketones, UA Negative    Spec Grav, UA 1.020 1.010 - 1.025   Blood, UA Moderate    pH, UA 6.0 5.0 - 8.0   POC,PROTEIN,UA Trace Negative, Trace, Small (1+), Moderate (2+), Large (3+), 4+   Urobilinogen, UA 0.2 0.2 or 1.0 E.U./dL   Nitrite, UA Negative    Leukocytes, UA Negative Negative   Appearance     Odor      Assessment:   Pregnancy:  [id number] at [redacted]w[redacted]d  1. Encounter for supervision of other normal pregnancy in third trimester  - POC  Urinalysis Dipstick OB  2. Back pain affecting pregnancy in third trimester   3. Right sciatic nerve pain   4. Swelling of lower extremity during pregnancy in third trimester   Plan:   Urine culture, see orders.   Discussed home treatment measures, pt verbalizes understanding.   Desires maternity leave at the time. Advised pt that once she is writtent out of work that she will not be provided with a return to work note until after her baby is born and she verbalized understanding.   Work note given; Education officer, [employer]. Encouraged to bring FMLA forms to office as soon as possible.   Preterm labor symptoms: vaginal bleeding, contractions and leaking of fluid reviewed in detail.  Fetal movement precautions reviewed.  Follow up in 2 weeks for ROB or sooner if needed.   Diona Fanti, CNM [facility], [facility] 02/20/18 4:41 PM

## 2018-02-20 NOTE — Patient Instructions (Signed)
WHAT OB PATIENTS CAN EXPECT   Confirmation of pregnancy and ultrasound ordered if medically indicated-[redacted] weeks gestation  New OB (NOB) intake with nurse and New OB (NOB) labs- [redacted] weeks gestation  New OB (NOB) physical examination with provider- 11/[redacted] weeks gestation  Flu vaccine-[redacted] weeks gestation  Anatomy scan-[redacted] weeks gestation  Glucose tolerance test, blood work to test for anemia, T-dap vaccine-[redacted] weeks gestation  Vaginal swabs/cultures-STD/Group B strep-[redacted] weeks gestation  Appointments every 4 weeks until 28 weeks  Every 2 weeks from 28 weeks until 36 weeks  Weekly visits from 36 weeks until delivery  Third Trimester of Pregnancy  The third trimester is from week 28 through week 40 (months 7 through 9). This trimester is when your unborn baby (fetus) is growing very fast. At the end of the ninth month, the unborn baby is about 20 inches in length. It weighs about 6-10 pounds. Follow these instructions at home: Medicines  Take over-the-counter and prescription medicines only as told by your doctor. Some medicines are safe and some medicines are not safe during pregnancy.  Take a prenatal vitamin that contains at least 600 micrograms (mcg) of folic acid.  If you have trouble pooping (constipation), take medicine that will make your stool soft (stool softener) if your doctor approves. Eating and drinking   Eat regular, healthy meals.  Avoid raw meat and uncooked cheese.  If you get low calcium from the food you eat, talk to your doctor about taking a daily calcium supplement.  Eat four or five small meals rather than three large meals a day.  Avoid foods that are high in fat and sugars, such as fried and sweet foods.  To prevent constipation: ? Eat foods that are high in fiber, like fresh fruits and vegetables, whole grains, and beans. ? Drink enough fluids to keep your pee (urine) clear or pale yellow. Activity  Exercise only as told by your doctor. Stop  exercising if you start to have cramps.  Avoid heavy lifting, wear low heels, and sit up straight.  Do not exercise if it is too hot, too humid, or if you are in a place of great height (high altitude).  You may continue to have sex unless your doctor tells you not to. Relieving pain and discomfort  Wear a good support bra if your breasts are tender.  Take frequent breaks and rest with your legs raised if you have leg cramps or low back pain.  Take warm water baths (sitz baths) to soothe pain or discomfort caused by hemorrhoids. Use hemorrhoid cream if your doctor approves.  If you develop puffy, bulging veins (varicose veins) in your legs: ? Wear support hose or compression stockings as told by your doctor. ? Raise (elevate) your feet for 15 minutes, 3-4 times a day. ? Limit salt in your food. Safety  Wear your seat belt when driving.  Make a list of emergency phone numbers, including numbers for family, friends, the hospital, and police and fire departments. Preparing for your baby's arrival To prepare for the arrival of your baby:  Take prenatal classes.  Practice driving to the hospital.  Visit the hospital and tour the maternity area.  Talk to your work about taking leave once the baby comes.  Pack your hospital bag.  Prepare the baby's room.  Go to your doctor visits.  Buy a rear-facing car seat. Learn how to install it in your car. General instructions  Do not use hot tubs, steam rooms, or saunas.  Do not use any products that contain nicotine or tobacco, such as cigarettes and e-cigarettes. If you need help quitting, ask your doctor.  Do not drink alcohol.  Do not douche or use tampons or scented sanitary pads.  Do not cross your legs for long periods of time.  Do not travel for long distances unless you must. Only do so if your doctor says it is okay.  Visit your dentist if you have not gone during your pregnancy. Use a soft toothbrush to brush your  teeth. Be gentle when you floss.  Avoid cat litter boxes and soil used by cats. These carry germs that can cause birth defects in the baby and can cause a loss of your baby (miscarriage) or stillbirth.  Keep all your prenatal visits as told by your doctor. This is important. Contact a doctor if:  You are not sure if you are in labor or if your water has broken.  You are dizzy.  You have mild cramps or pressure in your lower belly.  You have a nagging pain in your belly area.  You continue to feel sick to your stomach, you throw up, or you have watery poop.  You have bad smelling fluid coming from your vagina.  You have pain when you pee. Get help right away if:  You have a fever.  You are leaking fluid from your vagina.  You are spotting or bleeding from your vagina.  You have severe belly cramps or pain.  You lose or gain weight quickly.  You have trouble catching your breath and have chest pain.  You notice sudden or extreme puffiness (swelling) of your face, hands, ankles, feet, or legs.  You have not felt the baby move in over an hour.  You have severe headaches that do not go away with medicine.  You have trouble seeing.  You are leaking, or you are having a gush of fluid, from your vagina before you are 37 weeks.  You have regular belly spasms (contractions) before you are 37 weeks. Summary  The third trimester is from week 28 through week 40 (months 7 through 9). This time is when your unborn baby is growing very fast.  Follow your doctor's advice about medicine, food, and activity.  Get ready for the arrival of your baby by taking prenatal classes, getting all the baby items ready, preparing the baby's room, and visiting your doctor to be checked.  Get help right away if you are bleeding from your vagina, or you have chest pain and trouble catching your breath, or if you have not felt your baby move in over an hour. This information is not intended to  replace advice given to you by your health care provider. Make sure you discuss any questions you have with your health care provider. Document Released: 03/31/2009 Document Revised: 02/10/2016 Document Reviewed: 02/10/2016 Elsevier Interactive Patient Education  2019 [employer].

## 2018-02-20 NOTE — Progress Notes (Signed)
ROB-Patient c/o constant inner thigh, vaginal and upper leg "soreness" x1 month.  Patient c/o intermittent lower back pain, taking flexeril and wearing belly support belt with relief.  Patient also seeing Chiropractor with no relief.

## 2018-02-22 LAB — URINE CULTURE

## 2018-02-23 ENCOUNTER — Encounter: Payer: Self-pay | Admitting: Certified Nurse Midwife

## 2018-02-27 ENCOUNTER — Telehealth: Payer: Self-pay

## 2018-02-27 NOTE — Telephone Encounter (Signed)
Returned a call to [place] at [employer], no answer.  [facility] if she needed further assistance with Mrs.Brantleys short term disability forms.

## 2018-03-06 ENCOUNTER — Ambulatory Visit (INDEPENDENT_AMBULATORY_CARE_PROVIDER_SITE_OTHER): Payer: BLUE CROSS/BLUE SHIELD | Admitting: Certified Nurse Midwife

## 2018-03-06 ENCOUNTER — Encounter: Payer: Self-pay | Admitting: Certified Nurse Midwife

## 2018-03-06 VITALS — BP 117/57 | HR 92 | Wt 221.6 lb

## 2018-03-06 DIAGNOSIS — Z3483 Encounter for supervision of other normal pregnancy, third trimester: Secondary | ICD-10-CM

## 2018-03-06 LAB — POCT URINALYSIS DIPSTICK OB
Bilirubin, UA: NEGATIVE
Glucose, UA: NEGATIVE
Ketones, UA: NEGATIVE
Leukocytes, UA: NEGATIVE
Nitrite, UA: NEGATIVE
POC,PROTEIN,UA: NEGATIVE
SPEC GRAV UA: 1.015 (ref 1.010–1.025)
Urobilinogen, UA: 0.2 E.U./dL
pH, UA: 6.5 (ref 5.0–8.0)

## 2018-03-06 NOTE — Patient Instructions (Signed)
How a Baby Grows During Pregnancy  Pregnancy begins when a female's sperm enters a female's egg (fertilization). Fertilization usually happens in one of the tubes (fallopian tubes) that connect the ovaries to the womb (uterus). The fertilized egg moves down the fallopian tube to the uterus. Once it reaches the uterus, it implants into the lining of the uterus and begins to grow. For the first 10 weeks, the fertilized egg is called an embryo. After 10 weeks, it is called a fetus. As the fetus continues to grow, it receives oxygen and nutrients through tissue (placenta) that grows to support the developing baby. The placenta is the life support system for the baby. It provides oxygen and nutrition and removes waste. Learning as much as you can about your pregnancy and how your baby is developing can help you enjoy the experience. It can also make you aware of when there might be a problem and when to ask questions. How long does a typical pregnancy last? A pregnancy usually lasts 280 days, or about 40 weeks. Pregnancy is divided into three periods of growth, also called trimesters:  First trimester: 0-12 weeks.  Second trimester: 13-27 weeks.  Third trimester: 28-40 weeks. The day when your baby is ready to be born (full term) is your estimated date of delivery. How does my baby develop month by month? First month  The fertilized egg attaches to the inside of the uterus.  Some cells will form the placenta. Others will form the fetus.  The arms, legs, brain, spinal cord, lungs, and heart begin to develop.  At the end of the first month, the heart begins to beat. Second month  The bones, inner ear, eyelids, hands, and feet form.  The genitals develop.  By the end of 8 weeks, all major organs are developing. Third month  All of the internal organs are forming.  Teeth develop below the gums.  Bones and muscles begin to grow. The spine can flex.  The skin is transparent.  Fingernails  and toenails begin to form.  Arms and legs continue to grow longer, and hands and feet develop.  The fetus is about 3 inches (7.6 cm) long. Fourth month  The placenta is completely formed.  The external sex organs, neck, outer ear, eyebrows, eyelids, and fingernails are formed.  The fetus can hear, swallow, and move its arms and legs.  The kidneys begin to produce urine.  The skin is covered with a white, waxy coating (vernix) and very fine hair (lanugo). Fifth month  The fetus moves around more and can be felt for the first time (quickening).  The fetus starts to sleep and wake up and may begin to suck its finger.  The nails grow to the end of the fingers.  The organ in the digestive system that makes bile (gallbladder) functions and helps to digest nutrients.  If your baby is a girl, eggs are present in her ovaries. If your baby is a boy, testicles start to move down into his scrotum. Sixth month  The lungs are formed.  The eyes open. The brain continues to develop.  Your baby has fingerprints and toe prints. Your baby's hair grows thicker.  At the end of the second trimester, the fetus is about 9 inches (22.9 cm) long. Seventh month  The fetus kicks and stretches.  The eyes are developed enough to sense changes in light.  The hands can make a grasping motion.  The fetus responds to sound. Eighth month  All  fetus is about 9 inches (22.9 cm) long.  Seventh month   The fetus kicks and stretches.   The eyes are developed enough to sense changes in light.   The hands can make a grasping motion.   The fetus responds to sound.  Eighth month   All organs and body systems are fully developed and functioning.   Bones harden, and taste buds develop. The fetus may hiccup.   Certain areas of the brain are still developing. The skull remains soft.  Ninth month   The fetus gains about  lb (0.23 kg) each week.   The lungs are fully developed.   Patterns of sleep develop.   The fetus's head typically moves into a head-down position (vertex) in the uterus to prepare for birth.   The fetus weighs 6-9 lb (2.72-4.08 kg) and is 19-20 inches (48.26-50.8 cm) long.  What can I do to have a healthy pregnancy and help  my baby develop?  General instructions   Take prenatal vitamins as directed by your health care provider. These include vitamins such as folic acid, iron, calcium, and vitamin D. They are important for healthy development.   Take medicines only as directed by your health care provider. Read labels and ask a pharmacist or your health care provider whether over-the-counter medicines, supplements, and prescription drugs are safe to take during pregnancy.   Keep all follow-up visits as directed by your health care provider. This is important. Follow-up visits include prenatal care and screening tests.  How do I know if my baby is developing well?  At each prenatal visit, your health care provider will do several different tests to check on your health and keep track of your baby's development. These include:   Fundal height and position.  ? Your health care provider will measure your growing belly from your pubic bone to the top of the uterus using a tape measure.  ? Your health care provider will also feel your belly to determine your baby's position.   Heartbeat.  ? An ultrasound in the first trimester can confirm pregnancy and show a heartbeat, depending on how far along you are.  ? Your health care provider will check your baby's heart rate at every prenatal visit.   Second trimester ultrasound.  ? This ultrasound checks your baby's development. It also may show your baby's gender.  What should I do if I have concerns about my baby's development?  Always talk with your health care provider about any concerns that you may have about your pregnancy and your baby.  Summary   A pregnancy usually lasts 280 days, or about 40 weeks. Pregnancy is divided into three periods of growth, also called trimesters.   Your health care provider will monitor your baby's growth and development throughout your pregnancy.   Follow your health care provider's recommendations about taking prenatal vitamins and medicines during  your pregnancy.   Talk with your health care provider if you have any concerns about your pregnancy or your developing baby.  This information is not intended to replace advice given to you by your health care provider. Make sure you discuss any questions you have with your health care provider.  Document Released: 06/23/2007 Document Revised: 11/17/2016 Document Reviewed: 11/17/2016  Elsevier Interactive Patient Education  2019 Elsevier Inc.

## 2018-03-06 NOTE — Progress Notes (Signed)
ROB doing well. Feels good movement. Complains of sore throat, medication sheet given, encouraged use of humidifier. She verbalizes and agrees. Follow up 2 wks.   Philip Aspen, CNM

## 2018-03-21 ENCOUNTER — Ambulatory Visit (INDEPENDENT_AMBULATORY_CARE_PROVIDER_SITE_OTHER): Payer: BLUE CROSS/BLUE SHIELD | Admitting: Obstetrics and Gynecology

## 2018-03-21 VITALS — BP 92/60 | HR 83 | Wt 222.2 lb

## 2018-03-21 DIAGNOSIS — Z3493 Encounter for supervision of normal pregnancy, unspecified, third trimester: Secondary | ICD-10-CM

## 2018-03-21 LAB — POCT URINALYSIS DIPSTICK OB
Bilirubin, UA: NEGATIVE
Glucose, UA: NEGATIVE
Ketones, UA: NEGATIVE
Leukocytes, UA: NEGATIVE
NITRITE UA: NEGATIVE
POC,PROTEIN,UA: NEGATIVE
Spec Grav, UA: 1.015 (ref 1.010–1.025)
Urobilinogen, UA: 0.2 E.U./dL
pH, UA: 6 (ref 5.0–8.0)

## 2018-03-21 NOTE — Progress Notes (Signed)
ROB-doing well, feels like baby is pushing on bladder. Has spouses FMLA papers today.

## 2018-03-21 NOTE — Progress Notes (Signed)
ROB- pt is having some insomnia

## 2018-03-29 ENCOUNTER — Telehealth: Payer: Self-pay | Admitting: Certified Nurse Midwife

## 2018-03-29 NOTE — Telephone Encounter (Signed)
The patient called and stated that she would like to know if her husbands FMLA paperwork has been sent. Please advise.

## 2018-04-04 ENCOUNTER — Ambulatory Visit (INDEPENDENT_AMBULATORY_CARE_PROVIDER_SITE_OTHER): Payer: BLUE CROSS/BLUE SHIELD | Admitting: Certified Nurse Midwife

## 2018-04-04 ENCOUNTER — Other Ambulatory Visit: Payer: Self-pay

## 2018-04-04 VITALS — BP 103/68 | HR 93 | Wt 226.0 lb

## 2018-04-04 DIAGNOSIS — Z3685 Encounter for antenatal screening for Streptococcus B: Secondary | ICD-10-CM

## 2018-04-04 DIAGNOSIS — Z3493 Encounter for supervision of normal pregnancy, unspecified, third trimester: Secondary | ICD-10-CM

## 2018-04-04 DIAGNOSIS — Z113 Encounter for screening for infections with a predominantly sexual mode of transmission: Secondary | ICD-10-CM | POA: Diagnosis not present

## 2018-04-04 MED ORDER — VALACYCLOVIR HCL 500 MG PO TABS: 500.0000 mg | ORAL_TABLET | Freq: Two times a day (BID) | ORAL | 6 refills | Status: DC

## 2018-04-04 NOTE — Progress Notes (Signed)
ROB- cultures obtained, pt is very irritable, having some pelvic pressure

## 2018-04-04 NOTE — Progress Notes (Addendum)
ROB-Feeling moody, reports intermittent vaginal leakage. Negative pooling on speculum exam, negative nitrazine, and negative fern. Likely urine or vaginal discharge. 36 week cultures collected, SVE unchanged since last visit. Rx: Valtrex, see orders. Discussed induction of labor in 39 week due to Body mass index is 41.34 kg/m. Anticipatory guidance regarding course of prenatal care. Reviewed red flag symptoms and when to call. RTC x 1 week for ROB or sooner if needed.

## 2018-04-04 NOTE — Patient Instructions (Signed)
WE WOULD LOVE TO HEAR FROM YOU!!!!   Thank you Fayrene Helper for visiting [facility].  Providing our patients with the best experience possible is really important to [place], and we hope that you felt that on your recent visit. The most valuable feedback we get comes from [place]!!    If you receive a survey please take a couple of minutes to let us know how we did.Thank you for continuing to trust [place] with your care.   [facility]   Labor Induction  Labor induction is when steps are taken to cause a pregnant woman to begin the labor process. Most women go into labor on their own between 37 weeks and 42 weeks of pregnancy. When this does not happen or when there is a medical need for labor to begin, steps may be taken to induce labor. Labor induction causes a pregnant woman's uterus to contract. It also causes the cervix to soften (ripen), open (dilate), and thin out (efface). Usually, labor is not induced before 39 weeks of pregnancy unless there is a medical reason to do so. Your health care provider will determine if labor induction is needed. Before inducing labor, your health care provider will consider a number of factors, including:  Your medical condition and your baby's.  How many weeks along you are in your pregnancy.  How mature your baby's lungs are.  The condition of your cervix.  The position of your baby.  The size of your birth canal. What are some reasons for labor induction? Labor may be induced if:  Your health or your baby's health is at risk.  Your pregnancy is overdue by 1 week or more.  Your water breaks but labor does not start on its own.  There is a low amount of amniotic fluid around your baby. You may also choose (elect) to have labor induced at a certain time. Generally, elective labor induction is done no earlier than 39 weeks of pregnancy. What methods are used for labor induction? Methods used for labor  induction include:  Prostaglandin medicine. This medicine starts contractions and causes the cervix to dilate and ripen. It can be taken by mouth (orally) or by being inserted into the vagina (suppository).  Inserting a small, thin tube (catheter) with a balloon into the vagina and then expanding the balloon with water to dilate the cervix.  Stripping the membranes. In this method, your health care provider gently separates amniotic sac tissue from the cervix. This causes the cervix to stretch, which in turn causes the release of a hormone called progesterone. The hormone causes the uterus to contract. This procedure is often done during an office visit, after which you will be sent home to wait for contractions to begin.  Breaking the water. In this method, your health care provider uses a small instrument to make a small hole in the amniotic sac. This eventually causes the amniotic sac to break. Contractions should begin after a few hours.  Medicine to trigger or strengthen contractions. This medicine is given through an IV that is inserted into a vein in your arm. Except for membrane stripping, which can be done in a clinic, labor induction is done in the hospital so that you and your baby can be carefully monitored. How long does it take for labor to be induced? The length of time it takes to induce labor depends on how ready your body is for labor. Some inductions can take up to 2-3 days,  while others may take less than a day. Induction may take longer if:  You are induced early in your pregnancy.  It is your first pregnancy.  Your cervix is not ready. What are some risks associated with labor induction? Some risks associated with labor induction include:  Changes in fetal heart rate, such as being too high, too low, or irregular (erratic).  Failed induction.  Infection in the mother or the baby.  Increased risk of having a cesarean delivery.  Fetal death.  Breaking off  (abruption) of the placenta from the uterus (rare).  Rupture of the uterus (very rare). When induction is needed for medical reasons, the benefits of induction generally outweigh the risks. What are some reasons for not inducing labor? Labor induction should not be done if:  Your baby does not tolerate contractions.  You have had previous surgeries on your uterus, such as a myomectomy, removal of fibroids, or a vertical scar from a previous cesarean delivery.  Your placenta lies very low in your uterus and blocks the opening of the cervix (placenta previa).  Your baby is not in a head-down position.  The umbilical cord drops down into the birth canal in front of the baby.  There are unusual circumstances, such as the baby being very early (premature).  You have had more than 2 previous cesarean deliveries. Summary  Labor induction is when steps are taken to cause a pregnant woman to begin the labor process.  Labor induction causes a pregnant woman's uterus to contract. It also causes the cervix to ripen, dilate, and efface.  Labor is not induced before 39 weeks of pregnancy unless there is a medical reason to do so.  When induction is needed for medical reasons, the benefits of induction generally outweigh the risks. This information is not intended to replace advice given to you by your health care provider. Make sure you discuss any questions you have with your health care provider. Document Released: 05/26/2006 Document Revised: 02/18/2016 Document Reviewed: 02/18/2016 Elsevier Interactive Patient Education  2019 Elsevier Inc[employer]   WHAT OB PATIENTS CAN EXPECT   Confirmation of pregnancy and ultrasound ordered if medically indicated-[redacted] weeks gestation  New OB (NOB) intake with nurse and New OB (NOB) labs- [redacted] weeks gestation  New OB (NOB) physical examination with provider- 11/[redacted] weeks gestation  Flu vaccine-[redacted] weeks gestation  Anatomy scan-[redacted] weeks gestation  Glucose  tolerance test, blood work to test for anemia, T-dap vaccine-[redacted] weeks gestation  Vaginal swabs/cultures-STD/Group B strep-[redacted] weeks gestation  Appointments every 4 weeks until 28 weeks  Every 2 weeks from 28 weeks until 36 weeks  Weekly visits from 36 weeks until delivery

## 2018-04-06 LAB — STREP GP B NAA: Strep Gp B NAA: NEGATIVE

## 2018-04-06 LAB — GC/CHLAMYDIA PROBE AMP
Chlamydia trachomatis, NAA: NEGATIVE
Neisseria gonorrhoeae by PCR: NEGATIVE

## 2018-04-10 NOTE — Progress Notes (Signed)
Coronavirus (COVID-19) Are you at risk?  Are you at risk for the Coronavirus (COVID-19)?  To be considered HIGH RISK for Coronavirus (COVID-19), you have to meet the following criteria:  . Traveled to [place], [place], [place], [place] or [place]; or in the [place] to [place], [place], [place], or [place]; and have fever, cough, and shortness of breath within the last 2 weeks of travel OR . Been in close contact with a person diagnosed with COVID-19 within the last 2 weeks and have fever, cough, and shortness of breath . IF YOU DO NOT MEET THESE CRITERIA, YOU ARE CONSIDERED LOW RISK FOR COVID-19.  What to do if you are HIGH RISK for COVID-19?  Marland Kitchen If you are having a medical emergency, call 911. . Seek medical care right away. Before you go to a doctor's office, urgent care or emergency department, call ahead and tell them about your recent travel, contact with someone diagnosed with COVID-19, and your symptoms. You should receive instructions from your physician's office regarding next steps of care.  . When you arrive at healthcare provider, tell the healthcare staff immediately you have returned from visiting [place], [place], [place], [place] or [place]; or traveled in the [place] to [place], [place], [place], or [place]; in the last two weeks or you have been in close contact with a person diagnosed with COVID-19 in the last 2 weeks.   . Tell the health care staff about your symptoms: fever, cough and shortness of breath. . After you have been seen by a medical provider, you will be either: o Tested for (COVID-19) and discharged home on quarantine except to seek medical care if symptoms worsen, and asked to  - Stay home and avoid contact with others until you get your results (4-5 days)  - Avoid travel on public transportation if possible (such as bus, train, or airplane) or o Sent to the Emergency Department by EMS for evaluation, COVID-19 testing, and possible  admission depending on your condition and test results.  What to do if you are LOW RISK for COVID-19?  Reduce your risk of any infection by using the same precautions used for avoiding the common cold or flu:  Marland Kitchen Wash your hands often with soap and warm water for at least 20 seconds.  If soap and water are not readily available, use an alcohol-based hand sanitizer with at least 60% alcohol.  . If coughing or sneezing, cover your mouth and nose by coughing or sneezing into the elbow areas of your shirt or coat, into a tissue or into your sleeve (not your hands). . Avoid shaking hands with others and consider head nods or verbal greetings only. . Avoid touching your eyes, nose, or mouth with unwashed hands.  . Avoid close contact with people who are Jamie Ortega. . Avoid places or events with large numbers of people in one location, like concerts or sporting events. . Carefully consider travel plans you have or are making. . If you are planning any travel outside or inside the [place], visit the CDC's Travelers' Health webpage for the latest health notices. . If you have some symptoms but not all symptoms, continue to monitor at home and seek medical attention if your symptoms worsen. . If you are having a medical emergency, call 911.  04/10/18 COVID 19 and flu screening done and is negative  [place] / e-Visit: [email or web address]  [facility]: [phone]  [facility]: [phone]                   [facility]: [phone]  

## 2018-04-11 ENCOUNTER — Other Ambulatory Visit: Payer: Self-pay

## 2018-04-11 ENCOUNTER — Ambulatory Visit (INDEPENDENT_AMBULATORY_CARE_PROVIDER_SITE_OTHER): Payer: BLUE CROSS/BLUE SHIELD | Admitting: Certified Nurse Midwife

## 2018-04-11 VITALS — BP 107/69 | HR 89 | Wt 226.7 lb

## 2018-04-11 DIAGNOSIS — Z3493 Encounter for supervision of normal pregnancy, unspecified, third trimester: Secondary | ICD-10-CM

## 2018-04-11 LAB — POCT URINALYSIS DIPSTICK OB
Bilirubin, UA: NEGATIVE
Glucose, UA: NEGATIVE
Ketones, UA: NEGATIVE
Nitrite, UA: NEGATIVE
POC,PROTEIN,UA: NEGATIVE
Spec Grav, UA: 1.02 (ref 1.010–1.025)
Urobilinogen, UA: 0.2 E.U./dL
pH, UA: 7 (ref 5.0–8.0)

## 2018-04-11 NOTE — Progress Notes (Signed)
ROB doing well. Feels good movement. SVE per pt request. Unable to reach presenting part. Pt state that she had a 4D u/s recently and head was down. Cervix unchanged . Reviewed labor precautions . Follow up 2 wks.   Philip Aspen, CNM

## 2018-04-11 NOTE — Progress Notes (Signed)
ROB- pt is having some pelvic pressure 

## 2018-04-11 NOTE — Patient Instructions (Signed)
Braxton Hicks Contractions Contractions of the uterus can occur throughout pregnancy, but they are not always a sign that you are in labor. You may have practice contractions called Braxton Hicks contractions. These false labor contractions are sometimes confused with true labor. What are Braxton Hicks contractions? Braxton Hicks contractions are tightening movements that occur in the muscles of the uterus before labor. Unlike true labor contractions, these contractions do not result in opening (dilation) and thinning of the cervix. Toward the end of pregnancy (32-34 weeks), Braxton Hicks contractions can happen more often and may become stronger. These contractions are sometimes difficult to tell apart from true labor because they can be very uncomfortable. You should not feel embarrassed if you go to the hospital with false labor. Sometimes, the only way to tell if you are in true labor is for your health care provider to look for changes in the cervix. The health care provider will do a physical exam and may monitor your contractions. If you are not in true labor, the exam should show that your cervix is not dilating and your water has not broken. If there are no other health problems associated with your pregnancy, it is completely safe for you to be sent home with false labor. You may continue to have Braxton Hicks contractions until you go into true labor. How to tell the difference between true labor and false labor True labor  Contractions last 30-70 seconds.  Contractions become very regular.  Discomfort is usually felt in the top of the uterus, and it spreads to the lower abdomen and low back.  Contractions do not go away with walking.  Contractions usually become more intense and increase in frequency.  The cervix dilates and gets thinner. False labor  Contractions are usually shorter and not as strong as true labor contractions.  Contractions are usually irregular.  Contractions  are often felt in the front of the lower abdomen and in the groin.  Contractions may go away when you walk around or change positions while lying down.  Contractions get weaker and are shorter-lasting as time goes on.  The cervix usually does not dilate or become thin. Follow these instructions at home:   Take over-the-counter and prescription medicines only as told by your health care provider.  Keep up with your usual exercises and follow other instructions from your health care provider.  Eat and drink lightly if you think you are going into labor.  If Braxton Hicks contractions are making you uncomfortable: ? Change your position from lying down or resting to walking, or change from walking to resting. ? Sit and rest in a tub of warm water. ? Drink enough fluid to keep your urine pale yellow. Dehydration may cause these contractions. ? Do slow and deep breathing several times an hour.  Keep all follow-up prenatal visits as told by your health care provider. This is important. Contact a health care provider if:  You have a fever.  You have continuous pain in your abdomen. Get help right away if:  Your contractions become stronger, more regular, and closer together.  You have fluid leaking or gushing from your vagina.  You pass blood-tinged mucus (bloody show).  You have bleeding from your vagina.  You have low back pain that you never had before.  You feel your baby's head pushing down and causing pelvic pressure.  Your baby is not moving inside you as much as it used to. Summary  Contractions that occur before labor are   called Braxton Hicks contractions, false labor, or practice contractions.  Braxton Hicks contractions are usually shorter, weaker, farther apart, and less regular than true labor contractions. True labor contractions usually become progressively stronger and regular, and they become more frequent.  Manage discomfort from Braxton Hicks contractions  by changing position, resting in a warm bath, drinking plenty of water, or practicing deep breathing. This information is not intended to replace advice given to you by your health care provider. Make sure you discuss any questions you have with your health care provider. Document Released: 05/20/2016 Document Revised: 10/19/2016 Document Reviewed: 05/20/2016 Elsevier Interactive Patient Education  2019 Elsevier Inc.  

## 2018-04-24 ENCOUNTER — Telehealth: Payer: Self-pay

## 2018-04-24 NOTE — Telephone Encounter (Signed)
Coronavirus (COVID-19) Are you at risk?  Are you at risk for the Coronavirus (COVID-19)?  To be considered HIGH RISK for Coronavirus (COVID-19), you have to meet the following criteria:  . Traveled to [place], [place], [place], [place] or [place]; or in the [place] to [place], [place], [place], or [place]; and have fever, cough, and shortness of breath within the last 2 weeks of travel OR . Been in close contact with a person diagnosed with COVID-19 within the last 2 weeks and have fever, cough, and shortness of breath . IF YOU DO NOT MEET THESE CRITERIA, YOU ARE CONSIDERED LOW RISK FOR COVID-19.  What to do if you are HIGH RISK for COVID-19?  Marland Kitchen If you are having a medical emergency, call 911. . Seek medical care right away. Before you go to a doctor's office, urgent care or emergency department, call ahead and tell them about your recent travel, contact with someone diagnosed with COVID-19, and your symptoms. You should receive instructions from your physician's office regarding next steps of care.  . When you arrive at healthcare provider, tell the healthcare staff immediately you have returned from visiting [place], [place], [place], [place] or [place]; or traveled in the [place] to [place], [place], [place], or [place]; in the last two weeks or you have been in close contact with a person diagnosed with COVID-19 in the last 2 weeks.   . Tell the health care staff about your symptoms: fever, cough and shortness of breath. . After you have been seen by a medical provider, you will be either: o Tested for (COVID-19) and discharged home on quarantine except to seek medical care if symptoms worsen, and asked to  - Stay home and avoid contact with others until you get your results (4-5 days)  - Avoid travel on public transportation if possible (such as bus, train, or airplane) or o Sent to the Emergency Department by EMS for evaluation, COVID-19 testing, and possible  admission depending on your condition and test results.  What to do if you are LOW RISK for COVID-19?  Reduce your risk of any infection by using the same precautions used for avoiding the common cold or flu:  Marland Kitchen Wash your hands often with soap and warm water for at least 20 seconds.  If soap and water are not readily available, use an alcohol-based hand sanitizer with at least 60% alcohol.  . If coughing or sneezing, cover your mouth and nose by coughing or sneezing into the elbow areas of your shirt or coat, into a tissue or into your sleeve (not your hands). . Avoid shaking hands with others and consider head nods or verbal greetings only. . Avoid touching your eyes, nose, or mouth with unwashed hands.  . Avoid close contact with people who are Jamie Ortega. . Avoid places or events with large numbers of people in one location, like concerts or sporting events. . Carefully consider travel plans you have or are making. . If you are planning any travel outside or inside the [place], visit the CDC's Travelers' Health webpage for the latest health notices. . If you have some symptoms but not all symptoms, continue to monitor at home and seek medical attention if your symptoms worsen. . If you are having a medical emergency, call 911. 04/24/18 SCREENING NEG SLS  ADDITIONAL HEALTHCARE OPTIONS FOR PATIENTS  [facility] / e-Visit: [email or web address]         [facility]: [phone]  [facility]: [phone]                   [facility]: [phone]  

## 2018-04-24 NOTE — Telephone Encounter (Signed)
Screening done

## 2018-04-25 ENCOUNTER — Ambulatory Visit (INDEPENDENT_AMBULATORY_CARE_PROVIDER_SITE_OTHER): Payer: BLUE CROSS/BLUE SHIELD | Admitting: Obstetrics and Gynecology

## 2018-04-25 ENCOUNTER — Other Ambulatory Visit: Payer: Self-pay

## 2018-04-25 VITALS — BP 109/67 | HR 75 | Wt 229.2 lb

## 2018-04-25 DIAGNOSIS — Z3493 Encounter for supervision of normal pregnancy, unspecified, third trimester: Secondary | ICD-10-CM

## 2018-04-25 LAB — POCT URINALYSIS DIPSTICK OB
Bilirubin, UA: NEGATIVE
Blood, UA: NEGATIVE
Glucose, UA: NEGATIVE
Ketones, UA: NEGATIVE
Leukocytes, UA: NEGATIVE
POC,PROTEIN,UA: NEGATIVE
Spec Grav, UA: 1.015 (ref 1.010–1.025)
Urobilinogen, UA: 0.2 E.U./dL
pH, UA: 6 (ref 5.0–8.0)

## 2018-04-25 NOTE — Progress Notes (Signed)
ROB- reviewed negative GBS cultures, labor precautions discussed, covid-19 restrictions discussed.OP presentations, discussed positions that encourage rotation of infant head to OA.

## 2018-04-25 NOTE — Patient Instructions (Addendum)
FREQUENTLY ASKED QUESTIONS FOR OBSTETRICS/PEDIATRICS    Q: Why are visitor restrictions different for maternity care areas?  [place] is restricting visitors for the duration of the patient's hospitalization. The birth of a child involves the mother, considered the patient, and a birthing partner. These are unprecedented times and we are making the exception to allow a birthing partner to be a part of the patient unit. No other guests will be allowed in our [place] at [facility] and at [facility].   Q: Are credentialed doulas allowed to support their existing patients?  We acknowledge the value these doula partnerships offer our care teams and many birthing families in our communities. Each laboring mother is allowed one birthing partner of the patient's choosing for her entire hospitalization.   Q: Are visitor restrictions different for hospitalized children?  Pediatric patients (infants and children under 1 years of age), such as those in the Children's Unit, Pediatric ICU and NICU, will be allowed two visitors (parents or legal guardians)   Q: Are pregnant women at an increased risk for COVID-19?  The [employer] of Obstetricians and Gynecologists (ACOG) is monitoring closely the coronavirus pandemic. With the limited information available, data does not indicate pregnant women are at an increased risk. However, pregnant women are known to be at greater risk for respiratory infections like flu. With that in mind, expectant mothers are considered an at-risk population for COVID-19, according to ACOG.   Q: Are newborns at an increased risk for COVID-19?  A limited sample of COVID-19 data with newborns indicates the virus is not transferred to the infant during pregnancy. However, postpartum separation is recommended by the Centers for  Disease Control (CDC). As a result [place] recommends and strongly encourages temporary separation of moms and babies who test positive for COVID-19 or are awaiting results to rule out COVID-19 based on CDC guidelines.   Q: If you have a suspected case of COVID-19, is the NICU couplet care room an option?  No. If either patient is considered at-risk for having COVID-19, the [place] [facility] will not use the NICU couplet care rooms for that family.   Q: [place] is urging that elective procedures be postponed. What is considered elective for women's and children's service line?  NOT ELECTIVE: Obstetric procedures, even those with an element of choice on timing, are not considered elective. Circumcisions are considered elective procedures, however, these do not deplete blood products and other resources, which is the spirit in which the COVID-19 postponement of elective procedures was intended. Therefore, circumcisions will be allowed.   ELECTIVE: Postpartum tubal ligations are considered elective and should be postponed. Q&A for Obstetricians, Gynecologists and Pediatricians  Published April 07, 2018   [facility] supports as much as possible the medical care  team working with the patient's individual needs to address timing during these unprecedented times. We seek the support of our medical care team in preserving needed resources throughout our crisis response to COVID-19.   Q: How does COVID-19 impact breastfeeding?  Breastmilk is safe for your baby - even if the mother has tested positive for COVID-19. If a COVID-19+ mother decides to breastfeed while inpatient and after discharge, we suggest proper protective equipment be worn and hand hygiene be performed before and after feeding the infant. The new mother also has the option to pump her milk and have a healthy family member feed the baby to protect the baby from getting the virus.   Q: Should we urge  patients to avoid baby showers and large gatherings?  Yes. As has been recommended for all citizens in our communities, gatherings of 10 or more should be avoided - pregnant or not. Seek creative options for "hosting" baby showers through electronic means that honor the request for social distancing during this time of heightened awareness.   Q: Should patients miss their prenatal appointments?  No. Prenatal visits are NOT elective. While we want to limit contact and exposure, prenatal care is vital right now. Contact your physician's office if you have concerns about your visits. We are limiting outpatient office visits to the patient and one guest in order to reduce the potential for exposure.   Q: What if a pregnant woman feels sick? Should she miss her prenatal visit then?  A pregnant woman experiencing coronavirus-like symptoms (i.e., cough, fever, difficulty breathing, shortness of breath, gastrointestinal issues) should contact her pregnancy care provider by phone. Her medical professional can best determine whether she should use a video visit or possibly go to a collection site to be tested for COVID-19. Contacting her primary care provider or her pregnancy care provider is her first step.   Q: What can I do about childbirth education? All the classes are cancelled.  The [facility] [place] will offer online learning to support mothers on their journey. We currently offer Understanding Childbirth, Understanding Breastfeeding and Understanding Newborn Care as an online class. Please visit our website, [email or web address], to register for an online class.   Q: How can I keep from getting COVID-19? Q&A for Obstetricians, Gynecologists and Pediatricians  Published April 07, 2018   Together, we can reduce the risk of exposure to the virus and help you and your family remain healthy and safe. One of the best ways to protect yourself is to wash your hands frequently using soap and  water. Also, you should avoid touching your eyes, nose and mouth with unwashed hands, avoid physical contact with others and practice social distancing.   Q: How are employees being informed about what to do?  [place] leaders receive a daily COVID-19 update and share relevant information with their teams. This is a time when health care professionals are called on to lead within our community. We appreciate our staff's engagement with our COVID-19 updates and encourage them to share best practices on reducing the spread of the virus with our patients and community. We are prepared to provide the exceptional COVID-19 care and coordination our community needs, expects and deserves.   Q: Who's in charge of this issue at [facility]?  The leadership structure and process established to address COVID-19 includes Chief Physician Executive Phoebe Sharps, MD; Infection Prevention Medical Director Carlyle Basques, MD; and Infection Prevention Interim Director Hubert Azure, MSN, RN, CIC, CSPDT. A team  of [place] experts reflecting a broad spectrum of our workforce is meeting daily to evaluate new information we receive about WMGEE-03 and to adapt policies and practices accordingly.                         Published April 07, 2018   [email or web address]

## 2018-04-25 NOTE — Progress Notes (Signed)
ROB- pt is having pelvic pressure

## 2018-05-02 ENCOUNTER — Inpatient Hospital Stay: Payer: BLUE CROSS/BLUE SHIELD | Admitting: Certified Registered"

## 2018-05-02 ENCOUNTER — Encounter: Payer: Self-pay | Admitting: Certified Nurse Midwife

## 2018-05-02 ENCOUNTER — Inpatient Hospital Stay
Admission: EM | Admit: 2018-05-02 | Discharge: 2018-05-04 | DRG: 806 | Disposition: A | Discharge status: Home / Self Care | Payer: BLUE CROSS/BLUE SHIELD | Attending: Certified Nurse Midwife | Admitting: Certified Nurse Midwife

## 2018-05-02 ENCOUNTER — Other Ambulatory Visit: Payer: Self-pay

## 2018-05-02 DIAGNOSIS — D649 Anemia, unspecified: Secondary | ICD-10-CM | POA: Diagnosis not present

## 2018-05-02 DIAGNOSIS — O9902 Anemia complicating childbirth: Secondary | ICD-10-CM | POA: Diagnosis present

## 2018-05-02 DIAGNOSIS — A6000 Herpesviral infection of urogenital system, unspecified: Secondary | ICD-10-CM | POA: Diagnosis present

## 2018-05-02 DIAGNOSIS — Z3A39 39 weeks gestation of pregnancy: Secondary | ICD-10-CM

## 2018-05-02 DIAGNOSIS — O9832 Other infections with a predominantly sexual mode of transmission complicating childbirth: Secondary | ICD-10-CM | POA: Diagnosis not present

## 2018-05-02 DIAGNOSIS — O26893 Other specified pregnancy related conditions, third trimester: Secondary | ICD-10-CM | POA: Diagnosis not present

## 2018-05-02 DIAGNOSIS — R7989 Other specified abnormal findings of blood chemistry: Secondary | ICD-10-CM

## 2018-05-02 LAB — CBC
HCT: 33.8 % — ABNORMAL LOW (ref 36.0–46.0)
Hemoglobin: 10.8 g/dL — ABNORMAL LOW (ref 12.0–15.0)
MCH: 27.7 pg (ref 26.0–34.0)
MCHC: 32 g/dL (ref 30.0–36.0)
MCV: 86.7 fL (ref 80.0–100.0)
Platelets: 307 10*3/uL (ref 150–400)
RBC: 3.9 MIL/uL (ref 3.87–5.11)
RDW: 15.1 % (ref 11.5–15.5)
WBC: 11 10*3/uL — ABNORMAL HIGH (ref 4.0–10.5)
nRBC: 0 % (ref 0.0–0.2)

## 2018-05-02 LAB — TYPE AND SCREEN
ABO/RH(D): A POS
Antibody Screen: NEGATIVE

## 2018-05-02 MED ORDER — LIDOCAINE HCL (PF) 1 % IJ SOLN: 30.0000 mL | INTRAMUSCULAR | Status: DC | PRN

## 2018-05-02 MED ORDER — IBUPROFEN 600 MG PO TABS
600.0000 mg | ORAL_TABLET | Freq: Four times a day (QID) | ORAL | Status: DC
  Administered 2018-05-02 – 2018-05-04 (×7): 600 mg via ORAL
  Filled 2018-05-02 (×8): qty 1

## 2018-05-02 MED ORDER — ONDANSETRON HCL 4 MG/2ML IJ SOLN: 4.0000 mg | INTRAMUSCULAR | Status: DC | PRN

## 2018-05-02 MED ORDER — LACTATED RINGERS IV SOLN: INTRAVENOUS | Status: DC

## 2018-05-02 MED ORDER — FENTANYL 2.5 MCG/ML W/ROPIVACAINE 0.15% IN NS 100 ML EPIDURAL (ARMC): 12.0000 mL/h | EPIDURAL | Status: DC

## 2018-05-02 MED ORDER — FENTANYL 2.5 MCG/ML W/ROPIVACAINE 0.15% IN NS 100 ML EPIDURAL (ARMC)
EPIDURAL | Status: AC
  Filled 2018-05-02: qty 100

## 2018-05-02 MED ORDER — BUTORPHANOL TARTRATE 2 MG/ML IJ SOLN: 1.0000 mg | INTRAMUSCULAR | Status: DC | PRN

## 2018-05-02 MED ORDER — FENTANYL 2.5 MCG/ML W/ROPIVACAINE 0.15% IN NS 100 ML EPIDURAL (ARMC)
EPIDURAL | Status: DC | PRN
  Administered 2018-05-02: 12 mL/h via EPIDURAL

## 2018-05-02 MED ORDER — SIMETHICONE 80 MG PO CHEW: 80.0000 mg | CHEWABLE_TABLET | ORAL | Status: DC | PRN

## 2018-05-02 MED ORDER — EPHEDRINE 5 MG/ML INJ
10.0000 mg | INTRAVENOUS | Status: DC | PRN
  Filled 2018-05-02: qty 2

## 2018-05-02 MED ORDER — TERBUTALINE SULFATE 1 MG/ML IJ SOLN: 0.2500 mg | Freq: Once | INTRAMUSCULAR | Status: DC | PRN

## 2018-05-02 MED ORDER — ACETAMINOPHEN 325 MG PO TABS: 650.0000 mg | ORAL_TABLET | ORAL | Status: DC | PRN

## 2018-05-02 MED ORDER — BENZOCAINE-MENTHOL 20-0.5 % EX AERO
1.0000 "application " | INHALATION_SPRAY | CUTANEOUS | Status: DC | PRN
  Administered 2018-05-04: 1 via TOPICAL
  Filled 2018-05-02: qty 56

## 2018-05-02 MED ORDER — SENNOSIDES-DOCUSATE SODIUM 8.6-50 MG PO TABS
2.0000 | ORAL_TABLET | ORAL | Status: DC
  Administered 2018-05-03 – 2018-05-04 (×2): 2 via ORAL
  Filled 2018-05-02 (×3): qty 2

## 2018-05-02 MED ORDER — EPHEDRINE 5 MG/ML INJ
INTRAVENOUS | Status: AC
  Filled 2018-05-02: qty 4

## 2018-05-02 MED ORDER — ACETAMINOPHEN 325 MG PO TABS
650.0000 mg | ORAL_TABLET | ORAL | Status: DC | PRN
  Administered 2018-05-02 – 2018-05-04 (×4): 650 mg via ORAL
  Filled 2018-05-02 (×4): qty 2

## 2018-05-02 MED ORDER — LACTATED RINGERS IV SOLN: 500.0000 mL | Freq: Once | INTRAVENOUS | Status: DC

## 2018-05-02 MED ORDER — BUPIVACAINE HCL (PF) 0.25 % IJ SOLN
INTRAMUSCULAR | Status: DC | PRN
  Administered 2018-05-02: 3 mL via EPIDURAL
  Administered 2018-05-02: 5 mL via EPIDURAL

## 2018-05-02 MED ORDER — METHYLERGONOVINE MALEATE 0.2 MG PO TABS
0.2000 mg | ORAL_TABLET | ORAL | Status: DC | PRN
  Filled 2018-05-02: qty 1

## 2018-05-02 MED ORDER — PRENATAL MULTIVITAMIN CH
1.0000 | ORAL_TABLET | Freq: Every day | ORAL | Status: DC
  Filled 2018-05-02 (×2): qty 1

## 2018-05-02 MED ORDER — ONDANSETRON HCL 4 MG PO TABS: 4.0000 mg | ORAL_TABLET | ORAL | Status: DC | PRN

## 2018-05-02 MED ORDER — FENTANYL-BUPIVACAINE-NACL 0.5-0.125-0.9 MG/250ML-% EP SOLN: 12.0000 mL/h | EPIDURAL | Status: DC | PRN

## 2018-05-02 MED ORDER — OXYTOCIN 40 UNITS IN NORMAL SALINE INFUSION - SIMPLE MED
1.0000 m[IU]/min | INTRAVENOUS | Status: DC
  Administered 2018-05-02: 2 m[IU]/min via INTRAVENOUS

## 2018-05-02 MED ORDER — OXYTOCIN 40 UNITS IN NORMAL SALINE INFUSION - SIMPLE MED
2.5000 [IU]/h | INTRAVENOUS | Status: DC
  Filled 2018-05-02: qty 1000

## 2018-05-02 MED ORDER — PHENYLEPHRINE 40 MCG/ML (10ML) SYRINGE FOR IV PUSH (FOR BLOOD PRESSURE SUPPORT): 80.0000 ug | PREFILLED_SYRINGE | INTRAVENOUS | Status: DC | PRN

## 2018-05-02 MED ORDER — DIPHENHYDRAMINE HCL 50 MG/ML IJ SOLN: 12.5000 mg | INTRAMUSCULAR | Status: DC | PRN

## 2018-05-02 MED ORDER — LIDOCAINE HCL (PF) 1 % IJ SOLN
INTRAMUSCULAR | Status: DC | PRN
  Administered 2018-05-02: 3 mL via INTRADERMAL

## 2018-05-02 MED ORDER — SOD CITRATE-CITRIC ACID 500-334 MG/5ML PO SOLN: 30.0000 mL | ORAL | Status: DC | PRN

## 2018-05-02 MED ORDER — EPHEDRINE 5 MG/ML INJ
10.0000 mg | INTRAVENOUS | Status: DC | PRN
  Administered 2018-05-02: 10 mg via INTRAVENOUS
  Filled 2018-05-02: qty 2

## 2018-05-02 MED ORDER — DIBUCAINE (PERIANAL) 1 % EX OINT: 1.0000 "application " | TOPICAL_OINTMENT | CUTANEOUS | Status: DC | PRN

## 2018-05-02 MED ORDER — OXYCODONE-ACETAMINOPHEN 5-325 MG PO TABS: 2.0000 | ORAL_TABLET | ORAL | Status: DC | PRN

## 2018-05-02 MED ORDER — WITCH HAZEL-GLYCERIN EX PADS: 1.0000 "application " | MEDICATED_PAD | CUTANEOUS | Status: DC | PRN

## 2018-05-02 MED ORDER — FERROUS SULFATE 325 (65 FE) MG PO TABS
325.0000 mg | ORAL_TABLET | Freq: Every day | ORAL | Status: DC
  Administered 2018-05-03 – 2018-05-04 (×2): 325 mg via ORAL
  Filled 2018-05-02 (×2): qty 1

## 2018-05-02 MED ORDER — LACTATED RINGERS IV SOLN
500.0000 mL | INTRAVENOUS | Status: DC | PRN
  Administered 2018-05-02: 1000 mL via INTRAVENOUS

## 2018-05-02 MED ORDER — ONDANSETRON HCL 4 MG/2ML IJ SOLN: 4.0000 mg | Freq: Four times a day (QID) | INTRAMUSCULAR | Status: DC | PRN

## 2018-05-02 MED ORDER — METHYLERGONOVINE MALEATE 0.2 MG/ML IJ SOLN: 0.2000 mg | INTRAMUSCULAR | Status: DC | PRN

## 2018-05-02 MED ORDER — LIDOCAINE-EPINEPHRINE (PF) 1.5 %-1:200000 IJ SOLN
INTRAMUSCULAR | Status: DC | PRN
  Administered 2018-05-02: 3 mL via EPIDURAL

## 2018-05-02 MED ORDER — OXYCODONE-ACETAMINOPHEN 5-325 MG PO TABS: 1.0000 | ORAL_TABLET | ORAL | Status: DC | PRN

## 2018-05-02 MED ORDER — OXYTOCIN BOLUS FROM INFUSION
500.0000 mL | Freq: Once | INTRAVENOUS | Status: AC
  Administered 2018-05-02: 500 mL via INTRAVENOUS

## 2018-05-02 MED ORDER — COCONUT OIL OIL: 1.0000 "application " | TOPICAL_OIL | Status: DC | PRN

## 2018-05-02 MED ORDER — DOCUSATE SODIUM 100 MG PO CAPS
100.0000 mg | ORAL_CAPSULE | Freq: Two times a day (BID) | ORAL | Status: DC
  Administered 2018-05-02 – 2018-05-04 (×4): 100 mg via ORAL
  Filled 2018-05-02 (×4): qty 1

## 2018-05-02 NOTE — Anesthesia Preprocedure Evaluation (Signed)
Anesthesia Evaluation  Patient identified by MRN, date of birth, ID band Patient awake    Reviewed: Allergy & Precautions, H&P , NPO status , Patient's Chart, lab work & pertinent test results  Airway Mallampati: III  TM Distance: >3 FB Neck ROM: full    Dental  (+) Dental Advidsory Given   Pulmonary neg pulmonary ROS,           Cardiovascular negative cardio ROS       Neuro/Psych  Neuromuscular disease (c/o RLE sciatic pain with pregnancy) negative psych ROS   GI/Hepatic Neg liver ROS, GERD  ,  Endo/Other  Hypothyroidism   Renal/GU negative Renal ROS  negative genitourinary   Musculoskeletal   Abdominal   Peds  Hematology negative hematology ROS (+)   Anesthesia Other Findings   Reproductive/Obstetrics (+) Pregnancy                             Anesthesia Physical Anesthesia Plan  ASA: II  Anesthesia Plan: Epidural   Post-op Pain Management:    Induction:   PONV Risk Score and Plan:   Airway Management Planned:   Additional Equipment:   Intra-op Plan:   Post-operative Plan:   Informed Consent: I have reviewed the patients History and Physical, chart, labs and discussed the procedure including the risks, benefits and alternatives for the proposed anesthesia with the patient or authorized representative who has indicated his/her understanding and acceptance.       Plan Discussed with: CRNA and Anesthesiologist  Anesthesia Plan Comments:         Anesthesia Quick Evaluation

## 2018-05-02 NOTE — Lactation Note (Signed)
This note was copied from a baby's chart. Lactation Consultation Note  Patient Name: Jamie Ortega MPNTI'R Date: 05/02/2018 Reason for consult: Initial assessment   Maternal Data Has patient been taught Hand Expression?: Yes Does the patient have breastfeeding experience prior to this delivery?: Yes  Feeding Feeding Type: Breast Fed  LATCH Score Latch: Grasps breast easily, tongue down, lips flanged, rhythmical sucking.  Audible Swallowing: Spontaneous and intermittent  Type of Nipple: Everted at rest and after stimulation  Comfort (Breast/Nipple): Soft / non-tender  Hold (Positioning): No assistance needed to correctly position infant at breast.  LATCH Score: 10  Interventions Interventions: Breast feeding basics reviewed  Lactation Tools Discussed/Used     Consult Status Consult Status: Follow-up  MOB states that this is her second time breastfeeding baby since he was born. Baby latches on easily without assistance from RN or [place].   [place] reviewed breastfeeding basics with parents: positioning, latching, establishing milk supply, feeding cues, and how to prevent mastitis.  MOB states that she breastfed first baby for "a few months" but stopped after having mastitis.  LC will f/u with dyad tomorrow.   Marnee Spring 05/02/2018, 4:40 PM

## 2018-05-02 NOTE — OB Triage Note (Signed)
Pt states she started contracting at 4AM with some light pink vaginal spotting. Denies LOF and endorses +FM. Pt states pain is 5/10.

## 2018-05-02 NOTE — Anesthesia Procedure Notes (Addendum)
Epidural Patient location during procedure: OB  Staffing Anesthesiologist: Emmie Niemann, MD Resident/CRNA: Rolla Plate, CRNA Performed: resident/CRNA   Preanesthetic Checklist Completed: patient identified, site marked, surgical consent, pre-op evaluation, timeout performed, IV checked, risks and benefits discussed and monitors and equipment checked  Epidural Patient position: sitting Prep: ChloraPrep and site prepped and draped Patient monitoring: heart rate, continuous pulse ox and blood pressure Approach: midline Location: L4-L5 Injection technique: LOR saline  Needle:  Needle type: Tuohy  Needle gauge: 17 G Needle length: 9 cm and 9 Needle insertion depth: 8 cm Catheter type: closed end flexible Catheter size: 19 Gauge Catheter at skin depth: 13 cm Test dose: negative and 1.5% lidocaine with Epi 1:200 K  Assessment Events: blood not aspirated, injection not painful, no injection resistance, negative IV test and no paresthesia  Additional Notes   Patient tolerated the insertion well without complications.Reason for block:procedure for pain

## 2018-05-02 NOTE — H&P (Signed)
History and Physical   HPI  Jamie Ortega is a 33 y.o. [id number] at [redacted]w[redacted]d Estimated Date of Delivery: 05/04/18 who is being admitted for  labor management.   OB History  OB History  Gravida Para Term Preterm AB Living  6 2 2  0 3 2  SAB TAB Ectopic Multiple Live Births  0 0 0 0 2    # Outcome Date GA Lbr Len/2nd Weight Sex Delivery Anes PTL Lv  6 Current           5 Term      Vag-Spont   LIV  4 Term      Vag-Spont   LIV  3 AB           2 AB           1 AB             PROBLEM LIST  Pregnancy complications or risks: Patient Active Problem List   Diagnosis Date Noted  . Labor and delivery, indication for care 05/02/2018  . Back pain affecting pregnancy in third trimester 02/10/2018  . Right sciatic nerve pain 01/19/2018  . Low TSH level 10/25/2017  . Uterine leiomyoma 09/09/2017  . Genital herpes 05/20/2017  . Obesity (BMI 35.0-39.9 without comorbidity) 05/20/2017    Prenatal labs and studies: ABO, Rh: --/--/A POS (04/14 9381) Antibody: NEG (04/14 8299) Rubella: 1.41 (09/09 1602) RPR: Non Reactive (01/20 1008)  HBsAg: Negative (09/09 1602)  HIV: Non Reactive (09/09 1602)  BZJ:IRCVELFY (03/17 1441)   Past Medical History:  Diagnosis Date  . Genital herpes      Past Surgical History:  Procedure Laterality Date  . WISDOM TOOTH EXTRACTION       Medications    Current Discharge Medication List    CONTINUE these medications which have NOT CHANGED   Details  omeprazole (PRILOSEC) 10 MG capsule Take 10 mg by mouth daily.    Prenatal Vit-Fe Fumarate-FA (PRENATAL MULTIVITAMIN) TABS tablet Take 1 tablet by mouth daily at 12 noon.    valACYclovir (VALTREX) 500 MG tablet Take 1 tablet (500 mg total) by mouth 2 (two) times daily. Qty: 60 tablet, Refills: 6    cyclobenzaprine (FLEXERIL) 5 MG tablet Take 1 tablet (5 mg total) by mouth 3 (three) times daily as needed for muscle spasms. Qty: 30 tablet, Refills: 0         Allergies  Patient has no  known allergies.  Review of Systems  Constitutional: negative Eyes: negative Ears, nose, mouth, throat, and face: negative Respiratory: negative Cardiovascular: negative Gastrointestinal: negative Genitourinary:negative Integument/breast: negative Hematologic/lymphatic: negative Musculoskeletal:negative Neurological: negative Behavioral/Psych: negative Endocrine: negative Allergic/Immunologic: negative  Physical Exam  BP 107/65   Pulse 90   Temp 98.6 F (37 C) (Oral)   Resp 18   Ht 5\' 2"  (1.575 m)   Wt 103.9 kg   LMP 07/26/2017 (Exact Date)   SpO2 99%   BMI 41.88 kg/m   Lungs:  CTA B Cardio: RRR  Abd: Soft, gravid, NT Presentation: cephalic EXT: No C/C/ 1+ Edema DTRs: 2+ B CERVIX: Dilation: 6 Effacement (%): 90 Cervical Position: Posterior Station: -2 Presentation: Vertex Exam by:: Deneise Lever CNM   See Prenatal records for more detailed PE.     FHR:  Baseline: 145 bpm, Variability: Good {> 6 bpm), Accelerations: Non-reactive but appropriate for gestational age and Decelerations: early and variable   Toco: Uterine Contractions: Frequency: Every 3-4 minutes, Duration: 70-80 seconds and Intensity: moderate  Test Results  Results for  orders placed or performed during the hospital encounter of 05/02/18 (from the past 24 hour(s))  Type and screen [place]     Status: None   Collection Time: 05/02/18  8:33 AM  Result Value Ref Range   ABO/RH(D) A POS    Antibody Screen NEG    Sample Expiration      05/05/2018 Performed at [facility], [address] [place], [place] [address]   CBC     Status: Abnormal   Collection Time: 05/02/18  8:33 AM  Result Value Ref Range   WBC 11.0 (H) 4.0 - 10.5 K/uL   RBC 3.90 3.87 - 5.11 MIL/uL   Hemoglobin 10.8 (L) 12.0 - 15.0 g/dL   HCT 33.8 (L) 36.0 - 46.0 %   MCV 86.7 80.0 - 100.0 fL   MCH 27.7 26.0 - 34.0 pg   MCHC 32.0 30.0 - 36.0 g/dL   RDW 15.1 11.5 - 15.5 %   Platelets 307 150  - 400 K/uL   nRBC 0.0 0.0 - 0.2 %   Group B Strep negative  Assessment   [id number] at [redacted]w[redacted]d Estimated Date of Delivery: 05/04/18  The fetus is reassuring.   Patient Active Problem List   Diagnosis Date Noted  . Labor and delivery, indication for care 05/02/2018  . Back pain affecting pregnancy in third trimester 02/10/2018  . Right sciatic nerve pain 01/19/2018  . Low TSH level 10/25/2017  . Uterine leiomyoma 09/09/2017  . Genital herpes 05/20/2017  . Obesity (BMI 35.0-39.9 without comorbidity) 05/20/2017    Plan  1. Admit to L&D :   IV Pitocin augmentation 2. EFM:-- Category 2 3. Epidural placed 4. Admission labs completed 5. Anticipate NSVD  Philip Aspen, CNM  05/02/2018 10:37 AM

## 2018-05-03 LAB — CBC
HCT: 28.9 % — ABNORMAL LOW (ref 36.0–46.0)
Hemoglobin: 9.1 g/dL — ABNORMAL LOW (ref 12.0–15.0)
MCH: 28 pg (ref 26.0–34.0)
MCHC: 31.5 g/dL (ref 30.0–36.0)
MCV: 88.9 fL (ref 80.0–100.0)
Platelets: 247 10*3/uL (ref 150–400)
RBC: 3.25 MIL/uL — ABNORMAL LOW (ref 3.87–5.11)
RDW: 15.3 % (ref 11.5–15.5)
WBC: 10.8 10*3/uL — ABNORMAL HIGH (ref 4.0–10.5)
nRBC: 0 % (ref 0.0–0.2)

## 2018-05-03 LAB — RPR: RPR Ser Ql: NONREACTIVE

## 2018-05-03 MED ORDER — VITAFOL GUMMIES 3.33-0.333-34.8 MG PO CHEW: 1.0000 | CHEWABLE_TABLET | Freq: Every day | ORAL | 2 refills | Status: DC

## 2018-05-03 MED ORDER — VITAMIN D3 125 MCG (5000 UT) PO CAPS: 1.0000 | ORAL_CAPSULE | Freq: Every day | ORAL | 2 refills | Status: DC

## 2018-05-03 MED ORDER — FUSION PLUS PO CAPS: 1.0000 | ORAL_CAPSULE | Freq: Every day | ORAL | 1 refills | Status: DC

## 2018-05-03 NOTE — Discharge Summary (Signed)
Physician Obstetric Discharge Summary  Patient ID: Jamie Ortega MRN: [id number] DOB/AGE: [birth year] 33 y.o.   Date of Admission: 05/02/2018  Date of Discharge:   Admitting Diagnosis: Induction of labor at [redacted]w[redacted]d  Secondary Diagnosis: Anemia in pregnancy  Mode of Delivery: normal spontaneous vaginal delivery     Discharge Diagnosis: SVD, anemia   Intrapartum Procedures: Atificial rupture of membranes and pitocin augmentation   Post partum procedures: 1st degree repair  Complications: 1st degree perineal laceration   Brief Hospital Course  Jamie Ortega is a [id number] who had a SVD on 05/02/2018;  for further details of this, please refer to the delivey note.  Patient had an uncomplicated postpartum course.  By time of discharge on PPD#1, her pain was controlled on oral pain medications; she had appropriate lochia and was ambulating, voiding without difficulty and tolerating regular diet.  She was deemed stable for discharge to home.     Labs: CBC Latest Ref Rng & Units 05/03/2018 05/02/2018 02/06/2018  WBC 4.0 - 10.5 K/uL 10.8(H) 11.0(H) 11.0(H)  Hemoglobin 12.0 - 15.0 g/dL 9.1(L) 10.8(L) 10.9(L)  Hematocrit 36.0 - 46.0 % 28.9(L) 33.8(L) 33.5(L)  Platelets 150 - 400 K/uL 247 307 314   A POS  Physical exam:  Blood pressure 115/70, pulse 79, temperature 98.2 F (36.8 C), temperature source Oral, resp. rate 19, height 5\' 2"  (1.575 m), weight 103.9 kg, last menstrual period 07/26/2017, SpO2 100 %. General: alert and no distress Lochia: appropriate Abdomen: soft, NT Uterine Fundus: firm Extremities: No evidence of DVT seen on physical exam. No lower extremity edema.  Discharge Instructions: Per After Visit Summary. Activity: Advance as tolerated. Pelvic rest for 6 weeks.  Also refer to After Visit Summary Diet: Regular Medications: Allergies as of 05/03/2018   No Known Allergies     Medication List    STOP taking these medications   cyclobenzaprine 5 MG  tablet Commonly known as:  FLEXERIL   omeprazole 10 MG capsule Commonly known as:  PRILOSEC   prenatal multivitamin Tabs tablet     TAKE these medications   fluticasone 50 MCG/ACT nasal spray Commonly known as:  FLONASE Place 2 sprays into both nostrils daily.   Fusion Plus Caps Take 1 capsule by mouth daily.   valACYclovir 500 MG tablet Commonly known as:  VALTREX Take 1 tablet (500 mg total) by mouth 2 (two) times daily.   Vitafol Gummies 3.33-0.333-34.8 MG Chew Chew 1 tablet by mouth daily.   Vitamin D3 125 MCG (5000 UT) Caps Take 1 capsule (5,000 Units total) by mouth daily.      Outpatient follow up:  Postpartum contraception: condoms  Discharged Condition: good  Discharged to: home   Newborn Data: Disposition:home with mother, circumcision done this am.  Apgars: APGAR (1 MIN): 5   APGAR (5 MINS): 9   APGAR (10 MINS):    Baby Feeding: Breast  Russell Engelstad Rockney Ghee, CNM

## 2018-05-03 NOTE — Lactation Note (Signed)
This note was copied from a baby's chart. Lactation Consultation Note  Patient Name: Jamie Ortega NATFT'D Date: 05/03/2018     Maternal Data    Feeding Feeding Type: Breast Fed  LATCH Score                   Interventions    Lactation Tools Discussed/Used     Consult Status  Mother of baby states that breastfeedings are going well and that baby clusterfed last night. Parents say that baby likes sucking on his hands and inquired about it being a feeding cue. After LC discussed things with parents further, it sounds like baby has been sucking on his hands frequently even in utero and it is ok to give a pacifier AFTER infant has been fed well. [place] instructed parents to use infant smacking/licking lips as more of a feeding cue and the turning of his head towards their chests. LC gave parents cool gels and pacifier.  MOB stated that the baby sometimes doesn't latch correctly so LC told parents to call out for assistance with feeding.    Marnee Spring 05/03/2018, 11:10 AM

## 2018-05-03 NOTE — Anesthesia Postprocedure Evaluation (Signed)
Anesthesia Post Note  Patient: Jamie Ortega  Procedure(s) Performed: AN AD [place]  Patient location during evaluation: Mother Baby Anesthesia Type: Epidural Level of consciousness: oriented and awake and alert Pain management: pain level controlled Vital Signs Assessment: post-procedure vital signs reviewed and stable Respiratory status: spontaneous breathing and respiratory function stable Cardiovascular status: blood pressure returned to baseline and stable Postop Assessment: no headache, no backache, no apparent nausea or vomiting and able to ambulate Anesthetic complications: no     Last Vitals:  Vitals:   05/03/18 0358 05/03/18 0800  BP: 116/66 115/70  Pulse: 84 79  Resp: 18 19  Temp: 36.9 C 36.8 C  SpO2: 99% 100%    Last Pain:  Vitals:   05/03/18 0800  TempSrc: Oral  PainSc:                  Alison Stalling

## 2018-05-03 NOTE — Lactation Note (Signed)
This note was copied from a baby's chart. Lactation Consultation Note  Patient Name: Jamie Ortega [id number] Date: 05/03/2018 Reason for consult: Follow-up assessment;Mother's request   Maternal Data    Feeding Feeding Type: Breast Fed  LATCH Score Latch: Grasps breast easily, tongue down, lips flanged, rhythmical sucking.  Audible Swallowing: Spontaneous and intermittent  Type of Nipple: Everted at rest and after stimulation  Comfort (Breast/Nipple): Soft / non-tender  Hold (Positioning): Assistance needed to correctly position infant at breast and maintain latch.  LATCH Score: 9  Interventions Interventions: Breast feeding basics reviewed;Assisted with latch;Support pillows  Lactation Tools Discussed/Used     Consult Status Consult Status: Complete Date: 05/03/18  LC observed baby latching onto mother's breast and baby opens mouth wide and latches on with ease. Baby does curl in bottom lip so LC told parents to pull down on his chin with a finger to loosen his grip. MOB said that latch felt much better after doing this.  LC feels like bf is going well for family and family knows about resources and support after d/c.   Marnee Spring 05/03/2018, 12:34 PM

## 2018-05-04 ENCOUNTER — Other Ambulatory Visit: Payer: BLUE CROSS/BLUE SHIELD

## 2018-05-04 ENCOUNTER — Encounter: Payer: BLUE CROSS/BLUE SHIELD | Admitting: Certified Nurse Midwife

## 2018-05-04 ENCOUNTER — Inpatient Hospital Stay: Admission: AD | Admit: 2018-05-04 | Payer: Self-pay | Source: Home / Self Care

## 2018-05-04 NOTE — Lactation Note (Signed)
This note was copied from a baby's chart. Lactation Consultation Note  Patient Name: Jamie Ortega [id number]'C Date: 05/04/2018 Reason for consult: Follow-up assessment   Maternal Data Formula Feeding for Exclusion: No Has patient been taught Hand Expression?: Yes(reviewed)  Feeding Feeding Type: Breast Fed  Interventions  Motehr has a sore left nipple and is not feeding on that side. She had decrease in size compared to rigth because she is not feedng on that breast.   Lactation Tools Discussed/Used   We discussed breast pumping if that side is too sore and coconut oil.  Consult Status      Daryel November 05/04/2018, 10:46 AM

## 2018-05-04 NOTE — Progress Notes (Signed)
Patient discharged home with infant. Discharge instructions, prescriptions and follow up appointment given to and reviewed with patient. Patient verbalized understanding. Patient wheeled out with infant by Rn

## 2018-06-13 ENCOUNTER — Telehealth: Payer: Self-pay

## 2018-06-13 NOTE — Telephone Encounter (Signed)
Coronavirus (COVID-19) Are you at risk?  Are you at risk for the Coronavirus (COVID-19)?  To be considered HIGH RISK for Coronavirus (COVID-19), you have to meet the following criteria:  . Traveled to [place], [place], [place], [place] or [place]; or in the [place] to [place], [place], [place], or [place]; and have fever, cough, and shortness of breath within the last 2 weeks of travel OR . Been in close contact with a person diagnosed with COVID-19 within the last 2 weeks and have fever, cough, and shortness of breath . IF YOU DO NOT MEET THESE CRITERIA, YOU ARE CONSIDERED LOW RISK FOR COVID-19.  What to do if you are HIGH RISK for COVID-19?  Marland Kitchen If you are having a medical emergency, call 911. . Seek medical care right away. Before you go to a doctor's office, urgent care or emergency department, call ahead and tell them about your recent travel, contact with someone diagnosed with COVID-19, and your symptoms. You should receive instructions from your physician's office regarding next steps of care.  . When you arrive at healthcare provider, tell the healthcare staff immediately you have returned from visiting [place], [place], [place], [place] or [place]; or traveled in the [place] to [place], [place], [place], or [place]; in the last two weeks or you have been in close contact with a person diagnosed with COVID-19 in the last 2 weeks.   . Tell the health care staff about your symptoms: fever, cough and shortness of breath. . After you have been seen by a medical provider, you will be either: o Tested for (COVID-19) and discharged home on quarantine except to seek medical care if symptoms worsen, and asked to  - Stay home and avoid contact with others until you get your results (4-5 days)  - Avoid travel on public transportation if possible (such as bus, train, or airplane) or o Sent to the Emergency Department by EMS for evaluation, COVID-19 testing, and possible  admission depending on your condition and test results.  What to do if you are LOW RISK for COVID-19?  Reduce your risk of any infection by using the same precautions used for avoiding the common cold or flu:  Marland Kitchen Wash your hands often with soap and warm water for at least 20 seconds.  If soap and water are not readily available, use an alcohol-based hand sanitizer with at least 60% alcohol.  . If coughing or sneezing, cover your mouth and nose by coughing or sneezing into the elbow areas of your shirt or coat, into a tissue or into your sleeve (not your hands). . Avoid shaking hands with others and consider head nods or verbal greetings only. . Avoid touching your eyes, nose, or mouth with unwashed hands.  . Avoid close contact with people who are Yarethzi Branan. . Avoid places or events with large numbers of people in one location, like concerts or sporting events. . Carefully consider travel plans you have or are making. . If you are planning any travel outside or inside the [place], visit the CDC's Travelers' Health webpage for the latest health notices. . If you have some symptoms but not all symptoms, continue to monitor at home and seek medical attention if your symptoms worsen. . If you are having a medical emergency, call 911.  06/13/18 SCREENING NEG SLS ADDITIONAL HEALTHCARE OPTIONS FOR PATIENTS  [facility] / e-Visit: [email or web address]         [facility]: [phone]  [facility]: [phone]                   [facility]: [phone]  

## 2018-06-14 ENCOUNTER — Other Ambulatory Visit: Payer: Self-pay

## 2018-06-14 ENCOUNTER — Encounter: Payer: Self-pay | Admitting: Certified Nurse Midwife

## 2018-06-14 ENCOUNTER — Ambulatory Visit (INDEPENDENT_AMBULATORY_CARE_PROVIDER_SITE_OTHER): Payer: BLUE CROSS/BLUE SHIELD | Admitting: Certified Nurse Midwife

## 2018-06-14 NOTE — Progress Notes (Signed)
Subjective:    Jamie Ortega is a 33 y.o. [phone] Caucasian female who presents for a postpartum visit. She is 6 weeks postpartum following a spontaneous vaginal delivery at 39.5 gestational weeks. Anesthesia: epidural. I have fully reviewed the prenatal and intrapartum course. Postpartum course has been WNL. Baby's course has been WNL. Baby is feeding by breast. Bleeding no bleeding. Bowel function is normal. Bladder function is normal. Patient is not sexually active. . Contraception method is condoms. Postpartum depression screening: negative. Score 1.  Last pap 08/18/2016 and was negative, HPV negative.  The following portions of the patient's history were reviewed and updated as appropriate: allergies, current medications, past medical history, past surgical history and problem list.  Review of Systems Pertinent items are noted in HPI.   Vitals:   06/14/18 1450  BP: 95/71  Pulse: 73  Weight: 209 lb 1.6 oz (94.8 kg)  Height: 5\' 2"  (1.575 m)   No LMP recorded.  Objective:   General:  alert, cooperative and no distress   Breasts:  deferred, no complaints  Lungs: clear to auscultation bilaterally  Heart:  regular rate and rhythm  Abdomen: soft, nontender   Vulva: normal  Vagina: normal vagina  Cervix:  closed  Corpus: Well-involuted  Adnexa:  Non-palpable  Rectal Exam: no hemorrhoids        Assessment:   Postpartum exam 6 wks s/p SVD Breast feeding Depression screening Contraception counseling   Plan:  : condoms. She declines labs today.  Follow up in for annual physical exam or earlier if needed  Philip Aspen, CNM

## 2018-06-14 NOTE — Progress Notes (Signed)
Patient here for PPV, no complaints.

## 2018-06-14 NOTE — Patient Instructions (Signed)
Preventive Care 18-39 Years, Female Preventive care refers to lifestyle choices and visits with your health care provider that can promote health and wellness. What does preventive care include?   A yearly physical exam. This is also called an annual well check.  Dental exams once or twice a year.  Routine eye exams. Ask your health care provider how often you should have your eyes checked.  Personal lifestyle choices, including: ? Daily care of your teeth and gums. ? Regular physical activity. ? Eating a healthy diet. ? Avoiding tobacco and drug use. ? Limiting alcohol use. ? Practicing safe sex. ? Taking vitamin and mineral supplements as recommended by your health care provider. What happens during an annual well check? The services and screenings done by your health care provider during your annual well check will depend on your age, overall health, lifestyle risk factors, and family history of disease. Counseling Your health care provider may ask you questions about your:  Alcohol use.  Tobacco use.  Drug use.  Emotional well-being.  Home and relationship well-being.  Sexual activity.  Eating habits.  Work and work environment.  Method of birth control.  Menstrual cycle.  Pregnancy history. Screening You may have the following tests or measurements:  Height, weight, and BMI.  Diabetes screening. This is done by checking your blood sugar (glucose) after you have not eaten for a while (fasting).  Blood pressure.  Lipid and cholesterol levels. These may be checked every 5 years starting at age 20.  Skin check.  Hepatitis C blood test.  Hepatitis B blood test.  Sexually transmitted disease (STD) testing.  BRCA-related cancer screening. This may be done if you have a family history of breast, ovarian, tubal, or peritoneal cancers.  Pelvic exam and Pap test. This may be done every 3 years starting at age 21. Starting at age 30, this may be done every 5  years if you have a Pap test in combination with an HPV test. Discuss your test results, treatment options, and if necessary, the need for more tests with your health care provider. Vaccines Your health care provider may recommend certain vaccines, such as:  Influenza vaccine. This is recommended every year.  Tetanus, diphtheria, and acellular pertussis (Tdap, Td) vaccine. You may need a Td booster every 10 years.  Varicella vaccine. You may need this if you have not been vaccinated.  HPV vaccine. If you are 26 or younger, you may need three doses over 6 months.  Measles, mumps, and rubella (MMR) vaccine. You may need at least one dose of MMR. You may also need a second dose.  Pneumococcal 13-valent conjugate (PCV13) vaccine. You may need this if you have certain conditions and were not previously vaccinated.  Pneumococcal polysaccharide (PPSV23) vaccine. You may need one or two doses if you smoke cigarettes or if you have certain conditions.  Meningococcal vaccine. One dose is recommended if you are age 19-21 years and a first-year college student living in a residence hall, or if you have one of several medical conditions. You may also need additional booster doses.  Hepatitis A vaccine. You may need this if you have certain conditions or if you travel or work in places where you may be exposed to hepatitis A.  Hepatitis B vaccine. You may need this if you have certain conditions or if you travel or work in places where you may be exposed to hepatitis B.  Haemophilus influenzae type b (Hib) vaccine. You may need this if you   have certain risk factors. Talk to your health care provider about which screenings and vaccines you need and how often you need them. This information is not intended to replace advice given to you by your health care provider. Make sure you discuss any questions you have with your health care provider. Document Released: 03/02/2001 Document Revised: 08/17/2016  Document Reviewed: 11/05/2014 Elsevier Interactive Patient Education  2019 [employer].

## 2018-06-30 ENCOUNTER — Telehealth: Payer: Self-pay

## 2018-06-30 NOTE — Telephone Encounter (Signed)
LOV 11/28/17. Per Dr. Loanne Drilling, f/u in 1 mo. Called pt to schedule appt. LVM requesting returned call.

## 2018-07-05 ENCOUNTER — Telehealth: Payer: Self-pay

## 2018-07-05 NOTE — Telephone Encounter (Signed)
LOV 11/28/17. Per Dr. Loanne Drilling, f/u in 1 mo. Called pt to schedule appt. LVM requesting returned call.

## 2018-08-31 DIAGNOSIS — Z1159 Encounter for screening for other viral diseases: Secondary | ICD-10-CM | POA: Diagnosis not present

## 2018-09-01 DIAGNOSIS — Z1159 Encounter for screening for other viral diseases: Secondary | ICD-10-CM | POA: Diagnosis not present

## 2018-09-22 ENCOUNTER — Other Ambulatory Visit: Payer: Self-pay

## 2018-09-22 DIAGNOSIS — R6889 Other general symptoms and signs: Secondary | ICD-10-CM | POA: Diagnosis not present

## 2018-09-22 DIAGNOSIS — Z20822 Contact with and (suspected) exposure to covid-19: Secondary | ICD-10-CM

## 2018-09-23 LAB — NOVEL CORONAVIRUS, NAA: SARS-CoV-2, NAA: NOT DETECTED

## 2018-11-01 ENCOUNTER — Other Ambulatory Visit: Payer: Self-pay

## 2018-11-01 DIAGNOSIS — Z20822 Contact with and (suspected) exposure to covid-19: Secondary | ICD-10-CM

## 2018-11-01 DIAGNOSIS — Z20828 Contact with and (suspected) exposure to other viral communicable diseases: Secondary | ICD-10-CM | POA: Diagnosis not present

## 2018-11-03 LAB — NOVEL CORONAVIRUS, NAA: SARS-CoV-2, NAA: NOT DETECTED

## 2018-12-13 ENCOUNTER — Encounter: Payer: BLUE CROSS/BLUE SHIELD | Admitting: Certified Nurse Midwife

## 2019-02-13 ENCOUNTER — Ambulatory Visit: Payer: BC Managed Care – PPO | Attending: Internal Medicine

## 2019-02-13 DIAGNOSIS — Z20822 Contact with and (suspected) exposure to covid-19: Secondary | ICD-10-CM

## 2019-02-14 LAB — NOVEL CORONAVIRUS, NAA: SARS-CoV-2, NAA: NOT DETECTED

## 2019-02-22 IMAGING — US US ABDOMEN LIMITED
1 series · 15 of 25 positions shown · non-contrast
Comparison: None.

CLINICAL DATA: Right upper quadrant pain

EXAM:
ULTRASOUND ABDOMEN LIMITED RIGHT UPPER QUADRANT

[Series 1: us abdomen limited · 15 of 54 slices shown]
[im 1/54]
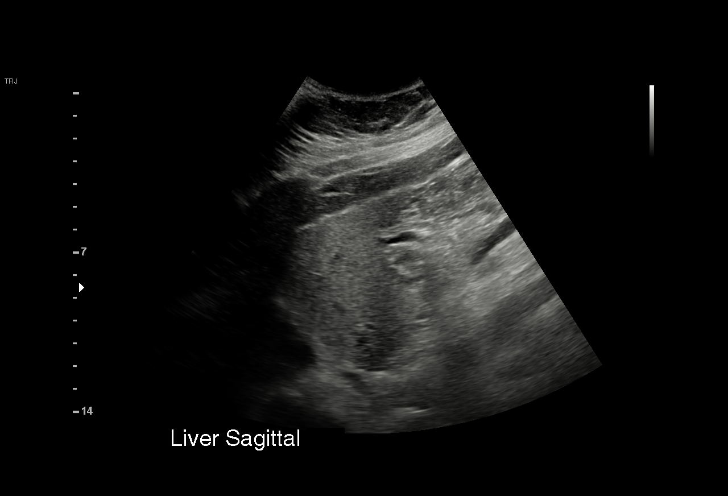
[im 5/54]
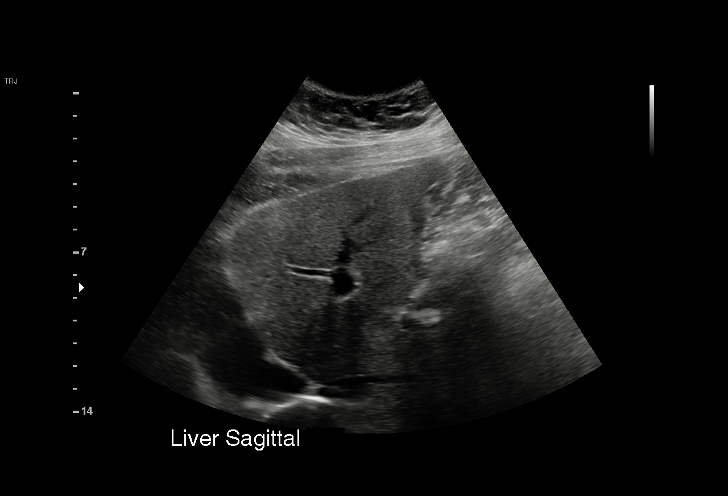
[im 9/54]
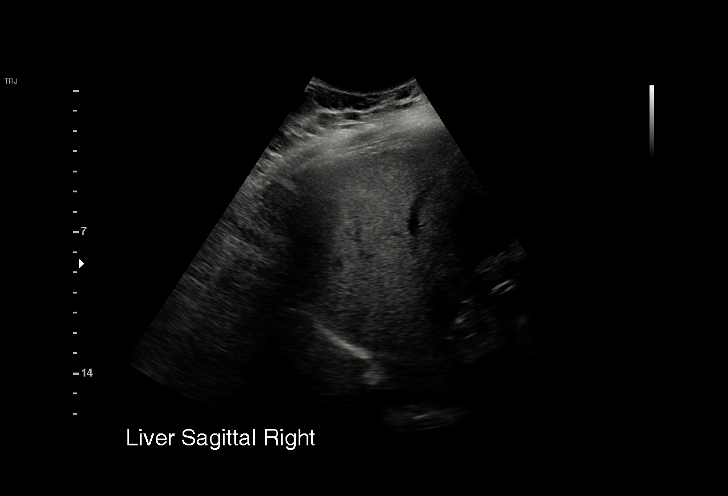
[im 12/54]
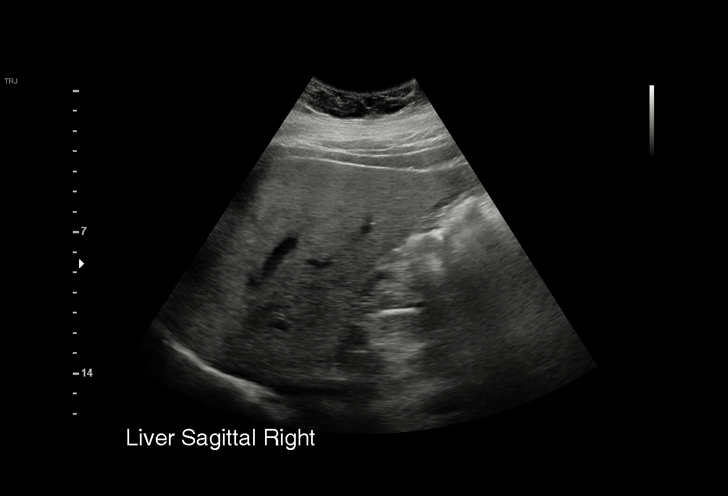
[im 16/54]
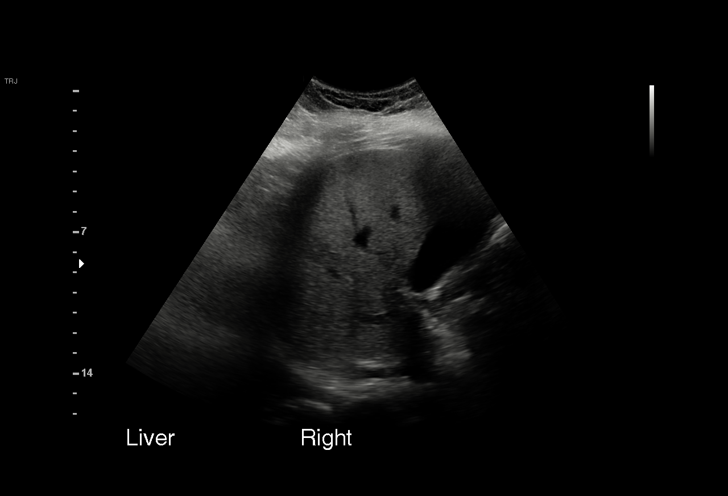
[im 20/54]
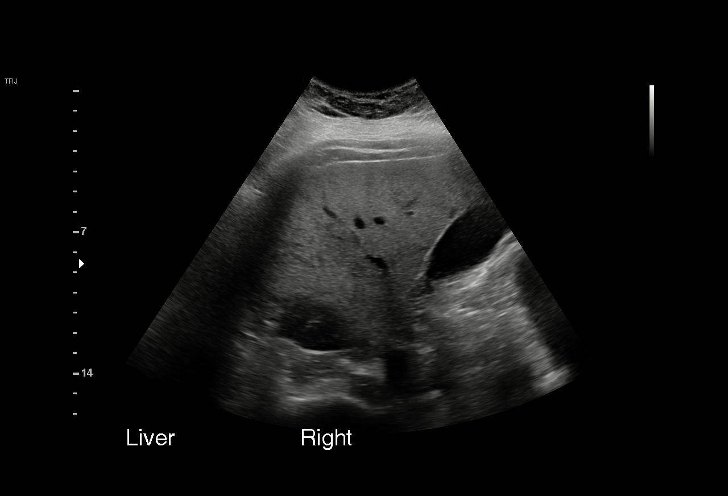
[im 23/54]
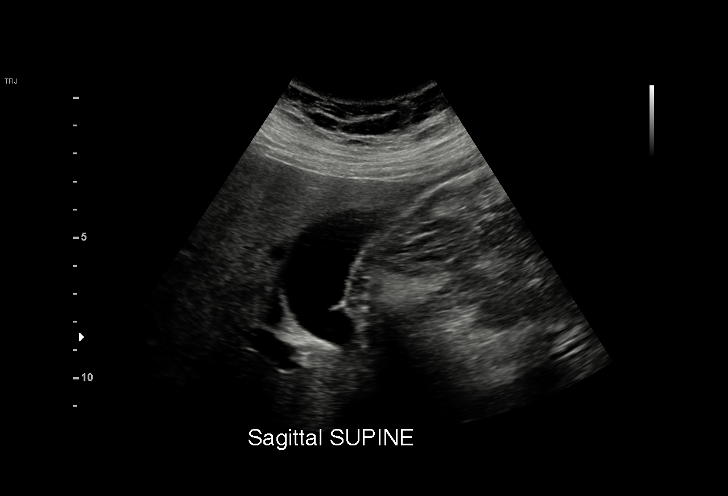
[im 27/54]
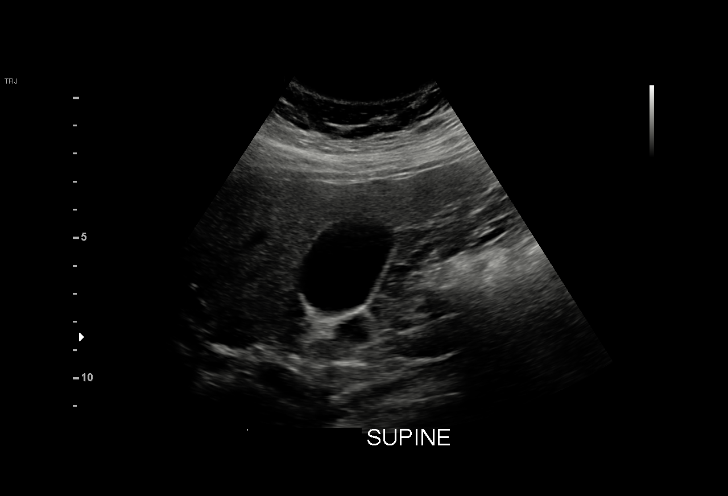
[im 31/54]
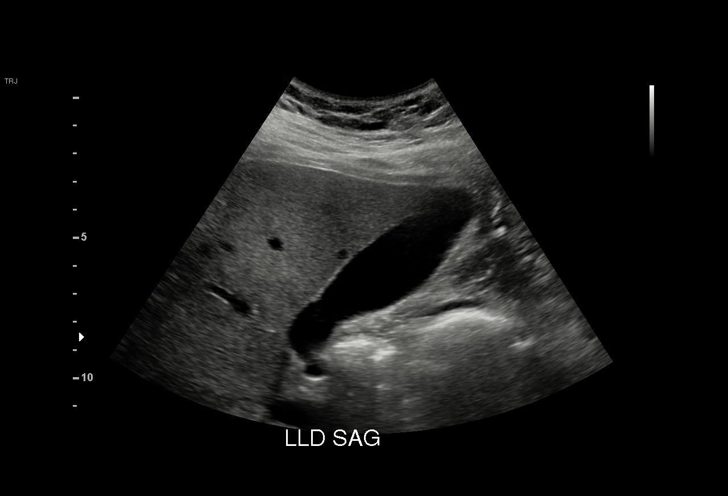
[im 34/54]
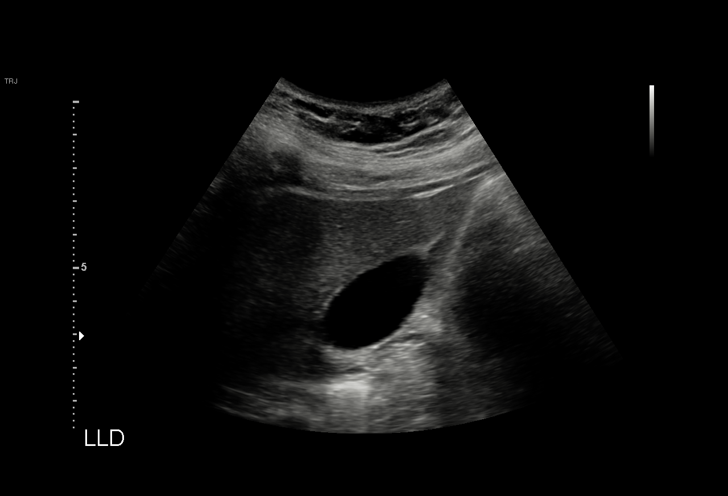
[im 38/54]
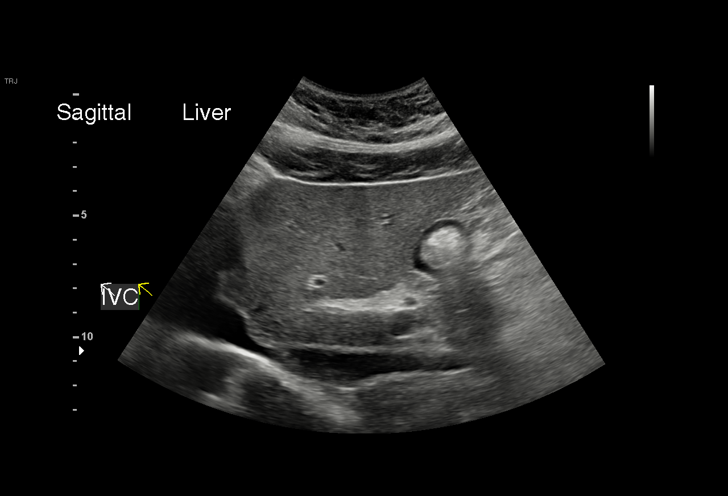
[im 42/54]
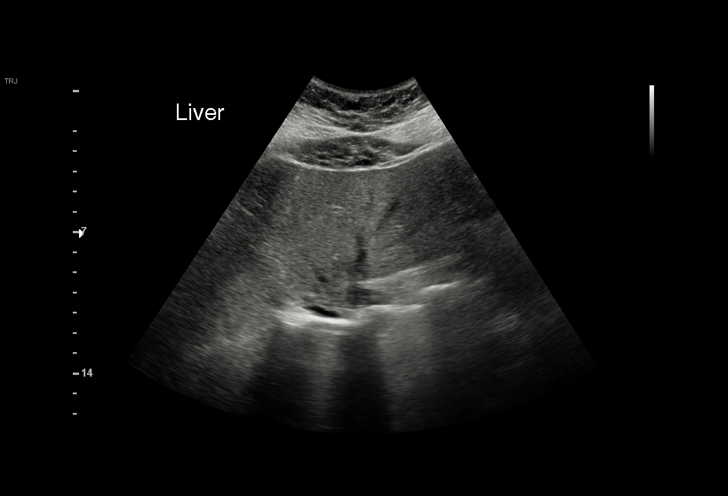
[im 45/54]
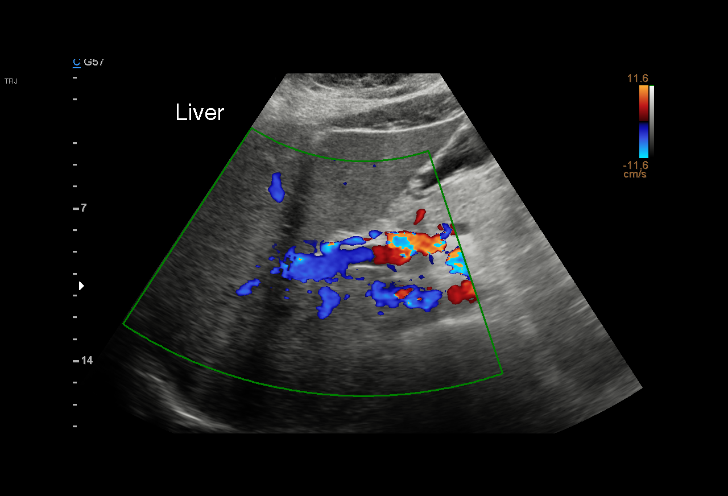
[im 49/54]
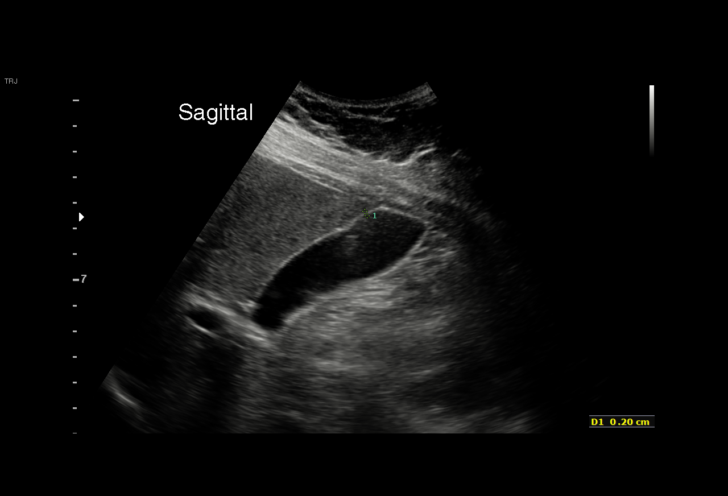
[im 54/54]
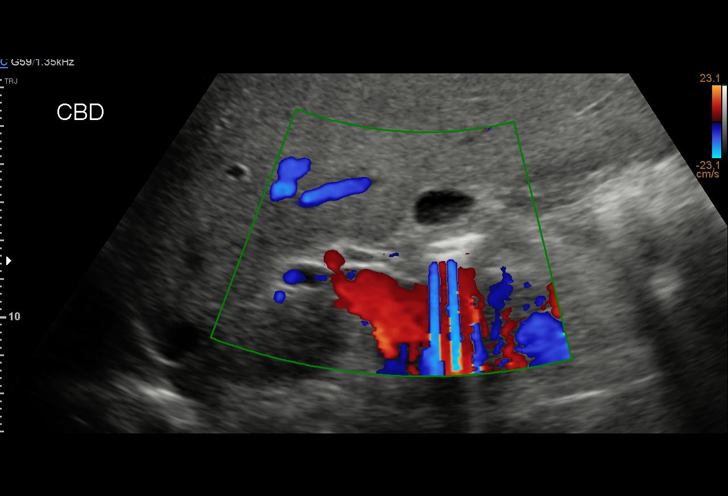

[15 of 25 positions shown; findings below may reference images not displayed]

FINDINGS: Gallbladder:

No gallstones or wall thickening visualized. There is no
pericholecystic fluid. No sonographic Murphy sign noted by
sonographer.

Common bile duct:

Diameter: 2 mm. No intrahepatic or extrahepatic biliary duct
dilatation.

Liver:

No focal lesion identified. Within normal limits in parenchymal
echogenicity. Portal vein is patent on color Doppler imaging with
normal direction of blood flow towards the liver.
IMPRESSION: Study within normal limits.

## 2019-03-06 ENCOUNTER — Other Ambulatory Visit: Payer: Self-pay | Admitting: Certified Nurse Midwife

## 2019-03-06 ENCOUNTER — Other Ambulatory Visit: Payer: Self-pay

## 2019-03-06 ENCOUNTER — Ambulatory Visit (INDEPENDENT_AMBULATORY_CARE_PROVIDER_SITE_OTHER): Payer: BC Managed Care – PPO | Admitting: Certified Nurse Midwife

## 2019-03-06 ENCOUNTER — Encounter: Payer: Self-pay | Admitting: Certified Nurse Midwife

## 2019-03-06 ENCOUNTER — Ambulatory Visit (INDEPENDENT_AMBULATORY_CARE_PROVIDER_SITE_OTHER): Payer: BC Managed Care – PPO

## 2019-03-06 VITALS — BP 102/58 | HR 74 | Ht 62.0 in | Wt 204.0 lb

## 2019-03-06 DIAGNOSIS — N939 Abnormal uterine and vaginal bleeding, unspecified: Secondary | ICD-10-CM

## 2019-03-06 DIAGNOSIS — N946 Dysmenorrhea, unspecified: Secondary | ICD-10-CM | POA: Diagnosis not present

## 2019-03-06 NOTE — Progress Notes (Signed)
GYN ENCOUNTER NOTE  Subjective:       Jamie Ortega is a 34 y.o. [phone] female is here for gynecologic evaluation of the following issues:  1. heavey painful period. Pt states that she stopped breastfeeding in September and since then her periods have been coming around the 11th of the month. This month her period started on the 14th, it has been heavy and painful. She has had nausea and vomiting episode earlier today and she has experienced a pulling sensation on her right side. She has not tried any medication for the pain. She got in the tub and that did help. Her family history is significant for  Endometriosis ( her mother has). .      Gynecologic History Patient's last menstrual period was 03/04/2019 (exact date). Contraception: condoms Last Pap: 08/18/2016 Results were: normal Last mammogram: n/a   Obstetric History OB History  Gravida Para Term Preterm AB Living  6 3 3  0 3 3  SAB TAB Ectopic Multiple Live Births  0 0 0 0 3    # Outcome Date GA Lbr Len/2nd Weight Sex Delivery Anes PTL Lv  6 Term 05/02/18   8 lb 2 oz (3.685 kg) M Vag-Spont   LIV  5 Term 10/27/10   7 lb 15 oz (3.6 kg) F Vag-Spont   LIV  4 AB 2008          3 AB 2007          2 AB 2006          1 Term 09/05/03   8 lb (3.629 kg) M Vag-Spont   LIV    Past Medical History:  Diagnosis Date  . Genital herpes     Past Surgical History:  Procedure Laterality Date  . WISDOM TOOTH EXTRACTION      Current Outpatient Medications on File Prior to Visit  Medication Sig Dispense Refill  . Cholecalciferol (VITAMIN D3) 125 MCG (5000 UT) CAPS Take 1 capsule (5,000 Units total) by mouth daily. (Patient not taking: Reported on 03/06/2019) 120 capsule 2  . fluticasone (FLONASE) 50 MCG/ACT nasal spray Place 2 sprays into both nostrils daily.    . Iron-FA-B Cmp-C-Biot-Probiotic (FUSION PLUS) CAPS Take 1 capsule by mouth daily. (Patient not taking: Reported on 03/06/2019) 60 capsule 1  . loratadine (CLARITIN) 10 MG  tablet Take 10 mg by mouth daily.    . Prenatal Vit-Fe Phos-FA-Omega (VITAFOL GUMMIES) 3.33-0.333-34.8 MG CHEW Chew 1 tablet by mouth daily. (Patient not taking: Reported on 03/06/2019) 90 tablet 2   No current facility-administered medications on file prior to visit.    No Known Allergies  Social History   Socioeconomic History  . Marital status: Married    Spouse name: Not on file  . Number of children: 2  . Years of education: Not on file  . Highest education level: Not on file  Occupational History  . Not on file  Tobacco Use  . Smoking status: Never Smoker  . Smokeless tobacco: Never Used  Substance and Sexual Activity  . Alcohol use: Not Currently    Comment: Socially  . Drug use: No  . Sexual activity: Not Currently    Partners: Male    Birth control/protection: None  Other Topics Concern  . Not on file  Social History Narrative   Married.   2 children.    Works at [employer] [place].    Enjoys kayaking, spending time outdoors.    Social Determinants of Health  Financial Resource Strain:   . Difficulty of Paying Living Expenses: Not on file  Food Insecurity:   . Worried About Charity fundraiser in the Last Year: Not on file  . Ran Out of Food in the Last Year: Not on file  Transportation Needs:   . Lack of Transportation (Medical): Not on file  . Lack of Transportation (Non-Medical): Not on file  Physical Activity:   . Days of Exercise per Week: Not on file  . Minutes of Exercise per Session: Not on file  Stress:   . Feeling of Stress : Not on file  Social Connections:   . Frequency of Communication with Friends and Family: Not on file  . Frequency of Social Gatherings with Friends and Family: Not on file  . Attends Religious Services: Not on file  . Active Member of Clubs or Organizations: Not on file  . Attends Archivist Meetings: Not on file  . Marital Status: Not on file  Intimate Partner Violence:   . Fear of Current or Ex-Partner: Not  on file  . Emotionally Abused: Not on file  . Physically Abused: Not on file  . Sexually Abused: Not on file    Family History  Problem Relation Age of Onset  . Cervical cancer Mother   . Endometriosis Mother   . Arthritis Mother   . Neuropathy Mother   . Hypertension Mother   . Anxiety disorder Mother   . Post-traumatic stress disorder Mother   . Hyperthyroidism Mother   . Hyperlipidemia Father   . Rheum arthritis Maternal Grandmother   . COPD Maternal Grandmother   . Emphysema Maternal Grandmother   . Prostate cancer Maternal Grandfather   . Cancer Paternal Grandfather        Jaw  . Ovarian cancer Neg Hx   . Colon cancer Neg Hx   . Breast cancer Neg Hx     The following portions of the patient's history were reviewed and updated as appropriate: allergies, current medications, past family history, past medical history, past social history, past surgical history and problem list.  Review of Systems Review of Systems - Negative except as mentioned in HPI Review of Systems - General ROS: negative for - chills, fatigue, fever, hot flashes, malaise or night sweats Hematological and Lymphatic ROS: negative for - bleeding problems or swollen lymph nodes Gastrointestinal ROS: negative for - abdominal pain, blood in stools, change in bowel habits and nausea/vomiting Musculoskeletal ROS: negative for - joint pain, muscle pain or muscular weakness Genito-Urinary ROS: negative for - change in menstrual cycle, dyspareunia, dysuria, genital discharge, genital ulcers, hematuria, incontinence, irregular/heavy menses, nocturia. Positive for  pelvic pain and  dysmenorrhea,  Objective:   BP (!) 102/58   Pulse 74   Ht 5\' 2"  (1.575 m)   Wt 204 lb (92.5 kg)   LMP 03/04/2019 (Exact Date)   BMI 37.31 kg/m  CONSTITUTIONAL: Well-developed, well-nourished female in no acute distress.  HENT:  Normocephalic, atraumatic.  NECK: Normal range of motion, supple, no masses.  Normal thyroid.  SKIN:  Skin is warm and dry. No rash noted. Not diaphoretic. No erythema. No pallor. [place]: Alert and oriented to person, place, and time. PSYCHIATRIC: Normal mood and affect. Normal behavior. Normal judgment and thought content. CARDIOVASCULAR:Not Examined RESPIRATORY: Not Examined BREASTS: Not Examined ABDOMEN: Soft, non distended; Non tender.  No Organomegaly. PELVIC:  External Genitalia: Normal  BUS: Normal  Vagina: Normal, blood present from cycle  Cervix: Normal,no polyps, blood present, no  cervical motion tenderness   Uterus: Normal size, shape,consistency, mobile  Adnexa: Normal, no pain on right side with exam  RV: Normal   Bladder: Nontender MUSCULOSKELETAL: Normal range of motion. No tenderness.  No cyanosis, clubbing, or edema.  Ultrasound today :  Patient Name: Jamie Ortega DOB: 06-27-1985 MRN: [id number] ULTRASOUND REPORT ULTRASOUND REPORT  Location: [facility]  Date of Service: 03/06/2019     Indications:Pelvic Pain Findings:  The uterus is anteverted and measures 8.5 x 3.9 x 5.8 cm. Echo texture is homogenous without evidence of focal masses.   The Endometrium measures 4 mm.  Right Ovary measures 2.6 x 2.0 x 2.2 cm. It is normal in appearance. Left Ovary measures 2.9 x 1.9 x 2.0 cm. It is normal in appearance. Survey of the adnexa demonstrates no adnexal masses. There is no free fluid in the cul de sac.  Impression: 1. Pelvic ultrasound is WNL at this time.  Recommendations: 1.Clinical correlation with the patient's History and Physical Exam.   Jenine M. Albertine Grates    RDMS  Assessment:   Heavy bleeding Painful period   Plan:   Reviewed results of ultrasound. Pt encouraged to take motrin 800 mg q 8 hrs and tylenol for pain. Discussed use of BC to help with bleeding and pain management. Should would like to try the motrin first. Also discussed use of lysteda if period more than a week in length. She verbalizes and agrees to plan of care.  Follow up prn.   Philip Aspen, CNM

## 2019-03-06 NOTE — Patient Instructions (Signed)
Abnormal Uterine Bleeding °Abnormal uterine bleeding means bleeding more than usual from your uterus. It can include: °· Bleeding between periods. °· Bleeding after sex. °· Bleeding that is heavier than normal. °· Periods that last longer than usual. °· Bleeding after you have stopped having your period (menopause). °There are many problems that may cause this. You should see a doctor for any kind of bleeding that is not normal. Treatment depends on the cause of the bleeding. °Follow these instructions at home: °· Watch your condition for any changes. °· Do not use tampons, douche, or have sex, if your doctor tells you not to. °· Change your pads often. °· Get regular well-woman exams. Make sure they include a pelvic exam and cervical cancer screening. °· Keep all follow-up visits as told by your doctor. This is important. °Contact a doctor if: °· The bleeding lasts more than one week. °· You feel dizzy at times. °· You feel like you are going to throw up (nauseous). °· You throw up. °Get help right away if: °· You pass out. °· You have to change pads every hour. °· You have belly (abdominal) pain. °· You have a fever. °· You get sweaty. °· You get weak. °· You passing large blood clots from your vagina. °Summary °· Abnormal uterine bleeding means bleeding more than usual from your uterus. °· There are many problems that may cause this. You should see a doctor for any kind of bleeding that is not normal. °· Treatment depends on the cause of the bleeding. °This information is not intended to replace advice given to you by your health care provider. Make sure you discuss any questions you have with your health care provider. °Document Revised: 12/30/2015 Document Reviewed: 12/30/2015 °Elsevier Patient Education © 2020 Elsevier Inc. ° °

## 2019-03-23 ENCOUNTER — Telehealth: Payer: Self-pay

## 2019-03-23 NOTE — Telephone Encounter (Signed)
Pt aware for dos 2/16 the service area [place] her insurance was lapsed. I have updated that in the system and reached out to billing(Stacy) to fix it for that [place]. Pt appreciative of call. If not resolved in 30 days pt to contact the office.

## 2019-04-26 ENCOUNTER — Ambulatory Visit: Payer: BC Managed Care – PPO | Attending: Internal Medicine

## 2019-04-26 DIAGNOSIS — Z23 Encounter for immunization: Secondary | ICD-10-CM

## 2019-04-26 NOTE — Progress Notes (Signed)
   Covid-19 Vaccination Clinic  Name:  Jamie Ortega    MRN: [id number] DOB: 09-21-1985  04/26/2019  Ms. [name] was observed post Covid-19 immunization for 15 minutes without incident. She was provided with Vaccine Information Sheet and instruction to access the V-Safe system.   Ms. [name] was instructed to call 911 with any severe reactions post vaccine: Marland Kitchen Difficulty breathing  . Swelling of face and throat  . A fast heartbeat  . A bad rash all over body  . Dizziness and weakness   Immunizations Administered    Name Date Dose VIS Date Route   Pfizer COVID-19 Vaccine 04/26/2019  3:21 PM 0.3 mL 12/29/2018 Intramuscular   Manufacturer: [place]   Lot: [id number]   [place]: [id number]

## 2019-05-23 ENCOUNTER — Ambulatory Visit: Payer: BC Managed Care – PPO | Attending: Internal Medicine

## 2019-05-23 DIAGNOSIS — Z23 Encounter for immunization: Secondary | ICD-10-CM

## 2019-05-23 NOTE — Progress Notes (Signed)
   Covid-19 Vaccination Clinic  Name:  Shalana Leff    MRN: [id number] DOB: [birth year]  05/23/2019  Ms. Slifer was observed post Covid-19 immunization for 15 minutes without incident. She was provided with Vaccine Information Sheet and instruction to access the V-Safe system.   Ms. Espitia was instructed to call 911 with any severe reactions post vaccine: Marland Kitchen Difficulty breathing  . Swelling of face and throat  . A fast heartbeat  . A bad rash all over body  . Dizziness and weakness   Immunizations Administered    Name Date Dose VIS Date Route   Pfizer COVID-19 Vaccine 05/23/2019  8:25 AM 0.3 mL 03/14/2018 Intramuscular   Manufacturer: [place]   Lot: [id number]   [place]: [id number]

## 2019-10-03 ENCOUNTER — Other Ambulatory Visit: Payer: Self-pay

## 2019-10-03 ENCOUNTER — Other Ambulatory Visit: Payer: BC Managed Care – PPO

## 2019-10-03 DIAGNOSIS — Z20822 Contact with and (suspected) exposure to covid-19: Secondary | ICD-10-CM | POA: Diagnosis not present

## 2019-10-06 LAB — NOVEL CORONAVIRUS, NAA: SARS-CoV-2, NAA: NOT DETECTED

## 2019-11-29 ENCOUNTER — Telehealth: Payer: Self-pay

## 2019-11-29 NOTE — Telephone Encounter (Signed)
mychart message sent to patient

## 2019-11-29 NOTE — Telephone Encounter (Signed)
mychart message sent to patient in response to her telephone message.

## 2019-11-29 NOTE — Telephone Encounter (Signed)
Patient called in and stated that her periods have always been right on time, but the past two she's had has been 5 days late. Patient is unsure whether she needs to come in to be seen, if this is something that she should be concerned about. Could you please advise?

## 2019-12-24 IMAGING — US US OB COMP LESS 14 WK
1 series · 14 of 28 positions shown · non-contrast
Comparison: None.

CLINICAL DATA: First-trimester pregnancy.  Vaginal bleeding.

EXAM:
OBSTETRIC <14 WK US AND TRANSVAGINAL OB US
TECHNIQUE: Both transabdominal and transvaginal ultrasound examinations were
performed for complete evaluation of the gestation as well as the
maternal uterus, adnexal regions, and pelvic cul-de-sac.
Transvaginal technique was performed to assess early pregnancy.

[Series 1: us ob comp less 14 wk · 0.19mm/px · 79 acquisitions, 14 frames shown]
[im 3/79]
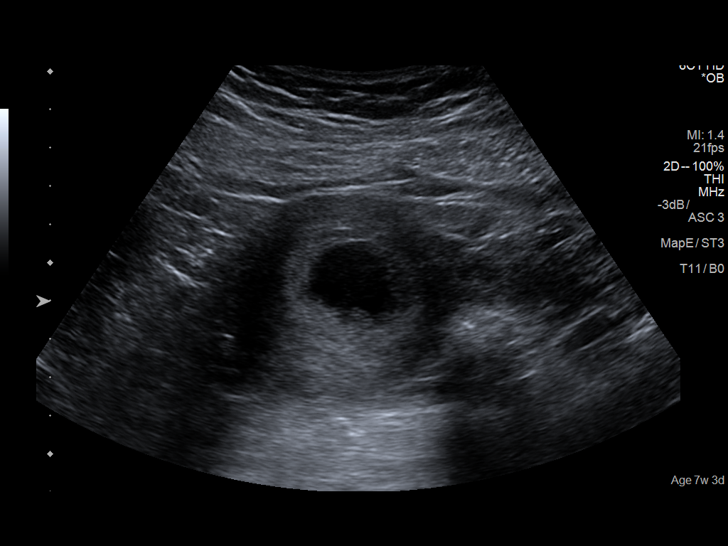
[im 9/79]
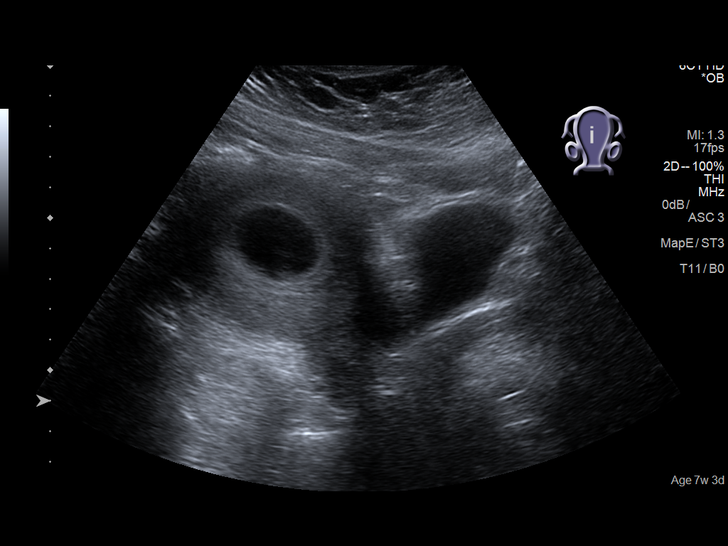
[im 15/79]
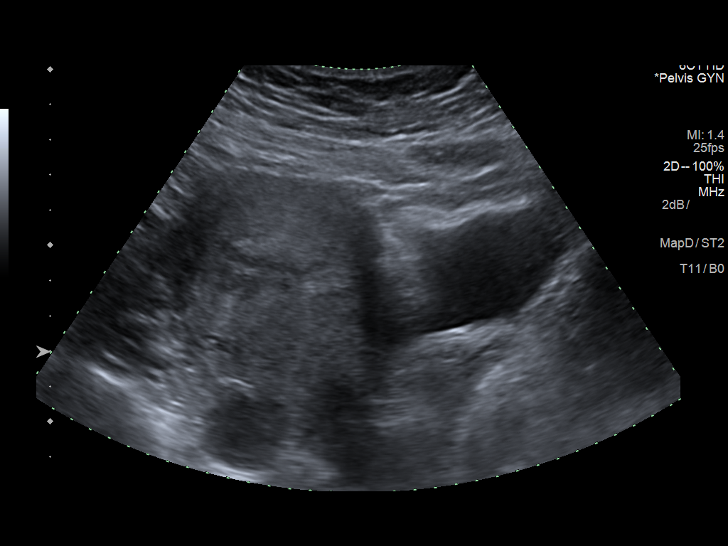
[im 21/79]
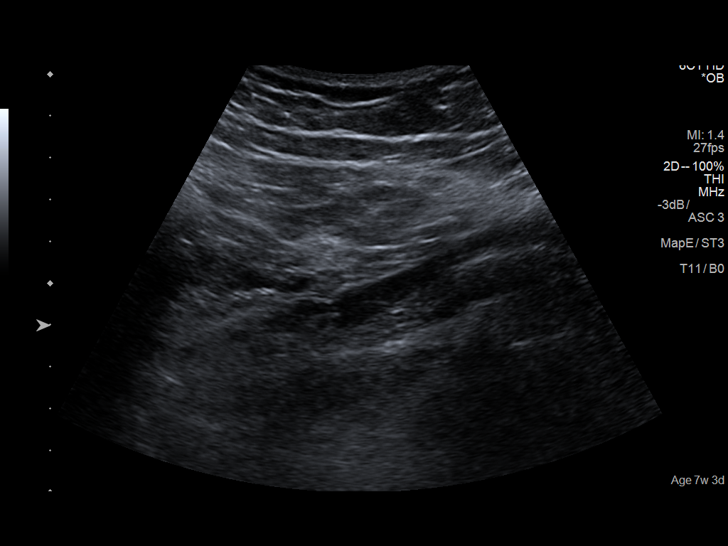
[im 27/79]
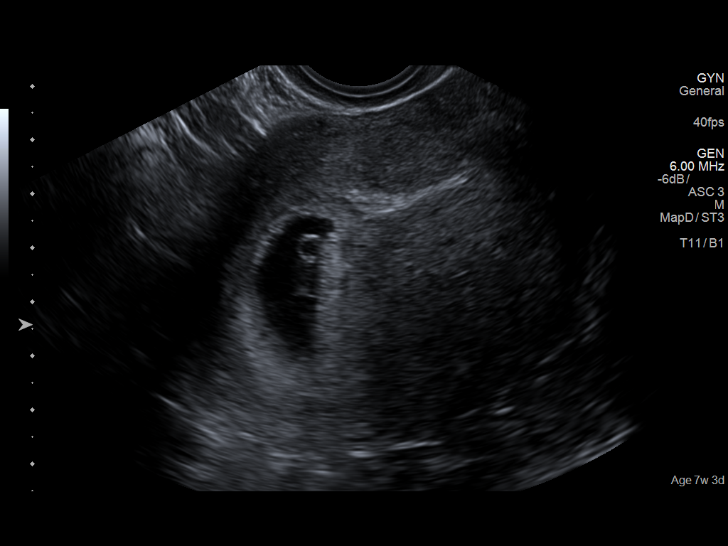
[im 32/79]
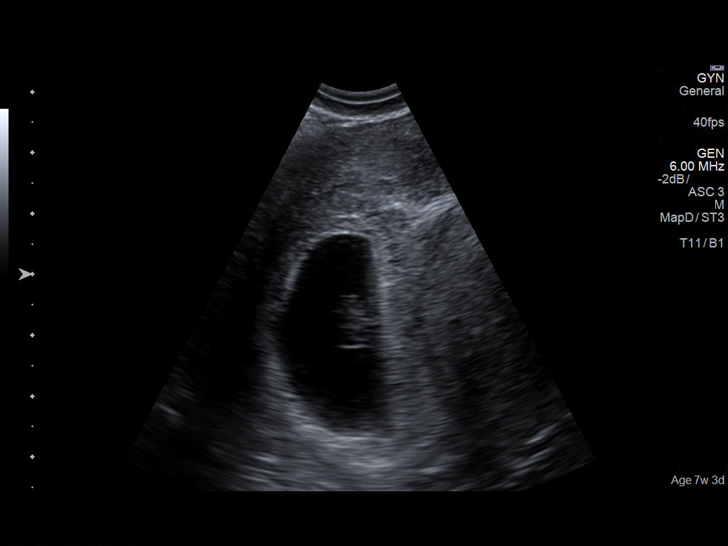
[im 38/79]
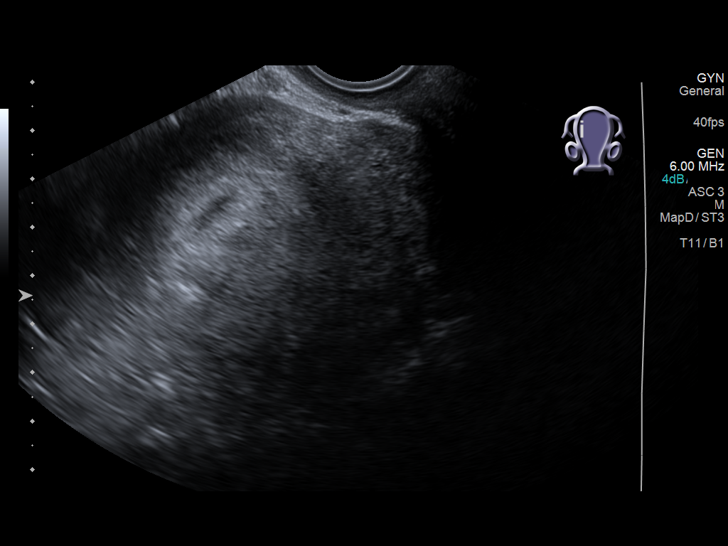
[im 44/79]
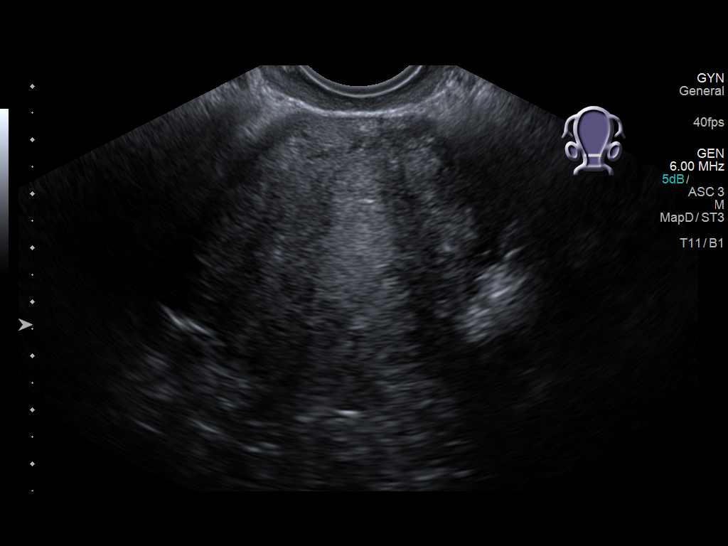
[im 50/79]
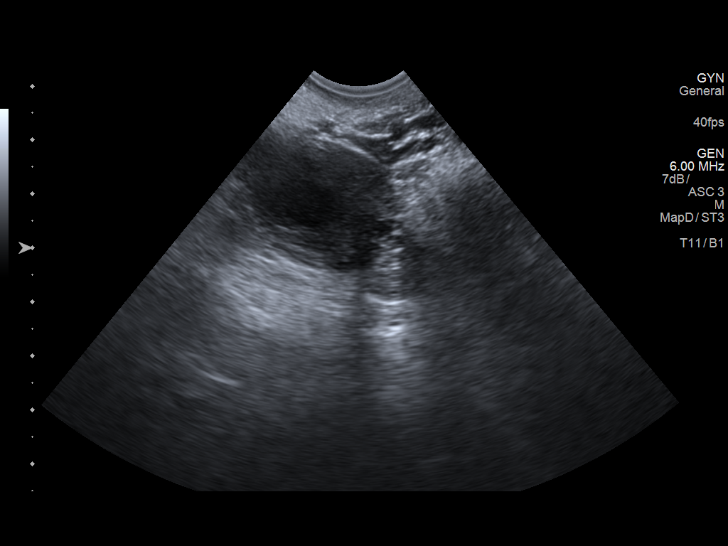
[im 55/79]
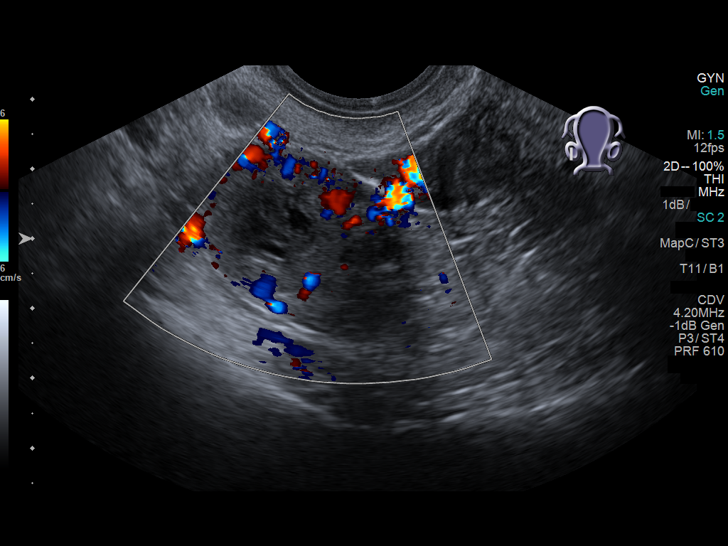
[im 61/79]
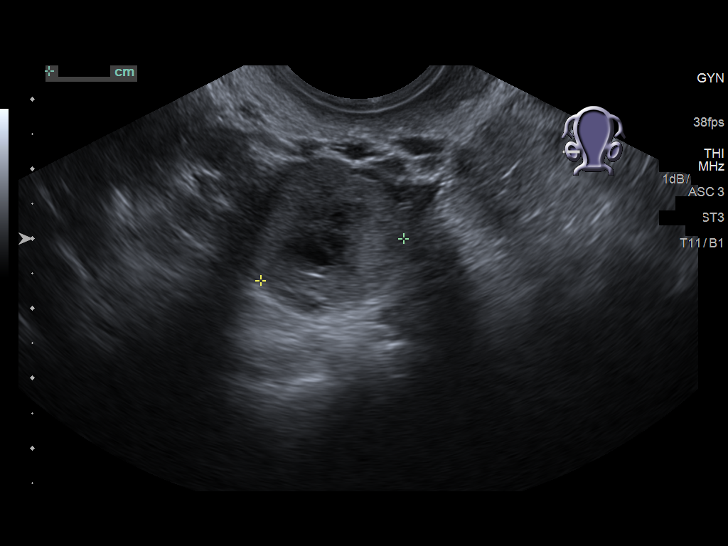
[im 67/79]
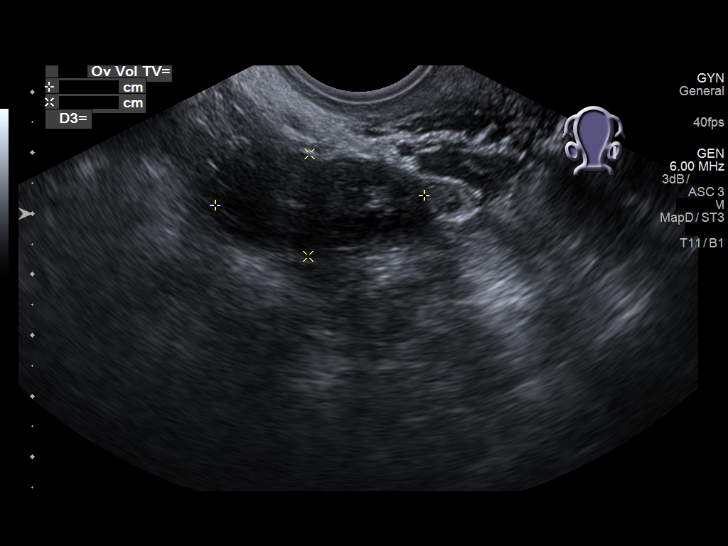
[im 73/79]
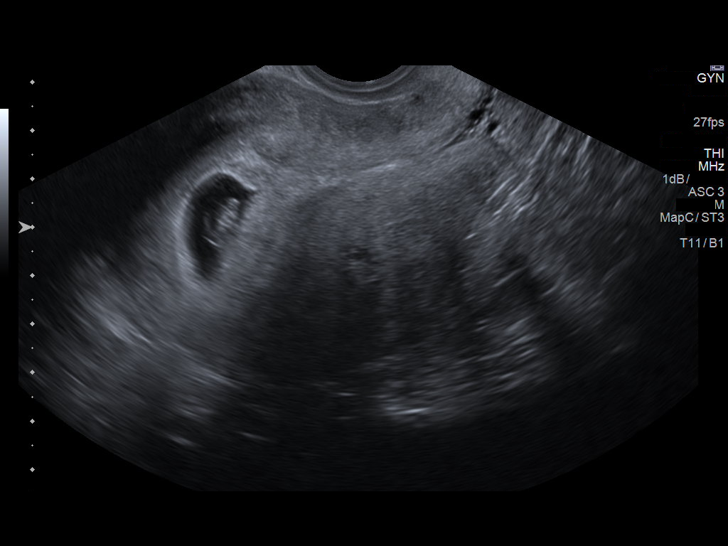
[im 79/79]
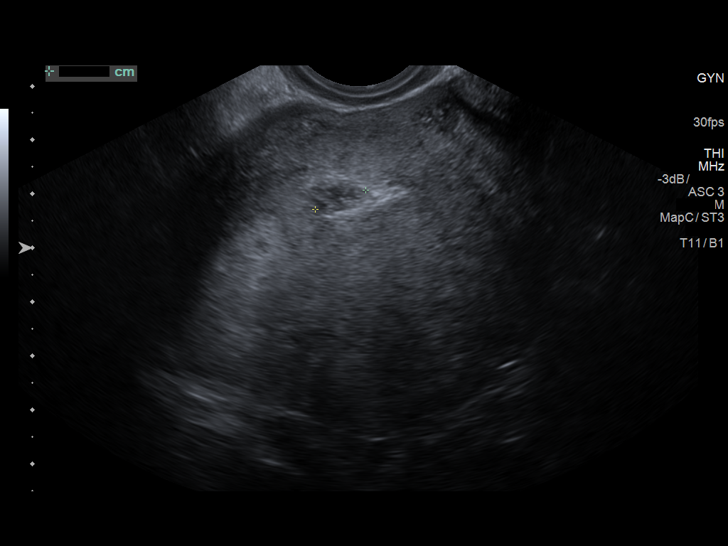

[14 of 28 positions shown; findings below may reference images not displayed]

FINDINGS: Intrauterine gestational sac: Single

Yolk sac:  Present

Embryo:  Present

Cardiac Activity: Present

Heart Rate: 153 bpm

CRL: 9.3 mm   7 w   0 d                  US EDC: 05/05/2018

Subchorionic hemorrhage: A small subchorionic hemorrhage is noted
along the inferior aspect of the gestational sac.

Maternal uterus/adnexae: Probable corpus luteal cyst is noted in the
right ovary. Adnexa are otherwise unremarkable.
IMPRESSION: 1. Single intrauterine pregnancy with a crown-rump length of 9.3 mm
provided estimated gestational age of 7 weeks and 0 days.
2. Small subchorionic hemorrhage inferior to the gestational sac
likely accounts for vaginal bleeding.

## 2019-12-28 ENCOUNTER — Telehealth: Payer: Self-pay

## 2019-12-28 NOTE — Telephone Encounter (Signed)
mychart message sent to patient

## 2020-01-23 ENCOUNTER — Other Ambulatory Visit: Payer: BC Managed Care – PPO

## 2020-01-23 DIAGNOSIS — Z20822 Contact with and (suspected) exposure to covid-19: Secondary | ICD-10-CM

## 2020-01-25 LAB — NOVEL CORONAVIRUS, NAA: SARS-CoV-2, NAA: NOT DETECTED

## 2020-01-25 LAB — SARS-COV-2, NAA 2 DAY TAT

## 2020-01-29 ENCOUNTER — Other Ambulatory Visit: Payer: Self-pay

## 2020-01-29 ENCOUNTER — Ambulatory Visit
Admission: EM | Admit: 2020-01-29 | Discharge: 2020-01-29 | Disposition: A | Discharge status: Home / Self Care | Payer: BC Managed Care – PPO | Attending: Family Medicine | Admitting: Family Medicine

## 2020-01-29 ENCOUNTER — Telehealth: Payer: BC Managed Care – PPO | Admitting: Family Medicine

## 2020-01-29 DIAGNOSIS — R059 Cough, unspecified: Secondary | ICD-10-CM | POA: Diagnosis not present

## 2020-01-29 MED ORDER — BENZONATATE 100 MG PO CAPS: 200.0000 mg | ORAL_CAPSULE | Freq: Three times a day (TID) | ORAL | 0 refills | Status: DC

## 2020-01-29 NOTE — Discharge Instructions (Signed)
Flu and COVID swab pending.  Tessalon Perles for cough as needed. Follow up as needed for continued or worsening symptoms

## 2020-01-29 NOTE — ED Triage Notes (Signed)
Starting 01/01 sore throat, fatigue, HA, COVID test negative.  Improved then started with nasal drainage, chest congestion, cough, difficulty sleeping.  Robitussin DM helps for a couple hours.

## 2020-01-30 NOTE — ED Provider Notes (Signed)
[name]    CSN: [id number] Arrival date & time: 01/29/20  1339      History   Chief Complaint Chief Complaint  Patient presents with  . Cough    HPI Jamie Ortega is a 35 y.o. female.   Patient is a 35 year old female presents today with sore throat, cough, fatigue, headache, nasal drainage, chest congestion.  This has been present since 1/1.  Had negative COVID testing.  Taking Robitussin-DM which helps for a couple of hours.     Past Medical History:  Diagnosis Date  . Genital herpes     Patient Active Problem List   Diagnosis Date Noted  . Labor and delivery, indication for care 05/02/2018  . Back pain affecting pregnancy in third trimester 02/10/2018  . Right sciatic nerve pain 01/19/2018  . Low TSH level 10/25/2017  . Uterine leiomyoma 09/09/2017  . Genital herpes 05/20/2017  . Obesity (BMI 35.0-39.9 without comorbidity) 05/20/2017    Past Surgical History:  Procedure Laterality Date  . WISDOM TOOTH EXTRACTION      OB History    Gravida  6   Para  3   Term  3   Preterm  0   AB  3   Living  3     SAB  0   IAB  0   Ectopic  0   Multiple  0   Live Births  3            Home Medications    Prior to Admission medications   Medication Sig Start Date End Date Taking? Authorizing Provider  benzonatate (TESSALON) 100 MG capsule Take 2 capsules (200 mg total) by mouth every 8 (eight) hours. 01/29/20  Yes Gorje Iyer A, NP  fluticasone (FLONASE) 50 MCG/ACT nasal spray Place 2 sprays into both nostrils daily. 04/18/18  Yes [provider]  loratadine (CLARITIN) 10 MG tablet Take 10 mg by mouth daily.   Yes [provider]    Family History Family History  Problem Relation Age of Onset  . Cervical cancer Mother   . Endometriosis Mother   . Arthritis Mother   . Neuropathy Mother   . Hypertension Mother   . Anxiety disorder Mother   . Post-traumatic stress disorder Mother   . Hyperthyroidism  Mother   . Hyperlipidemia Father   . Rheum arthritis Maternal Grandmother   . COPD Maternal Grandmother   . Emphysema Maternal Grandmother   . Prostate cancer Maternal Grandfather   . Cancer Paternal Grandfather        Jaw  . Ovarian cancer Neg Hx   . Colon cancer Neg Hx   . Breast cancer Neg Hx     Social History Social History   Tobacco Use  . Smoking status: Never Smoker  . Smokeless tobacco: Never Used  Vaping Use  . Vaping Use: Never used  Substance Use Topics  . Alcohol use: Not Currently    Comment: Socially  . Drug use: No     Allergies   Patient has no known allergies.   Review of Systems Review of Systems   Physical Exam Triage Vital Signs ED Triage Vitals  Enc Vitals Group     BP 01/29/20 1355 118/80     Pulse Rate 01/29/20 1355 62     Resp 01/29/20 1355 17     Temp 01/29/20 1355 97.9 F (36.6 C)     Temp Source 01/29/20 1355 Oral     SpO2 01/29/20 1355  98 %     Weight --      Height --      Head Circumference --      Peak Flow --      Pain Score 01/29/20 1358 0     Pain Loc --      Pain Edu? --      Excl. in [place]? --    No data found.  Updated Vital Signs BP 118/80 (BP Location: Left Arm)   Pulse 62   Temp 97.9 F (36.6 C) (Oral)   Resp 17   LMP 01/04/2020   SpO2 98%   Visual Acuity Right Eye Distance:   Left Eye Distance:   Bilateral Distance:    Right Eye Near:   Left Eye Near:    Bilateral Near:     Physical Exam Vitals and nursing note reviewed.  Constitutional:      General: She is not in acute distress.    Appearance: Normal appearance. She is not ill-appearing, toxic-appearing or diaphoretic.  HENT:     Head: Normocephalic.     Right Ear: Tympanic membrane and ear canal normal.     Left Ear: Tympanic membrane and ear canal normal.     Nose: Nose normal.     Mouth/Throat:     Pharynx: Oropharynx is clear.  Eyes:     Conjunctiva/sclera: Conjunctivae normal.  Cardiovascular:     Rate and Rhythm: Normal rate and  regular rhythm.  Pulmonary:     Effort: Pulmonary effort is normal.     Breath sounds: Normal breath sounds.  Musculoskeletal:        General: Normal range of motion.     Cervical back: Normal range of motion.  Skin:    General: Skin is warm and dry.     Findings: No rash.  Neurological:     Mental Status: She is alert.  Psychiatric:        Mood and Affect: Mood normal.      UC Treatments / Results  Labs (all labs ordered are listed, but only abnormal results are displayed) Labs Reviewed  COVID-19, FLU A+B NAA    EKG   Radiology No results found.  Procedures Procedures (including critical care time)  Medications Ordered in UC Medications - No data to display  Initial Impression / Assessment and Plan / UC Course  I have reviewed the triage vital signs and the nursing notes.  Pertinent labs & imaging results that were available during my care of the patient were reviewed by me and considered in my medical decision making (see chart for details).     Cough Nothing concerning on exam.  COVID and flu test pending.  Tessalon Perles for cough as needed. Follow up as needed for continued or worsening symptoms  Final Clinical Impressions(s) / UC Diagnoses   Final diagnoses:  Cough     Discharge Instructions     Flu and COVID swab pending.  Tessalon Perles for cough as needed. Follow up as needed for continued or worsening symptoms     ED Prescriptions    Medication Sig Dispense Auth. Provider   benzonatate (TESSALON) 100 MG capsule Take 2 capsules (200 mg total) by mouth every 8 (eight) hours. 45 capsule Minh Jasper A, NP     PDMP not reviewed this encounter.   Orvan July, NP 01/30/20 [phone]

## 2020-02-01 LAB — COVID-19, FLU A+B NAA

## 2020-02-12 ENCOUNTER — Ambulatory Visit: Payer: BC Managed Care – PPO | Admitting: Primary Care

## 2020-02-14 DIAGNOSIS — F325 Major depressive disorder, single episode, in full remission: Secondary | ICD-10-CM | POA: Diagnosis not present

## 2020-02-14 DIAGNOSIS — F411 Generalized anxiety disorder: Secondary | ICD-10-CM | POA: Diagnosis not present

## 2020-02-18 DIAGNOSIS — F411 Generalized anxiety disorder: Secondary | ICD-10-CM | POA: Diagnosis not present

## 2020-02-18 DIAGNOSIS — F325 Major depressive disorder, single episode, in full remission: Secondary | ICD-10-CM | POA: Diagnosis not present

## 2020-02-21 DIAGNOSIS — Z23 Encounter for immunization: Secondary | ICD-10-CM | POA: Diagnosis not present

## 2020-02-22 ENCOUNTER — Encounter: Payer: Self-pay | Admitting: Primary Care

## 2020-02-22 ENCOUNTER — Ambulatory Visit: Payer: BC Managed Care – PPO | Admitting: Primary Care

## 2020-02-22 ENCOUNTER — Other Ambulatory Visit: Payer: Self-pay

## 2020-02-22 VITALS — BP 124/80 | HR 78 | Temp 97.9°F | Ht 62.0 in | Wt 211.0 lb

## 2020-02-22 DIAGNOSIS — R7989 Other specified abnormal findings of blood chemistry: Secondary | ICD-10-CM | POA: Diagnosis not present

## 2020-02-22 DIAGNOSIS — Z0001 Encounter for general adult medical examination with abnormal findings: Secondary | ICD-10-CM | POA: Diagnosis not present

## 2020-02-22 DIAGNOSIS — M79661 Pain in right lower leg: Secondary | ICD-10-CM | POA: Diagnosis not present

## 2020-02-22 DIAGNOSIS — E669 Obesity, unspecified: Secondary | ICD-10-CM | POA: Diagnosis not present

## 2020-02-22 NOTE — Progress Notes (Signed)
Subjective:    Patient ID: Jamie Ortega, female    DOB: [birth year], 35 y.o.   MRN: [id number]  HPI  This visit occurred during the SARS-CoV-2 public health emergency.  Safety protocols were in place, including screening questions prior to the visit, additional usage of staff PPE, and extensive cleaning of exam room while observing appropriate contact time as indicated for disinfecting solutions.   Ms. Jamie Ortega is a 35 year old female who presents today for complete physical. She would also like to discuss leg pain.  1) Lower Extremity Pain: Acute to the right lower calf which began originally in December 2021, lasted for 2-3 hours then resolved with massage. This occurred while sitting in bed. She had a second occurrence of her symptoms that began on 02/08/20 while sitting in bed, felt like a squeeze/pressure from the inside of the leg out. This occurrence was more severe.  This lasted for about four hours and resolved with Tylenol or Ibuprofen and elevation.    She is not on birth control. She denies redness. She rode to the [place] for 2-3 hours on February 01, 2020, drove back on January 15th for the same amount of time. She is a non smoker.   Immunizations: -Tetanus: 2020 -Influenza: Declines  -Covid-19: Completed 3 vaccines  Diet: She endorses an improved diet.  Exercise: No regular exercise   Eye exam: Completes annually  Dental exam: No recent visit  Pap Smear: Due, she will schedule with GYN.   BP Readings from Last 3 Encounters:  02/22/20 124/80  01/29/20 118/80  03/06/19 (!) 102/58     Review of Systems  Constitutional: Negative for unexpected weight change.  HENT: Negative for rhinorrhea.   Eyes: Negative for visual disturbance.  Respiratory: Negative for cough and shortness of breath.   Cardiovascular: Negative for chest pain.  Gastrointestinal: Negative for constipation and diarrhea.  Genitourinary: Positive for menstrual problem. Negative for  difficulty urinating.       Irregular menstrual cycles  Musculoskeletal: Positive for arthralgias and myalgias.       Intermittent back pain, right lower extremity pain  Skin: Negative for color change and rash.  Allergic/Immunologic: Negative for environmental allergies.  Neurological: Positive for headaches. Negative for dizziness and numbness.  Psychiatric/Behavioral: The patient is not nervous/anxious.         Past Medical History:  Diagnosis Date  . Genital herpes      Social History   Socioeconomic History  . Marital status: Married    Spouse name: Not on file  . Number of children: 2  . Years of education: Not on file  . Highest education level: Not on file  Occupational History  . Not on file  Tobacco Use  . Smoking status: Never Smoker  . Smokeless tobacco: Never Used  Vaping Use  . Vaping Use: Never used  Substance and Sexual Activity  . Alcohol use: Not Currently    Comment: Socially  . Drug use: No  . Sexual activity: Not Currently    Partners: Male    Birth control/protection: None  Other Topics Concern  . Not on file  Social History Narrative   Married.   2 children.    Works at [employer] [place].    Enjoys kayaking, spending time outdoors.    Social Determinants of Health   Financial Resource Strain: Not on file  Food Insecurity: Not on file  Transportation Needs: Not on file  Physical Activity: Not on file  Stress: Not on  file  Social Connections: Not on file  Intimate Partner Violence: Not on file    Past Surgical History:  Procedure Laterality Date  . WISDOM TOOTH EXTRACTION      Family History  Problem Relation Age of Onset  . Cervical cancer Mother   . Endometriosis Mother   . Arthritis Mother   . Neuropathy Mother   . Hypertension Mother   . Anxiety disorder Mother   . Post-traumatic stress disorder Mother   . Hyperthyroidism Mother   . Hyperlipidemia Father   . Rheum arthritis Maternal Grandmother   . COPD Maternal  Grandmother   . Emphysema Maternal Grandmother   . Prostate cancer Maternal Grandfather   . Cancer Paternal Grandfather        Jaw  . Ovarian cancer Neg Hx   . Colon cancer Neg Hx   . Breast cancer Neg Hx     No Known Allergies  Current Outpatient Medications on File Prior to Visit  Medication Sig Dispense Refill  . fluticasone (FLONASE) 50 MCG/ACT nasal spray Place 2 sprays into both nostrils daily.    Marland Kitchen loratadine (CLARITIN) 10 MG tablet Take 10 mg by mouth daily.     No current facility-administered medications on file prior to visit.    BP 124/80   Pulse 78   Temp 97.9 F (36.6 C) (Oral)   Ht 5\' 2"  (1.575 m)   Wt 211 lb (95.7 kg)   SpO2 99%   BMI 38.59 kg/m    Objective:   Physical Exam Constitutional:      Appearance: She is well-nourished.  HENT:     Right Ear: Tympanic membrane and ear canal normal.     Left Ear: Tympanic membrane and ear canal normal.     Mouth/Throat:     Mouth: Oropharynx is clear and moist.  Eyes:     Extraocular Movements: EOM normal.     Pupils: Pupils are equal, round, and reactive to light.  Cardiovascular:     Rate and Rhythm: Normal rate and regular rhythm.     Comments: No right calf tenderness on exam. Calves equal bilaterally Pulmonary:     Effort: Pulmonary effort is normal.     Breath sounds: Normal breath sounds.  Abdominal:     General: Bowel sounds are normal.     Palpations: Abdomen is soft.     Tenderness: There is no abdominal tenderness.  Musculoskeletal:        General: Normal range of motion.     Cervical back: Neck supple.     Comments: No right calf tenderness on exam. Calves equal bilaterally  Skin:    General: Skin is warm and dry.  Neurological:     Mental Status: She is alert and oriented to person, place, and time.     Cranial Nerves: No cranial nerve deficit.     Deep Tendon Reflexes:     Reflex Scores:      Patellar reflexes are 2+ on the right side and 2+ on the left side. Psychiatric:         Mood and Affect: Mood and affect and mood normal.            Assessment & Plan:

## 2020-02-22 NOTE — Assessment & Plan Note (Signed)
Acute and occurring twice since December 2021. Second incidence occurred shortly after two back to back long car rides.  She is a non smoker, does not take OCP's or other birth control.   Exam today without obvious DVT. Stat venous ultrasound ordered and pending. Strict ED precautions provided.

## 2020-02-22 NOTE — Assessment & Plan Note (Signed)
TSH pending. 

## 2020-02-22 NOTE — Assessment & Plan Note (Signed)
Tetanus UTD, declines influenza vaccine. Pap smear due, follows with GYN and will schedule.  Discussed the importance of a healthy diet and regular exercise in order for weight loss, and to reduce the risk of any potential medical problems.   Exam today as noted. Labs pending.

## 2020-02-22 NOTE — Assessment & Plan Note (Signed)
Discussed the importance of a healthy diet and regular exercise in order for weight loss, and to reduce the risk of any potential medical problems.  A1C and Lipids pending.

## 2020-02-22 NOTE — Patient Instructions (Signed)
Stop by the lab prior to leaving today. I will notify you of your results once received.   Stop by the front desk and speak with either Ashtyn regarding your ultrasound.   Start exercising. You should be getting 150 minutes of moderate intensity exercise weekly.  It's important to improve your diet by reducing consumption of fast food, fried food, processed snack foods, sugary drinks. Increase consumption of fresh vegetables and fruits, whole grains, water.  Ensure you are drinking 64 ounces of water daily.  It was a pleasure to see you today!   Preventive Care 35-13 Years Old, Female Preventive care refers to lifestyle choices and visits with your health care provider that can promote health and wellness. This includes:  A yearly physical exam. This is also called an annual wellness visit.  Regular dental and eye exams.  Immunizations.  Screening for certain conditions.  Healthy lifestyle choices, such as: ? Eating a healthy diet. ? Getting regular exercise. ? Not using drugs or products that contain nicotine and tobacco. ? Limiting alcohol use. What can I expect for my preventive care visit? Physical exam Your health care provider may check your:  Height and weight. These may be used to calculate your BMI (body mass index). BMI is a measurement that tells if you are at a healthy weight.  Heart rate and blood pressure.  Body temperature.  Skin for abnormal spots. Counseling Your health care provider may ask you questions about your:  Past medical problems.  Family's medical history.  Alcohol, tobacco, and drug use.  Emotional well-being.  Home life and relationship well-being.  Sexual activity.  Diet, exercise, and sleep habits.  Work and work Statistician.  Access to firearms.  Method of birth control.  Menstrual cycle.  Pregnancy history. What immunizations do I need? Vaccines are usually given at various ages, according to a schedule. Your health  care provider will recommend vaccines for you based on your age, medical history, and lifestyle or other factors, such as travel or where you work.   What tests do I need? Blood tests  Lipid and cholesterol levels. These may be checked every 5 years starting at age 23.  Hepatitis C test.  Hepatitis B test. Screening  Diabetes screening. This is done by checking your blood sugar (glucose) after you have not eaten for a while (fasting).  STD (sexually transmitted disease) testing, if you are at risk.  BRCA-related cancer screening. This may be done if you have a family history of breast, ovarian, tubal, or peritoneal cancers.  Pelvic exam and Pap test. This may be done every 3 years starting at age 66. Starting at age 78, this may be done every 5 years if you have a Pap test in combination with an HPV test. Talk with your health care provider about your test results, treatment options, and if necessary, the need for more tests.   Follow these instructions at home: Eating and drinking  Eat a healthy diet that includes fresh fruits and vegetables, whole grains, lean protein, and low-fat dairy products.  Take vitamin and mineral supplements as recommended by your health care provider.  Do not drink alcohol if: ? Your health care provider tells you not to drink. ? You are pregnant, may be pregnant, or are planning to become pregnant.  If you drink alcohol: ? Limit how much you have to 0-1 drink a day. ? Be aware of how much alcohol is in your drink. In the [place] one drink equals one  12 oz bottle of beer (355 mL), one 5 oz glass of wine (148 mL), or one 1 oz glass of hard liquor (44 mL).   Lifestyle  Take daily care of your teeth and gums. Brush your teeth every morning and night with fluoride toothpaste. Floss one time each day.  Stay active. Exercise for at least 30 minutes 5 or more days each week.  Do not use any products that contain nicotine or tobacco, such as cigarettes,  e-cigarettes, and chewing tobacco. If you need help quitting, ask your health care provider.  Do not use drugs.  If you are sexually active, practice safe sex. Use a condom or other form of protection to prevent STIs (sexually transmitted infections).  If you do not wish to become pregnant, use a form of birth control. If you plan to become pregnant, see your health care provider for a prepregnancy visit.  Find healthy ways to cope with stress, such as: ? Meditation, yoga, or listening to music. ? Journaling. ? Talking to a trusted person. ? Spending time with friends and family. Safety  Always wear your seat belt while driving or riding in a vehicle.  Do not drive: ? If you have been drinking alcohol. Do not ride with someone who has been drinking. ? When you are tired or distracted. ? While texting.  Wear a helmet and other protective equipment during sports activities.  If you have firearms in your house, make sure you follow all gun safety procedures.  Seek help if you have been physically or sexually abused. What's next?  Go to your health care provider once a year for an annual wellness visit.  Ask your health care provider how often you should have your eyes and teeth checked.  Stay up to date on all vaccines. This information is not intended to replace advice given to you by your health care provider. Make sure you discuss any questions you have with your health care provider. Document Revised: 09/02/2019 Document Reviewed: 09/15/2017 Elsevier Patient Education  2021 [employer].

## 2020-02-23 LAB — CBC
HCT: 39.6 % (ref 35.0–45.0)
Hemoglobin: 13.6 g/dL (ref 11.7–15.5)
MCH: 31.1 pg (ref 27.0–33.0)
MCHC: 34.3 g/dL (ref 32.0–36.0)
MCV: 90.6 fL (ref 80.0–100.0)
MPV: 11.1 fL (ref 7.5–12.5)
Platelets: 295 10*3/uL (ref 140–400)
RBC: 4.37 10*6/uL (ref 3.80–5.10)
RDW: 13 % (ref 11.0–15.0)
WBC: 7.3 10*3/uL (ref 3.8–10.8)

## 2020-02-23 LAB — COMPREHENSIVE METABOLIC PANEL
AG Ratio: 1.6 (calc) (ref 1.0–2.5)
ALT: 24 U/L (ref 6–29)
AST: 18 U/L (ref 10–30)
Albumin: 4.3 g/dL (ref 3.6–5.1)
Alkaline phosphatase (APISO): 65 U/L (ref 31–125)
BUN: 13 mg/dL (ref 7–25)
CO2: 26 mmol/L (ref 20–32)
Calcium: 9.7 mg/dL (ref 8.6–10.2)
Chloride: 102 mmol/L (ref 98–110)
Creat: 0.97 mg/dL (ref 0.50–1.10)
Globulin: 2.7 g/dL (calc) (ref 1.9–3.7)
Glucose, Bld: 81 mg/dL (ref 65–99)
Potassium: 4.2 mmol/L (ref 3.5–5.3)
Sodium: 137 mmol/L (ref 135–146)
Total Bilirubin: 0.5 mg/dL (ref 0.2–1.2)
Total Protein: 7 g/dL (ref 6.1–8.1)

## 2020-02-23 LAB — LIPID PANEL
Cholesterol: 165 mg/dL (ref <200)
HDL: 41 mg/dL — ABNORMAL LOW (ref >=50)
LDL Cholesterol (Calc): 101 mg/dL (calc) — ABNORMAL HIGH
Non-HDL Cholesterol (Calc): 124 mg/dL (calc) (ref <130)
Total CHOL/HDL Ratio: 4 (calc) (ref <5.0)
Triglycerides: 126 mg/dL (ref <150)

## 2020-02-23 LAB — HEMOGLOBIN A1C
Hgb A1c MFr Bld: 5.5 % of total Hgb (ref <5.7)
Mean Plasma Glucose: 111 mg/dL
eAG (mmol/L): 6.2 mmol/L

## 2020-02-23 LAB — TSH: TSH: 0.38 mIU/L — ABNORMAL LOW

## 2020-02-25 ENCOUNTER — Ambulatory Visit
Admission: RE | Admit: 2020-02-25 | Discharge: 2020-02-25 | Disposition: A | Discharge status: Home / Self Care | Payer: BC Managed Care – PPO | Source: Ambulatory Visit | Attending: Primary Care | Admitting: Primary Care

## 2020-02-25 ENCOUNTER — Other Ambulatory Visit: Payer: Self-pay | Admitting: Primary Care

## 2020-02-25 DIAGNOSIS — R7989 Other specified abnormal findings of blood chemistry: Secondary | ICD-10-CM

## 2020-02-25 DIAGNOSIS — F411 Generalized anxiety disorder: Secondary | ICD-10-CM | POA: Diagnosis not present

## 2020-02-25 DIAGNOSIS — M79661 Pain in right lower leg: Secondary | ICD-10-CM

## 2020-02-26 ENCOUNTER — Other Ambulatory Visit (INDEPENDENT_AMBULATORY_CARE_PROVIDER_SITE_OTHER): Payer: BC Managed Care – PPO

## 2020-02-26 ENCOUNTER — Other Ambulatory Visit: Payer: Self-pay

## 2020-02-26 DIAGNOSIS — R7989 Other specified abnormal findings of blood chemistry: Secondary | ICD-10-CM | POA: Diagnosis not present

## 2020-02-27 LAB — T4, FREE: Free T4: 0.93 ng/dL (ref 0.60–1.60)

## 2020-02-27 LAB — TSH: TSH: 0.38 u[IU]/mL (ref 0.35–4.50)

## 2020-03-06 ENCOUNTER — Other Ambulatory Visit: Payer: Self-pay

## 2020-03-06 ENCOUNTER — Encounter: Payer: Self-pay | Admitting: Endocrinology

## 2020-03-06 ENCOUNTER — Telehealth (INDEPENDENT_AMBULATORY_CARE_PROVIDER_SITE_OTHER): Payer: BC Managed Care – PPO | Admitting: Endocrinology

## 2020-03-06 VITALS — Ht 62.0 in | Wt 204.0 lb

## 2020-03-06 DIAGNOSIS — R7989 Other specified abnormal findings of blood chemistry: Secondary | ICD-10-CM

## 2020-03-06 NOTE — Progress Notes (Signed)
Subjective:    Patient ID: Jamie Ortega, female    DOB: 01-22-1985, 35 y.o.   MRN: [id number]  HPI telehealth visit today via video visit.  Alternatives to telehealth are presented to this patient, and the patient agrees to the telehealth visit.   Pt is advised of the cost of the visit, and agrees to this, also.   Patient is in her car, and I am at home.   Persons attending the telehealth visit: the patient and I Pt returns for f/u of hyperthyroidism (dx'ed 2019; she has never been on therapy for this; she has never had thyroid imaging).  She did not take methimazole.  She is not considering another pregnancy now.  She reports anxiety and tremor.  She has lost 7 lbs recently.   Past Medical History:  Diagnosis Date  . Back pain affecting pregnancy in third trimester 02/10/2018  . Genital herpes   . Labor and delivery, indication for care 05/02/2018    Past Surgical History:  Procedure Laterality Date  . WISDOM TOOTH EXTRACTION      Social History   Socioeconomic History  . Marital status: Married    Spouse name: Not on file  . Number of children: 2  . Years of education: Not on file  . Highest education level: Not on file  Occupational History  . Not on file  Tobacco Use  . Smoking status: Never Smoker  . Smokeless tobacco: Never Used  Vaping Use  . Vaping Use: Never used  Substance and Sexual Activity  . Alcohol use: Not Currently    Comment: Socially  . Drug use: No  . Sexual activity: Not Currently    Partners: Male    Birth control/protection: None  Other Topics Concern  . Not on file  Social History Narrative   Married.   2 children.    Works at [employer] [place].    Enjoys kayaking, spending time outdoors.    Social Determinants of Health   Financial Resource Strain: Not on file  Food Insecurity: Not on file  Transportation Needs: Not on file  Physical Activity: Not on file  Stress: Not on file  Social Connections: Not on file  Intimate  Partner Violence: Not on file    Current Outpatient Medications on File Prior to Visit  Medication Sig Dispense Refill  . fluticasone (FLONASE) 50 MCG/ACT nasal spray Place 2 sprays into both nostrils daily.     No current facility-administered medications on file prior to visit.    No Known Allergies  Family History  Problem Relation Age of Onset  . Cervical cancer Mother   . Endometriosis Mother   . Arthritis Mother   . Neuropathy Mother   . Hypertension Mother   . Anxiety disorder Mother   . Post-traumatic stress disorder Mother   . Hyperthyroidism Mother   . Hyperlipidemia Father   . Rheum arthritis Maternal Grandmother   . COPD Maternal Grandmother   . Emphysema Maternal Grandmother   . Prostate cancer Maternal Grandfather   . Cancer Paternal Grandfather        Jaw  . Ovarian cancer Neg Hx   . Colon cancer Neg Hx   . Breast cancer Neg Hx     Ht 5\' 2"  (1.575 m)   Wt 204 lb (92.5 kg)   BMI 37.31 kg/m      Objective:   Physical Exam   Lab Results  Component Value Date   TSH 0.38 02/26/2020   T4TOTAL  16.9 (H) 10/24/2017      Assessment & Plan:  Hyperthyroidism, stable off medication.   Patient Instructions  No medication is needed for the thyroid now.  However, you have a high risk of abnormal thyroid function in the future.  Please come back for a follow-up appointment in 3 months.

## 2020-03-06 NOTE — Patient Instructions (Addendum)
No medication is needed for the thyroid now.  However, you have a high risk of abnormal thyroid function in the future.  Please come back for a follow-up appointment in 3 months.

## 2020-03-07 ENCOUNTER — Ambulatory Visit: Payer: BC Managed Care – PPO | Admitting: Endocrinology

## 2020-06-24 NOTE — Telephone Encounter (Signed)
Mrs. leopard called in returning [place] phone call  1) no she has not seen them 2) super and regular , and yes she is bleeding through them, 1 super 2 regular this mroning 3) feels like the normal period cramp 4) blood is bright red, not sure of clots shes been using the tampons , but havent seen any.

## 2020-07-04 DIAGNOSIS — F411 Generalized anxiety disorder: Secondary | ICD-10-CM | POA: Diagnosis not present

## 2020-07-22 DIAGNOSIS — F411 Generalized anxiety disorder: Secondary | ICD-10-CM | POA: Diagnosis not present

## 2020-07-29 DIAGNOSIS — F411 Generalized anxiety disorder: Secondary | ICD-10-CM | POA: Diagnosis not present

## 2021-02-10 DIAGNOSIS — R051 Acute cough: Secondary | ICD-10-CM | POA: Diagnosis not present

## 2021-02-10 DIAGNOSIS — J40 Bronchitis, not specified as acute or chronic: Secondary | ICD-10-CM | POA: Diagnosis not present

## 2021-02-21 DIAGNOSIS — Z20828 Contact with and (suspected) exposure to other viral communicable diseases: Secondary | ICD-10-CM | POA: Diagnosis not present

## 2021-02-21 DIAGNOSIS — U071 COVID-19: Secondary | ICD-10-CM | POA: Diagnosis not present

## 2021-07-17 DIAGNOSIS — M25562 Pain in left knee: Secondary | ICD-10-CM | POA: Diagnosis not present

## 2021-07-17 DIAGNOSIS — M25561 Pain in right knee: Secondary | ICD-10-CM | POA: Diagnosis not present

## 2021-08-10 ENCOUNTER — Ambulatory Visit: Payer: BC Managed Care – PPO | Admitting: Family

## 2021-08-10 ENCOUNTER — Encounter: Payer: Self-pay | Admitting: Family

## 2021-08-10 VITALS — BP 110/76 | HR 77 | Temp 98.6°F | Resp 16 | Ht 62.0 in | Wt 218.0 lb

## 2021-08-10 DIAGNOSIS — N6321 Unspecified lump in the left breast, upper outer quadrant: Secondary | ICD-10-CM | POA: Insufficient documentation

## 2021-08-10 DIAGNOSIS — N611 Abscess of the breast and nipple: Secondary | ICD-10-CM

## 2021-08-10 HISTORY — DX: Abscess of the breast and nipple: N61.1

## 2021-08-10 HISTORY — DX: Unspecified lump in the left breast, upper outer quadrant: N63.21

## 2021-08-10 MED ORDER — AMOXICILLIN-POT CLAVULANATE 875-125 MG PO TABS
1.0000 | ORAL_TABLET | Freq: Two times a day (BID) | ORAL | 0 refills | Status: DC
Start: 2021-08-10 — End: 2022-03-03

## 2021-08-10 NOTE — Assessment & Plan Note (Signed)
rx augmentin 875/125 mg po bid x 10 days Monitor for worsening s/s infection

## 2021-08-10 NOTE — Assessment & Plan Note (Signed)
Ordering left breast dx mammogram and left breast u/s  Pending results Suspected left breast abscess

## 2021-08-10 NOTE — Patient Instructions (Signed)
You have an appointment scheduled for: '[]'$   2D Mammogram  '[x]'$   3D Mammogram (left diagnostic mammogram and left breast ultrasound)  '[]'$   Bone Density     Your appointment will at the following location Please call to schedule  '[]'$   [facility]  [place] [address]  [phone]  '[]'$   [place] [facility])   [address] [place], [place] [address]  [phone]  '[x]'$   [facility]      [address] [place], [place]         '[]'$   [facility]  [place], [place]  '[]'$  [facility]   [address], [place] [address]  '[]'$  [address]  [place] [address] [place], [place] [address] [phone]    Make sure to wear two piece  clothing  No lotions powders or deodorants the day of the appointment Make sure to bring picture ID and insurance card.  Bring list of medications you are currently taking including any supplements.

## 2021-08-10 NOTE — Progress Notes (Signed)
Established Patient Office Visit  Subjective:  Patient ID: Jamie Ortega, female    DOB: [birth year]  Age: 36 y.o. MRN: [id number]  CC:  Chief Complaint  Patient presents with   Breast Pain    Left breast lines going though it and painful    HPI Jamie Ortega is here today with concerns.   One week ago noticed red rash that was itching around her left breast. Now with pain and lumpy around the nipple as well. Feels like her breast is engorged from when she used to pump. She also is seeing some indentations in her breast on the lower area of the left breast. No nipple discharge.   No known breast cancer in the family that she knows of.   Past Medical History:  Diagnosis Date   Back pain affecting pregnancy in third trimester 02/10/2018   Genital herpes    Labor and delivery, indication for care 05/02/2018    Past Surgical History:  Procedure Laterality Date   WISDOM TOOTH EXTRACTION      Family History  Problem Relation Age of Onset   Cervical cancer Mother    Endometriosis Mother    Arthritis Mother    Neuropathy Mother    Hypertension Mother    Anxiety disorder Mother    Post-traumatic stress disorder Mother    Hyperthyroidism Mother    Hyperlipidemia Father    Rheum arthritis Maternal Grandmother    COPD Maternal Grandmother    Emphysema Maternal Grandmother    Prostate cancer Maternal Grandfather    Cancer Paternal Grandfather        Jaw   Ovarian cancer Neg Hx    Colon cancer Neg Hx    Breast cancer Neg Hx     Social History   Socioeconomic History   Marital status: Married    Spouse name: Not on file   Number of children: 2   Years of education: Not on file   Highest education level: Not on file  Occupational History   Not on file  Tobacco Use   Smoking status: Never   Smokeless tobacco: Never  Vaping Use   Vaping Use: Never used  Substance and Sexual Activity   Alcohol use: Not Currently    Comment: Socially   Drug use: No    Sexual activity: Not Currently    Partners: Male    Birth control/protection: None  Other Topics Concern   Not on file  Social History Narrative   Married.   2 children.    Works at [employer] [place].    Enjoys kayaking, spending time outdoors.    Social Determinants of Health   Financial Resource Strain: Not on file  Food Insecurity: Not on file  Transportation Needs: Not on file  Physical Activity: Not on file  Stress: Not on file  Social Connections: Not on file  Intimate Partner Violence: Not on file    Outpatient Medications Prior to Visit  Medication Sig Dispense Refill   fluticasone (FLONASE) 50 MCG/ACT nasal spray Place 2 sprays into both nostrils daily.     meloxicam (MOBIC) 15 MG tablet Take 15 mg by mouth 3 (three) times daily.     No facility-administered medications prior to visit.    No Known Allergies      Objective:    Physical Exam Constitutional:      Appearance: She is obese.  Pulmonary:     Effort: Pulmonary effort is normal.  Chest:  Chest wall: Mass and tenderness (left breast mid to upper outer) present.  Breasts:    Right: No inverted nipple or nipple discharge.     Left: No inverted nipple, nipple discharge or skin change.    Neurological:     Mental Status: She is alert.     BP 110/76   Pulse 77   Temp 98.6 F (37 C)   Resp 16   Ht '5\' 2"'$  (1.575 m)   Wt 218 lb (98.9 kg)   LMP 07/15/2021 (Approximate)   SpO2 98%   Breastfeeding No   BMI 39.87 kg/m  Wt Readings from Last 3 Encounters:  08/10/21 218 lb (98.9 kg)  03/06/20 204 lb (92.5 kg)  02/22/20 211 lb (95.7 kg)     Health Maintenance Due  Topic Date Due   Hepatitis C Screening  Never done   PAP SMEAR-Modifier  08/19/2019   COVID-19 Vaccine (4 - Pfizer series) 04/17/2020    There are no preventive care reminders to display for this patient.  Lab Results  Component Value Date   TSH 0.38 02/26/2020   Lab Results  Component Value Date   WBC 7.3  02/22/2020   HGB 13.6 02/22/2020   HCT 39.6 02/22/2020   MCV 90.6 02/22/2020   PLT 295 02/22/2020   Lab Results  Component Value Date   NA 137 02/22/2020   K 4.2 02/22/2020   CO2 26 02/22/2020   GLUCOSE 81 02/22/2020   BUN 13 02/22/2020   CREATININE 0.97 02/22/2020   BILITOT 0.5 02/22/2020   AST 18 02/22/2020   ALT 24 02/22/2020   PROT 7.0 02/22/2020   CALCIUM 9.7 02/22/2020   ANIONGAP 10 09/16/2017   Lab Results  Component Value Date   HGBA1C 5.5 02/22/2020      Assessment & Plan:   Problem List Items Addressed This Visit       Other   Mass of upper outer quadrant of left breast - Primary    Ordering left breast dx mammogram and left breast u/s  Pending results Suspected left breast abscess      Left breast abscess    rx augmentin 875/125 mg po bid x 10 days Monitor for worsening s/s infection       Relevant Medications   amoxicillin-clavulanate (AUGMENTIN) 875-125 MG tablet    Meds ordered this encounter  Medications   amoxicillin-clavulanate (AUGMENTIN) 875-125 MG tablet    Sig: Take 1 tablet by mouth 2 (two) times daily.    Dispense:  20 tablet    Refill:  0    Order Specific Question:   Supervising Provider    Answer:   BEDSOLE, AMY E [[id number]]    Follow-up: No follow-ups on file.    Eugenia Pancoast, FNP

## 2022-03-03 ENCOUNTER — Inpatient Hospital Stay (HOSPITAL_COMMUNITY)
Admission: EM | Admit: 2022-03-03 | Discharge: 2022-03-05 | DRG: 287 | Disposition: A | Discharge status: Home / Self Care | Payer: BC Managed Care – PPO | Attending: Cardiovascular Disease | Admitting: Cardiovascular Disease

## 2022-03-03 ENCOUNTER — Encounter (HOSPITAL_COMMUNITY)
Admission: EM | Disposition: A | Discharge status: Home / Self Care | Payer: Self-pay | Source: Home / Self Care | Attending: Cardiovascular Disease

## 2022-03-03 ENCOUNTER — Other Ambulatory Visit: Payer: Self-pay

## 2022-03-03 ENCOUNTER — Emergency Department (HOSPITAL_COMMUNITY): Payer: BC Managed Care – PPO

## 2022-03-03 DIAGNOSIS — I319 Disease of pericardium, unspecified: Secondary | ICD-10-CM | POA: Insufficient documentation

## 2022-03-03 DIAGNOSIS — I214 Non-ST elevation (NSTEMI) myocardial infarction: Principal | ICD-10-CM

## 2022-03-03 DIAGNOSIS — Z8049 Family history of malignant neoplasm of other genital organs: Secondary | ICD-10-CM | POA: Diagnosis not present

## 2022-03-03 DIAGNOSIS — Z8261 Family history of arthritis: Secondary | ICD-10-CM

## 2022-03-03 DIAGNOSIS — Z818 Family history of other mental and behavioral disorders: Secondary | ICD-10-CM

## 2022-03-03 DIAGNOSIS — Z79899 Other long term (current) drug therapy: Secondary | ICD-10-CM | POA: Diagnosis not present

## 2022-03-03 DIAGNOSIS — I309 Acute pericarditis, unspecified: Principal | ICD-10-CM | POA: Diagnosis present

## 2022-03-03 DIAGNOSIS — R0789 Other chest pain: Secondary | ICD-10-CM | POA: Diagnosis not present

## 2022-03-03 DIAGNOSIS — I700 Atherosclerosis of aorta: Secondary | ICD-10-CM | POA: Diagnosis not present

## 2022-03-03 DIAGNOSIS — R7989 Other specified abnormal findings of blood chemistry: Secondary | ICD-10-CM | POA: Diagnosis present

## 2022-03-03 DIAGNOSIS — I081 Rheumatic disorders of both mitral and tricuspid valves: Secondary | ICD-10-CM | POA: Diagnosis not present

## 2022-03-03 DIAGNOSIS — Z791 Long term (current) use of non-steroidal anti-inflammatories (NSAID): Secondary | ICD-10-CM | POA: Diagnosis not present

## 2022-03-03 DIAGNOSIS — N852 Hypertrophy of uterus: Secondary | ICD-10-CM | POA: Diagnosis not present

## 2022-03-03 DIAGNOSIS — I472 Ventricular tachycardia, unspecified: Secondary | ICD-10-CM | POA: Diagnosis present

## 2022-03-03 DIAGNOSIS — R079 Chest pain, unspecified: Secondary | ICD-10-CM | POA: Diagnosis not present

## 2022-03-03 DIAGNOSIS — R519 Headache, unspecified: Secondary | ICD-10-CM | POA: Diagnosis present

## 2022-03-03 DIAGNOSIS — I252 Old myocardial infarction: Secondary | ICD-10-CM | POA: Insufficient documentation

## 2022-03-03 DIAGNOSIS — Z8249 Family history of ischemic heart disease and other diseases of the circulatory system: Secondary | ICD-10-CM

## 2022-03-03 DIAGNOSIS — E041 Nontoxic single thyroid nodule: Secondary | ICD-10-CM | POA: Diagnosis not present

## 2022-03-03 DIAGNOSIS — Z825 Family history of asthma and other chronic lower respiratory diseases: Secondary | ICD-10-CM

## 2022-03-03 DIAGNOSIS — Q283 Other malformations of cerebral vessels: Secondary | ICD-10-CM | POA: Diagnosis not present

## 2022-03-03 DIAGNOSIS — E876 Hypokalemia: Secondary | ICD-10-CM | POA: Diagnosis not present

## 2022-03-03 DIAGNOSIS — K7689 Other specified diseases of liver: Secondary | ICD-10-CM | POA: Diagnosis not present

## 2022-03-03 DIAGNOSIS — I4729 Other ventricular tachycardia: Secondary | ICD-10-CM | POA: Insufficient documentation

## 2022-03-03 HISTORY — PX: LEFT HEART CATH AND CORONARY ANGIOGRAPHY: CATH118249

## 2022-03-03 LAB — BASIC METABOLIC PANEL
Anion gap: 7 (ref 5–15)
BUN: 11 mg/dL (ref 6–20)
CO2: 24 mmol/L (ref 22–32)
Calcium: 8.3 mg/dL — ABNORMAL LOW (ref 8.9–10.3)
Chloride: 106 mmol/L (ref 98–111)
Creatinine, Ser: 1 mg/dL (ref 0.44–1.00)
GFR, Estimated: >60 mL/min (ref >60)
Glucose, Bld: 137 mg/dL — ABNORMAL HIGH (ref 70–99)
Potassium: 3.2 mmol/L — ABNORMAL LOW (ref 3.5–5.1)
Sodium: 137 mmol/L (ref 135–145)

## 2022-03-03 LAB — CBC
HCT: 41.6 % (ref 36.0–46.0)
HCT: 37.3 % (ref 36.0–46.0)
Hemoglobin: 13.4 g/dL (ref 12.0–15.0)
Hemoglobin: 12.5 g/dL (ref 12.0–15.0)
MCH: 31.2 pg (ref 26.0–34.0)
MCH: 30.9 pg (ref 26.0–34.0)
MCHC: 32.2 g/dL (ref 30.0–36.0)
MCHC: 33.5 g/dL (ref 30.0–36.0)
MCV: 96.7 fL (ref 80.0–100.0)
MCV: 92.3 fL (ref 80.0–100.0)
Platelets: 279 10*3/uL (ref 150–400)
Platelets: 269 10*3/uL (ref 150–400)
RBC: 4.3 MIL/uL (ref 3.87–5.11)
RBC: 4.04 MIL/uL (ref 3.87–5.11)
RDW: 14.5 % (ref 11.5–15.5)
RDW: 14.3 % (ref 11.5–15.5)
WBC: 7.8 10*3/uL (ref 4.0–10.5)
WBC: 8 10*3/uL (ref 4.0–10.5)
nRBC: 0 % (ref 0.0–0.2)
nRBC: 0 % (ref 0.0–0.2)

## 2022-03-03 LAB — MAGNESIUM: Magnesium: 2.2 mg/dL (ref 1.7–2.4)

## 2022-03-03 LAB — COMPREHENSIVE METABOLIC PANEL
ALT: 23 U/L (ref 0–44)
AST: 29 U/L (ref 15–41)
Albumin: 3.6 g/dL (ref 3.5–5.0)
Alkaline Phosphatase: 59 U/L (ref 38–126)
Anion gap: 6 (ref 5–15)
BUN: 14 mg/dL (ref 6–20)
CO2: 22 mmol/L (ref 22–32)
Calcium: 8.7 mg/dL — ABNORMAL LOW (ref 8.9–10.3)
Chloride: 109 mmol/L (ref 98–111)
Creatinine, Ser: 0.98 mg/dL (ref 0.44–1.00)
GFR, Estimated: >60 mL/min (ref >60)
Glucose, Bld: 80 mg/dL (ref 70–99)
Potassium: 3.5 mmol/L (ref 3.5–5.1)
Sodium: 137 mmol/L (ref 135–145)
Total Bilirubin: 0.4 mg/dL (ref 0.3–1.2)
Total Protein: 6.6 g/dL (ref 6.5–8.1)

## 2022-03-03 LAB — I-STAT BETA HCG BLOOD, ED (MC, WL, AP ONLY): I-stat hCG, quantitative: <5 m[IU]/mL (ref <5)

## 2022-03-03 LAB — TROPONIN I (HIGH SENSITIVITY)
Troponin I (High Sensitivity): 1930 ng/L (ref <18)
Troponin I (High Sensitivity): 5169 ng/L (ref <18)

## 2022-03-03 LAB — CREATININE, SERUM
Creatinine, Ser: 1 mg/dL (ref 0.44–1.00)
GFR, Estimated: >60 mL/min (ref >60)

## 2022-03-03 LAB — TSH: TSH: 0.599 u[IU]/mL (ref 0.350–4.500)

## 2022-03-03 LAB — HCG, QUANTITATIVE, PREGNANCY: hCG, Beta Chain, Quant, S: 1 m[IU]/mL (ref <5)

## 2022-03-03 LAB — HIV ANTIBODY (ROUTINE TESTING W REFLEX): HIV Screen 4th Generation wRfx: NONREACTIVE

## 2022-03-03 SURGERY — LEFT HEART CATH AND CORONARY ANGIOGRAPHY
Anesthesia: LOCAL

## 2022-03-03 MED ORDER — VERAPAMIL HCL 2.5 MG/ML IV SOLN
INTRAVENOUS | Status: AC
  Filled 2022-03-03: qty 2

## 2022-03-03 MED ORDER — ASPIRIN 300 MG RE SUPP: 300.0000 mg | RECTAL | Status: DC

## 2022-03-03 MED ORDER — HEPARIN BOLUS VIA INFUSION
4000.0000 [IU] | Freq: Once | INTRAVENOUS | Status: AC
  Administered 2022-03-03: 4000 [IU] via INTRAVENOUS
  Filled 2022-03-03: qty 4000

## 2022-03-03 MED ORDER — IOHEXOL 350 MG/ML SOLN
100.0000 mL | Freq: Once | INTRAVENOUS | Status: AC | PRN
  Administered 2022-03-03: 100 mL via INTRAVENOUS

## 2022-03-03 MED ORDER — SODIUM CHLORIDE 0.9 % WEIGHT BASED INFUSION
1.0000 mL/kg/h | INTRAVENOUS | Status: AC
  Administered 2022-03-03: 1 mL/kg/h via INTRAVENOUS

## 2022-03-03 MED ORDER — HYDRALAZINE HCL 20 MG/ML IJ SOLN: 10.0000 mg | INTRAMUSCULAR | Status: AC | PRN

## 2022-03-03 MED ORDER — MIDAZOLAM HCL 2 MG/2ML IJ SOLN
INTRAMUSCULAR | Status: AC
  Filled 2022-03-03: qty 2

## 2022-03-03 MED ORDER — MIDAZOLAM HCL 2 MG/2ML IJ SOLN
INTRAMUSCULAR | Status: DC | PRN
  Administered 2022-03-03: 1 mg via INTRAVENOUS

## 2022-03-03 MED ORDER — SODIUM CHLORIDE 0.9 % WEIGHT BASED INFUSION: 1.0000 mL/kg/h | INTRAVENOUS | Status: DC

## 2022-03-03 MED ORDER — SODIUM CHLORIDE 0.9% FLUSH
3.0000 mL | Freq: Two times a day (BID) | INTRAVENOUS | Status: DC
  Administered 2022-03-04: 3 mL via INTRAVENOUS

## 2022-03-03 MED ORDER — METOPROLOL TARTRATE 25 MG PO TABS
25.0000 mg | ORAL_TABLET | Freq: Two times a day (BID) | ORAL | Status: DC
  Administered 2022-03-03 – 2022-03-05 (×4): 25 mg via ORAL
  Filled 2022-03-03 (×4): qty 1

## 2022-03-03 MED ORDER — ACETAMINOPHEN 500 MG PO TABS
1000.0000 mg | ORAL_TABLET | Freq: Once | ORAL | Status: AC
  Administered 2022-03-03: 1000 mg via ORAL
  Filled 2022-03-03: qty 2

## 2022-03-03 MED ORDER — ACETAMINOPHEN 325 MG PO TABS
650.0000 mg | ORAL_TABLET | ORAL | Status: DC | PRN
  Administered 2022-03-04: 650 mg via ORAL
  Filled 2022-03-03: qty 2

## 2022-03-03 MED ORDER — ONDANSETRON HCL 4 MG/2ML IJ SOLN: 4.0000 mg | Freq: Four times a day (QID) | INTRAMUSCULAR | Status: DC | PRN

## 2022-03-03 MED ORDER — POTASSIUM CHLORIDE CRYS ER 20 MEQ PO TBCR: 40.0000 meq | EXTENDED_RELEASE_TABLET | Freq: Once | ORAL | Status: DC

## 2022-03-03 MED ORDER — HEPARIN SODIUM (PORCINE) 1000 UNIT/ML IJ SOLN
INTRAMUSCULAR | Status: AC
  Filled 2022-03-03: qty 10

## 2022-03-03 MED ORDER — ASPIRIN 81 MG PO CHEW: 324.0000 mg | CHEWABLE_TABLET | ORAL | Status: DC

## 2022-03-03 MED ORDER — FENTANYL CITRATE (PF) 100 MCG/2ML IJ SOLN
INTRAMUSCULAR | Status: DC | PRN
  Administered 2022-03-03: 25 ug via INTRAVENOUS

## 2022-03-03 MED ORDER — HEPARIN SODIUM (PORCINE) 5000 UNIT/ML IJ SOLN
5000.0000 [IU] | Freq: Three times a day (TID) | INTRAMUSCULAR | Status: DC
  Administered 2022-03-03 – 2022-03-05 (×5): 5000 [IU] via SUBCUTANEOUS
  Filled 2022-03-03 (×5): qty 1

## 2022-03-03 MED ORDER — SODIUM CHLORIDE 0.9% FLUSH: 3.0000 mL | INTRAVENOUS | Status: DC | PRN

## 2022-03-03 MED ORDER — POTASSIUM CHLORIDE CRYS ER 20 MEQ PO TBCR
40.0000 meq | EXTENDED_RELEASE_TABLET | Freq: Once | ORAL | Status: AC
  Administered 2022-03-03: 40 meq via ORAL
  Filled 2022-03-03: qty 2

## 2022-03-03 MED ORDER — MORPHINE SULFATE (PF) 4 MG/ML IV SOLN
4.0000 mg | Freq: Once | INTRAVENOUS | Status: AC
  Administered 2022-03-03: 4 mg via INTRAVENOUS
  Filled 2022-03-03: qty 1

## 2022-03-03 MED ORDER — ACETAMINOPHEN 325 MG PO TABS: 650.0000 mg | ORAL_TABLET | ORAL | Status: DC | PRN

## 2022-03-03 MED ORDER — SODIUM CHLORIDE 0.9 % IV SOLN: 250.0000 mL | INTRAVENOUS | Status: DC | PRN

## 2022-03-03 MED ORDER — POTASSIUM CHLORIDE CRYS ER 20 MEQ PO TBCR
40.0000 meq | EXTENDED_RELEASE_TABLET | ORAL | Status: AC
  Administered 2022-03-03: 40 meq via ORAL
  Filled 2022-03-03: qty 2

## 2022-03-03 MED ORDER — LIDOCAINE HCL (PF) 1 % IJ SOLN
INTRAMUSCULAR | Status: AC
  Filled 2022-03-03: qty 30

## 2022-03-03 MED ORDER — NITROGLYCERIN 0.4 MG SL SUBL: 0.4000 mg | SUBLINGUAL_TABLET | SUBLINGUAL | Status: DC | PRN

## 2022-03-03 MED ORDER — HEPARIN (PORCINE) 25000 UT/250ML-% IV SOLN
850.0000 [IU]/h | INTRAVENOUS | Status: DC
  Administered 2022-03-03: 850 [IU]/h via INTRAVENOUS
  Filled 2022-03-03: qty 250

## 2022-03-03 MED ORDER — ASPIRIN 81 MG PO TBEC
81.0000 mg | DELAYED_RELEASE_TABLET | Freq: Every day | ORAL | Status: DC
  Administered 2022-03-04: 81 mg via ORAL
  Filled 2022-03-03: qty 1

## 2022-03-03 MED ORDER — LIDOCAINE HCL (PF) 1 % IJ SOLN
INTRAMUSCULAR | Status: DC | PRN
  Administered 2022-03-03: 2 mL

## 2022-03-03 MED ORDER — ASPIRIN 81 MG PO CHEW: 81.0000 mg | CHEWABLE_TABLET | ORAL | Status: DC

## 2022-03-03 MED ORDER — VERAPAMIL HCL 2.5 MG/ML IV SOLN
INTRAVENOUS | Status: DC | PRN
  Administered 2022-03-03: 10 mL via INTRA_ARTERIAL

## 2022-03-03 MED ORDER — SODIUM CHLORIDE 0.9% FLUSH
3.0000 mL | Freq: Two times a day (BID) | INTRAVENOUS | Status: DC
  Administered 2022-03-04 – 2022-03-05 (×4): 3 mL via INTRAVENOUS

## 2022-03-03 MED ORDER — FENTANYL CITRATE (PF) 100 MCG/2ML IJ SOLN
INTRAMUSCULAR | Status: AC
  Filled 2022-03-03: qty 2

## 2022-03-03 MED ORDER — ONDANSETRON HCL 4 MG/2ML IJ SOLN
4.0000 mg | Freq: Once | INTRAMUSCULAR | Status: AC
  Administered 2022-03-03: 4 mg via INTRAVENOUS
  Filled 2022-03-03: qty 2

## 2022-03-03 MED ORDER — IOHEXOL 350 MG/ML SOLN
INTRAVENOUS | Status: DC | PRN
  Administered 2022-03-03: 75 mL

## 2022-03-03 MED ORDER — ASPIRIN 81 MG PO CHEW
324.0000 mg | CHEWABLE_TABLET | Freq: Once | ORAL | Status: AC
  Administered 2022-03-03: 324 mg via ORAL
  Filled 2022-03-03: qty 4

## 2022-03-03 MED ORDER — HEPARIN SODIUM (PORCINE) 1000 UNIT/ML IJ SOLN
INTRAMUSCULAR | Status: DC | PRN
  Administered 2022-03-03: 4500 [IU] via INTRAVENOUS

## 2022-03-03 MED ORDER — SODIUM CHLORIDE 0.9 % WEIGHT BASED INFUSION: 3.0000 mL/kg/h | INTRAVENOUS | Status: DC

## 2022-03-03 SURGICAL SUPPLY — 12 items
CATH INFINITI 5 FR JL3.5 (CATHETERS) IMPLANT
CATH INFINITI JR4 5F (CATHETERS) IMPLANT
DEVICE RAD COMP TR BAND LRG (VASCULAR PRODUCTS) IMPLANT
GLIDESHEATH SLEND SS 6F .021 (SHEATH) IMPLANT
GUIDEWIRE INQWIRE 1.5J.035X260 (WIRE) IMPLANT
INQWIRE 1.5J .035X260CM (WIRE) ×1
KIT HEART LEFT (KITS) ×1 IMPLANT
PACK CARDIAC CATHETERIZATION (CUSTOM PROCEDURE TRAY) ×1 IMPLANT
SHEATH PROBE COVER 6X72 (BAG) IMPLANT
TRANSDUCER W/STOPCOCK (MISCELLANEOUS) ×1 IMPLANT
TUBING CIL FLEX 10 FLL-RA (TUBING) ×1 IMPLANT
WIRE HI TORQ VERSACORE-J 145CM (WIRE) IMPLANT

## 2022-03-03 NOTE — ED Provider Notes (Signed)
[facility] Provider Note   CSN: [id number] Arrival date & time: 03/03/22  [id number]     History  Chief Complaint  Patient presents with   Headache   Chest Pain    Jamie Ortega is a 37 y.o. female.  The history is provided by the patient and medical records. No language interpreter was used.  Headache Chest Pain Associated symptoms: headache      37 year old female presents today with complaints of headache and chest pain.  Patient report this morning she was doing fine and of her normal self.  She developed acute onset severe throbbing pulsating frontal headache approximately 3 hours ago with associated nausea, cold chills, and generalized weakness.  Headache spread towards her neck and chest lasting for approximately 15 minutes and did improved.  Headache now is waxing waning not improved with Tylenol.  No fever no vision changes diaphoresis exertional chest pain or shortness of breath.  No rash.  She does not normally have headaches.  Patient states "I have never had an aneurysm before but I worry about this headache".  Patient denies runny nose sneezing or coughing.  She does not have any significant cardiac history.  She does not smoke or drink alcohol excessively.  Home Medications Prior to Admission medications   Medication Sig Start Date End Date Taking? Authorizing Provider  amoxicillin-clavulanate (AUGMENTIN) 875-125 MG tablet Take 1 tablet by mouth 2 (two) times daily. 08/10/21   Eugenia Pancoast, FNP  fluticasone (FLONASE) 50 MCG/ACT nasal spray Place 2 sprays into both nostrils daily. 04/18/18   [provider]  meloxicam (MOBIC) 15 MG tablet Take 15 mg by mouth 3 (three) times daily. 07/17/21   [provider]      Allergies    Patient has no known allergies.    Review of Systems   Review of Systems  Cardiovascular:  Positive for chest pain.  Neurological:  Positive for headaches.  All other systems  reviewed and are negative.   Physical Exam Updated Vital Signs BP 120/76   Pulse 72   Temp 98.4 F (36.9 C) (Oral)   Resp 18   Ht 5' 2"$  (1.575 m)   Wt 89.8 kg   SpO2 96%   BMI 36.21 kg/m  Physical Exam Vitals and nursing note reviewed.  Constitutional:      General: She is not in acute distress.    Appearance: She is well-developed. She is obese.     Comments: Patient is laying in bed eyes close appears uncomfortable.  HENT:     Head: Normocephalic and atraumatic.  Eyes:     General: No visual field deficit.    Extraocular Movements: Extraocular movements intact.     Conjunctiva/sclera: Conjunctivae normal.     Pupils: Pupils are equal, round, and reactive to light.  Cardiovascular:     Rate and Rhythm: Normal rate and regular rhythm.     Heart sounds: Normal heart sounds.  Pulmonary:     Effort: Pulmonary effort is normal.  Abdominal:     Palpations: Abdomen is soft.  Musculoskeletal:     Cervical back: Normal range of motion and neck supple. No rigidity.     Comments: Global weakness with equal strength throughout  Skin:    Findings: No rash.  Neurological:     Mental Status: She is alert and oriented to person, place, and time.     GCS: GCS eye subscore is 4. GCS verbal subscore is 5. GCS  motor subscore is 6.     Cranial Nerves: No cranial nerve deficit, dysarthria or facial asymmetry.     Sensory: No sensory deficit.     Motor: Weakness present.  Psychiatric:        Mood and Affect: Mood normal.        Speech: Speech normal.     ED Results / Procedures / Treatments   Labs (all labs ordered are listed, but only abnormal results are displayed) Labs Reviewed  COMPREHENSIVE METABOLIC PANEL - Abnormal; Notable for the following components:      Result Value   Calcium 8.7 (*)    All other components within normal limits  TROPONIN I (HIGH SENSITIVITY) - Abnormal; Notable for the following components:   Troponin I (High Sensitivity) 1,930 (*)    All other  components within normal limits  TROPONIN I (HIGH SENSITIVITY) - Abnormal; Notable for the following components:   Troponin I (High Sensitivity) 5,169 (*)    All other components within normal limits  CBC  HCG, QUANTITATIVE, PREGNANCY  I-STAT BETA HCG BLOOD, ED ([facility], [facility], [facility] ONLY)    EKG EKG Interpretation  Date/Time:  Wednesday March 03 2022 09:01:34 EST Ventricular Rate:  75 PR Interval:  168 QRS Duration: 95 QT Interval:  378 QTC Calculation: 423 R Axis:   11 Text Interpretation: Sinus rhythm Ventricular premature complex Low voltage, precordial leads Confirmed by Tretha Sciara [phone]) on 03/03/2022 12:50:45 PM  Date: 03/03/2022  Rate: 75  Rhythm: normal sinus rhythm  QRS Axis: normal  Intervals: normal  ST/T Wave abnormalities: normal  Conduction Disutrbances: none  Narrative Interpretation:   Old EKG Reviewed: No significant changes noted    Radiology CT ANGIO HEAD NECK W WO CM  Result Date: 03/03/2022 CLINICAL DATA:  Vertebral artery dissection suspected r/o SAH EXAM: CT ANGIOGRAPHY HEAD AND NECK TECHNIQUE: Multidetector CT imaging of the head and neck was performed using the standard protocol during bolus administration of intravenous contrast. Multiplanar CT image reconstructions and MIPs were obtained to evaluate the vascular anatomy. Carotid stenosis measurements (when applicable) are obtained utilizing NASCET criteria, using the distal internal carotid diameter as the denominator. RADIATION DOSE REDUCTION: This exam was performed according to the departmental dose-optimization program which includes automated exposure control, adjustment of the mA and/or kV according to patient size and/or use of iterative reconstruction technique. CONTRAST:  189m OMNIPAQUE IOHEXOL 350 MG/ML SOLN COMPARISON:  None Available. FINDINGS: CT HEAD FINDINGS Brain: No evidence of acute infarction, hemorrhage, hydrocephalus, extra-axial collection or mass lesion/mass effect. Partially empty  and expanded sella. Vascular: See below. Skull: No acute fracture. Sinuses/Orbits: No acute finding. Review of the MIP images confirms the above findings CTA NECK FINDINGS Aortic arch: Patent without significant stenosis. Right carotid system: No evidence of dissection, stenosis (50% or greater), or occlusion. Left carotid system: No evidence of dissection, stenosis (50% or greater), or occlusion. Vertebral arteries: Right dominant. No evidence of dissection, stenosis (50% or greater), or occlusion. Skeleton: No acute fracture. Other neck: Subcentimeter right thyroid nodule which does not require further imaging follow-up. (ref: J Am Coll Radiol. 2015 Feb;12(2): 143-50). Upper chest: Visualized lung apices are clear. Review of the MIP images confirms the above findings CTA HEAD FINDINGS Anterior circulation: Hypoplastic right A1 ACA, likely congenital. Otherwise, bilateral intracranial ICAs, MCAs, and ACAs are patent without proximal hemodynamically significant stenosis. No aneurysm identified. Posterior circulation: Bilateral intradural vertebral arteries, basilar artery and bilateral posterior cerebral arteries are patent without proximal hemodynamically significant stenosis. Small vertebrobasilar  system with right fetal type PCA, anatomic variant. No aneurysm identified. Venous sinuses: Somewhat rounded filling defects within the distal right transverse sinus. Otherwise, the visualized dural venous sinuses are patent. Anatomic variants: Detailed above. Review of the MIP images confirms the above findings IMPRESSION: CT head: 1. No evidence of acute intracranial abnormality. 2. Partially empty and expanded sella, which is often a normal anatomic variant but can be associated with idiopathic intracranial hypertension (pseudotumor cerebri). CTA: 1. No large vessel occlusion, proximal hemodynamically significant stenosis, or evidence of dissection. 2. Somewhat rounded filling defects within the distal right  transverse sinus. These could potentially represent arachnoid granulations; however, dural venous sinus thrombosis is difficult to exclude. An MRI with and without contrast could further assess if clinically warranted. Electronically Signed   By: Margaretha Sheffield M.D.   On: 03/03/2022 13:12   CT Angio Chest/Abd/Pel for Dissection W and/or Wo Contrast  Result Date: 03/03/2022 CLINICAL DATA:  Concern for acute aortic dissection. EXAM: CT ANGIOGRAPHY CHEST, ABDOMEN AND PELVIS TECHNIQUE: Non-contrast CT of the chest was initially obtained. Multidetector CT imaging through the chest, abdomen and pelvis was performed using the standard protocol during bolus administration of intravenous contrast. Multiplanar reconstructed images and MIPs were obtained and reviewed to evaluate the vascular anatomy. RADIATION DOSE REDUCTION: This exam was performed according to the departmental dose-optimization program which includes automated exposure control, adjustment of the mA and/or kV according to patient size and/or use of iterative reconstruction technique. CONTRAST:  143m OMNIPAQUE IOHEXOL 350 MG/ML SOLN COMPARISON:  Ultrasound March 06, 2019 and July 08, 2017. FINDINGS: CTA CHEST FINDINGS Cardiovascular: Noncontrast enhanced portion of the examination demonstrates minimal aortic atherosclerosis without intramural hematoma. Preferential opacification of the thoracic aorta post contrast injection without evidence of thoracic aortic aneurysm or dissection. Three-vessel arch. No central pulmonary embolus on this nondedicated study. Normal heart size. No significant pericardial effusion/thickening. Mediastinum/Nodes: No suspicious thyroid nodule. No pathologically enlarged mediastinal, hilar or axillary lymph nodes. The esophagus is grossly unremarkable. Lungs/Pleura: Hypoventilatory change in the dependent lungs. No suspicious pulmonary nodules or masses. No pleural effusion. No pneumothorax. Musculoskeletal: No chest wall  abnormality. No acute or significant osseous findings. Review of the MIP images confirms the above findings. CTA ABDOMEN AND PELVIS FINDINGS VASCULAR Aorta: Normal caliber aorta without aneurysm, dissection, vasculitis or significant stenosis. Celiac: Patent without evidence of aneurysm, dissection, vasculitis or significant stenosis. SMA: Patent without evidence of aneurysm, dissection, vasculitis or significant stenosis. Renals: Both renal arteries are patent without evidence of aneurysm, dissection, vasculitis, fibromuscular dysplasia or significant stenosis. IMA: Patent without evidence of aneurysm, dissection, vasculitis or significant stenosis. Inflow: Patent without evidence of aneurysm, dissection, vasculitis or significant stenosis. Veins: No obvious venous abnormality within the limitations of this arterial phase study. Review of the MIP images confirms the above findings. NON-VASCULAR Hepatobiliary: Cyst in the right lobe of the liver. Gallbladder is unremarkable. No biliary ductal dilation. Pancreas: No pancreatic ductal dilation or evidence of acute inflammation. Spleen: No splenomegaly. Adrenals/Urinary Tract: Bilateral adrenal glands appear normal. Bilateral hypodense renal lesions are technically too small to accurately characterize but statistically likely reflect cysts and considered benign requiring no independent imaging follow-up. Urinary bladder is unremarkable for degree of distension. Stomach/Bowel: Stomach is unremarkable for degree of distension. No pathologic dilation of small or large bowel. No evidence of acute bowel inflammation. Normal appendix and terminal ileum. Lymphatic: No pathologically enlarged abdominal or pelvic lymph nodes. Reproductive: Enlarged uterus with lobular uterine contour. No suspicious adnexal mass. Other: No significant abdominopelvic  free fluid. Musculoskeletal: No acute osseous abnormality. Review of the MIP images confirms the above findings. IMPRESSION: 1. No  evidence of aortic aneurysm or dissection. 2. No acute abnormality in the chest, abdomen, or pelvis. 3. Enlarged uterus with lobular uterine contour may reflect leiomyomas. Electronically Signed   By: Dahlia Bailiff M.D.   On: 03/03/2022 12:57   DG Chest Port 1 View  Result Date: 03/03/2022 CLINICAL DATA:  Chest pain. EXAM: PORTABLE CHEST 1 VIEW COMPARISON:  None Available. FINDINGS: Low lung volumes. No consolidation. No visible pleural effusions or pneumothorax. Cardiomediastinal silhouette is within normal limits for technique. IMPRESSION: No active disease. Electronically Signed   By: Margaretha Sheffield M.D.   On: 03/03/2022 09:47    Procedures .Critical Care  Performed by: Domenic Moras, PA-C Authorized by: Domenic Moras, PA-C   Critical care provider statement:    Critical care time (minutes):  45   Critical care was time spent personally by me on the following activities:  Development of treatment plan with patient or surrogate, discussions with consultants, evaluation of patient's response to treatment, examination of patient, ordering and review of laboratory studies, ordering and review of radiographic studies, ordering and performing treatments and interventions, pulse oximetry, re-evaluation of patient's condition and review of old charts     Medications Ordered in ED Medications  morphine (PF) 4 MG/ML injection 4 mg (4 mg Intravenous Given 03/03/22 0928)  ondansetron (ZOFRAN) injection 4 mg (4 mg Intravenous Given 03/03/22 [id number])    ED Course/ Medical Decision Making/ A&P Clinical Course as of 03/03/22 1332  Wed Mar 03, 2022  1112 Evaluated at bedside.  This is a 37 year old female presenting with a chief complaint of sudden onset headache this morning at approximately 6 AM. CTA immediately ordered per PA.  It was accompanied by an episode of chest pain as well so troponin was ordered.  Critical result of troponin 1900 identified.  EKG reviewed without ischemic changes.  Patient has  had complete resolution of all symptoms outside of some mild residual headache. I do not believe she is having a coronary event at this time based on lack of any risk factors including hypertension, hyperlipidemia, diabetes, family history of heart disease. She is asymptomatic which I believe is less consistent with an acute coronary syndrome as well. Given multisystem involvement with headache and chest pain and elevated troponin, dissection is high on the differential.  Code medical activated.  Considered giving aspirin for potential ACS, however if this is a dissection, this could cause serious harm to the patient.  Given pretest probability being more likely for dissection rather than ACS at this time.  Will wait for CTA. [CC]  1329 DG Chest Port 1 View [CC]    Clinical Course User Index [CC] Tretha Sciara, MD                             Medical Decision Making Amount and/or Complexity of Data Reviewed Labs: ordered. Radiology: ordered. Decision-making details documented in ED Course. ECG/medicine tests: ordered.  Risk OTC drugs. Prescription drug management. Decision regarding hospitalization.   BP 120/76   Pulse 72   Temp 98.4 F (36.9 C) (Oral)   Resp 18   Ht 5' 2"$  (1.575 m)   Wt 89.8 kg   SpO2 96%   BMI 36.21 kg/m   64:57 AM 37 year old female presents today with complaints of headache and chest pain.  Patient report this morning she was  doing fine and of her normal self.  She developed acute onset severe throbbing pulsating frontal headache approximately 3 hours ago with associated nausea, cold chills, and generalized weakness.  Headache spread towards her neck and chest lasting for approximately 15 minutes and did improved.  Headache now is waxing waning not improved with Tylenol.  No fever no vision changes diaphoresis exertional chest pain or shortness of breath.  No rash.  She does not normally have headaches.  Patient states "I have never had an aneurysm before but  I worry about this headache".  Patient denies runny nose sneezing or coughing.  She does not have any significant cardiac history.  She does not smoke or drink alcohol excessively.  On exam patient is laying in bed with eyes closed appears slightly uncomfortable.  No focal neurodeficit appreciated.  No nuchal rigidity.  She is globally weak but exhibit equal strength throughout.  She has normal speech.  Pupils equal and reactive and extraocular movements are intact.  No carotid bruit or pulsatile mass in the neck.  Given acute onset of severe headache that spreads towards her neck and chest, plan to obtain head and a CT angiogram to rule out dissection/aneurysm or subarachnoid hemorrhage.  Headache is not consistent with meningitis, and she does not have any focal neurodeficits to suggest hemorrhagic stroke.  Workup initiated.  11:22 AM I have reached out to on-call radiologist with recommendations on imaging.  Radiologist Dr. Burt Ek felt head and neck CTA along with dissection study of the chest abdomen pelvis to be done with the same contrast bolus.  I have also request for our radiology tech to take patient to the CT scanner promptly.  I have activated code medical.  I am concerning for subarachnoid hemorrhage.  Patient has elevated troponin of 1900, suspect possible scad versus demand ischemia.  Doubt ACS.  -Labs ordered, independently viewed and interpreted by me.  Labs remarkable for trop is markedly elevated at 1,930 with normal EKG.  Suspect troponin leaks from demand ischemia.  Possible SCAD. Discussed care with DR. Countryman . Will obtain headneck CTA along with dissection study of the chest.  I also discussed with radiologist Dr. Burt Ek.  -The patient was maintained on a cardiac monitor.  I personally viewed and interpreted the cardiac monitored which showed an underlying rhythm of: NSR -Imaging independently viewed and interpreted by me and I agree with radiologist's interpretation.   Result remarkable for head/neck CTA along with chest/abd/pelvis CTA showing no evidence of dissection, stenosis or other acute abnormality.  THere is a rounded filling defects within the distal right transverse sinus which is likely an arachnoid granulations but dural venous sinus thrombosis is difficult to excluded.  Radiologist recommend brain MRI with and without CM if clinically warranted.   -This patient presents to the ED for concern of headache, this involves an extensive number of treatment options, and is a complaint that carries with it a high risk of complications and morbidity.  The differential diagnosis includes SAH, migraine, tension, vertebral artery dissection, aneurysm, meningitis, cluster headache, acs -Co morbidities that complicate the patient evaluation includes obesity -Treatment includes morphine -Reevaluation of the patient after these medicines showed that the patient improved -PCP office notes or outside notes reviewed -Discussion with attending Dr. Oswald Hillock -Escalation to admission/observation considered: patient is agreeable with hospital admission  2:43 PM Second set of troponin is elevated at 5169 from 1000 130.  On reassessment patient is resting comfortably and reports no additional chest pain or worsening headache.  3:21 PM Cardiology has been consulted, will see patient and will likely admit patient.  Patient is receiving heparin for suspected NSTEMI.        Final Clinical Impression(s) / ED Diagnoses Final diagnoses:  NSTEMI (non-ST elevated myocardial infarction) [facility])  Bad headache    Rx / DC Orders ED Discharge Orders     None         Domenic Moras, PA-C 03/03/22 1532    Tretha Sciara, MD 03/05/22 1939

## 2022-03-03 NOTE — Progress Notes (Addendum)
[facility] for heparin Indication: chest pain/ACS  Heparin Dosing Weight: 70.8 kg  Labs: Recent Labs    03/03/22 0908  HGB 13.4  HCT 41.6  PLT 279  CREATININE 0.98    Assessment: 40 yof presenting with CP and headache. Imaging acutely negative for aneurysm or dissection, no bleeding. CTA head and neck did show arachnoid granulations; however, dural venous sinus thrombosis is difficult to exclude. Neurology recommending MRI. Troponin elevated. Pharmacy consulted to dose heparin infusion for ACS. Patient is not on anticoagulation PTA.  Goal of Therapy:  Heparin level 0.3-0.7 units/ml Monitor platelets by anticoagulation protocol: Yes   Plan:  Heparin 4000 unit bolus Start heparin at 850 units/hr Check 6hr heparin level Monitor daily CBC, s/sx bleeding F/u Cardiology plans   Arturo Morton, PharmD, BCPS Clinical Pharmacist 03/03/2022 2:48 PM

## 2022-03-03 NOTE — ED Triage Notes (Signed)
Pt came to ED for HA and chest pain that started at 0630. Pt also nauseous. Axox4. VSS.

## 2022-03-03 NOTE — H&P (Addendum)
Cardiology Admission History and Physical   Patient ID: [name] MRN: [id number]; DOB: 18-Feb-1985   Admission date: 03/03/2022  PCP:  Pleas Koch, NP   [facility] Cardiologist:  New  Chief Complaint:  Chest pain and headache  Patient Profile:   Jamie Ortega is a 37 y.o. female with no reported PMH who is being seen 03/03/2022 for the evaluation of chest pain and headache.  History of Present Illness:   Jamie Ortega is a 37 yo female with no significant PMH. She has 3 children, 18, 11 and 3, overall reports uncomplicated pregnancies. She takes no medications on a regular basis. Was in her usual state of health up until this morning. States she was up and went to the bathroom, developed a frontal headache which was followed by centralized chest pain which radiated up into her neck. This worsened as she walked up the stairs. Lasted about 15 minutes and then began to subside. She was able to fall asleep. Woke up later and took her son to daycare. Reports increased feeling of fatigue and intermittent chest "squeezing" ultimately presented to the ED.   In the ED her labs showed sodium 137, potassium 3.5, creatinine 0.98, high-sensitivity troponin 1930>> 5169, WBC 7.8, hemoglobin 13.4, hCG negative.  EKG showed sinus rhythm without acute ST/T wave abnormalities.  CT angio head/neck with partially empty and expanded sella which could be associated with idiopathic cranial hypertension, also rounded filling defects within the distal right transverse sinus that could represent arachnoid granulations however dural venous sinus thrombosis is difficult to exclude (recommendations for MRI with and without contrast). CT angio chest without PE or dissection. Chest x-ray negative.  Cardiology asked to evaluate.  In talking with patient she denies any significant family history of coronary disease or heart problems.  She is a non-smoker, not currently on birth  control.   Past Medical History:  Diagnosis Date   Back pain affecting pregnancy in third trimester 02/10/2018   Genital herpes    Labor and delivery, indication for care 05/02/2018    Past Surgical History:  Procedure Laterality Date   WISDOM TOOTH EXTRACTION       Medications Prior to Admission: Prior to Admission medications   Medication Sig Start Date End Date Taking? Authorizing Provider  amoxicillin-clavulanate (AUGMENTIN) 875-125 MG tablet Take 1 tablet by mouth 2 (two) times daily. 08/10/21   Eugenia Pancoast, FNP  fluticasone (FLONASE) 50 MCG/ACT nasal spray Place 2 sprays into both nostrils daily. 04/18/18   [provider]  meloxicam (MOBIC) 15 MG tablet Take 15 mg by mouth 3 (three) times daily. 07/17/21   [provider]     Allergies:   No Known Allergies  Social History:   Social History   Socioeconomic History   Marital status: Married    Spouse name: Not on file   Number of children: 2   Years of education: Not on file   Highest education level: Not on file  Occupational History   Not on file  Tobacco Use   Smoking status: Never   Smokeless tobacco: Never  Vaping Use   Vaping Use: Never used  Substance and Sexual Activity   Alcohol use: Not Currently    Comment: Socially   Drug use: No   Sexual activity: Not Currently    Partners: Male    Birth control/protection: None  Other Topics Concern   Not on file  Social History Narrative   Married.   2  children.    Works at [employer] [place].    Enjoys kayaking, spending time outdoors.    Social Determinants of Health   Financial Resource Strain: Not on file  Food Insecurity: Not on file  Transportation Needs: Not on file  Physical Activity: Not on file  Stress: Not on file  Social Connections: Not on file  Intimate Partner Violence: Not on file    Family History:   The patient's family history includes Anxiety disorder in her mother; Arthritis in her mother; COPD in her maternal  grandmother; Cancer in her paternal grandfather; Cervical cancer in her mother; Emphysema in her maternal grandmother; Endometriosis in her mother; Hyperlipidemia in her father; Hypertension in her mother; Hyperthyroidism in her mother; Neuropathy in her mother; Post-traumatic stress disorder in her mother; Prostate cancer in her maternal grandfather; Rheum arthritis in her maternal grandmother. There is no history of Ovarian cancer, Colon cancer, or Breast cancer.    ROS:  Please see the history of present illness.  All other ROS reviewed and negative.     Physical Exam/Data:   Vitals:   03/03/22 1229 03/03/22 1330 03/03/22 1500 03/03/22 1530  BP:  111/77  122/81  Pulse:  71  66  Resp:  14  20  Temp: 98 F (36.7 C)  98.2 F (36.8 C)   TempSrc: Oral  Oral   SpO2:  99%  99%  Weight:      Height:       No intake or output data in the 24 hours ending 03/03/22 1553    03/03/2022    8:59 AM 08/10/2021    9:28 AM 03/06/2020    3:11 PM  Last 3 Weights  Weight (lbs) 198 lb 218 lb 204 lb  Weight (kg) 89.812 kg 98.884 kg 92.534 kg     Body mass index is 36.21 kg/m.  General:  Well nourished, well developed, in no acute distress HEENT: normal Neck: no JVD Vascular: No carotid bruits; Distal pulses 2+ bilaterally   Cardiac:  normal S1, S2; RRR; no murmur  Lungs:  clear to auscultation bilaterally, no wheezing, rhonchi or rales  Abd: soft, nontender, no hepatomegaly  Ext: no edema Musculoskeletal:  No deformities, BUE and BLE strength normal and equal Skin: warm and dry  Neuro:  CNs 2-12 intact, no focal abnormalities noted Psych:  Normal affect    EKG:  The ECG that was done 2/14 was personally reviewed and demonstrates Sinus Rhythm, 75 bpm no ST/T wave changes   Relevant CV Studies:  N/a   Laboratory Data:  High Sensitivity Troponin:   Recent Labs  Lab 03/03/22 0908 03/03/22 1243  TROPONINIHS 1,930* 5,169*      Chemistry Recent Labs  Lab 03/03/22 0908  NA 137  K  3.5  CL 109  CO2 22  GLUCOSE 80  BUN 14  CREATININE 0.98  CALCIUM 8.7*  GFRNONAA >60  ANIONGAP 6    Recent Labs  Lab 03/03/22 0908  PROT 6.6  ALBUMIN 3.6  AST 29  ALT 23  ALKPHOS 59  BILITOT 0.4   Lipids No results for input(s): "CHOL", "TRIG", "HDL", "LABVLDL", "LDLCALC", "CHOLHDL" in the last 168 hours. Hematology Recent Labs  Lab 03/03/22 0908  WBC 7.8  RBC 4.30  HGB 13.4  HCT 41.6  MCV 96.7  MCH 31.2  MCHC 32.2  RDW 14.5  PLT 279   Thyroid No results for input(s): "TSH", "FREET4" in the last 168 hours. BNPNo results for input(s): "BNP", "PROBNP" in  the last 168 hours.  DDimer No results for input(s): "DDIMER" in the last 168 hours.   Radiology/Studies:  CT ANGIO HEAD NECK W WO CM  Result Date: 03/03/2022 CLINICAL DATA:  Vertebral artery dissection suspected r/o SAH EXAM: CT ANGIOGRAPHY HEAD AND NECK TECHNIQUE: Multidetector CT imaging of the head and neck was performed using the standard protocol during bolus administration of intravenous contrast. Multiplanar CT image reconstructions and MIPs were obtained to evaluate the vascular anatomy. Carotid stenosis measurements (when applicable) are obtained utilizing NASCET criteria, using the distal internal carotid diameter as the denominator. RADIATION DOSE REDUCTION: This exam was performed according to the departmental dose-optimization program which includes automated exposure control, adjustment of the mA and/or kV according to patient size and/or use of iterative reconstruction technique. CONTRAST:  115m OMNIPAQUE IOHEXOL 350 MG/ML SOLN COMPARISON:  None Available. FINDINGS: CT HEAD FINDINGS Brain: No evidence of acute infarction, hemorrhage, hydrocephalus, extra-axial collection or mass lesion/mass effect. Partially empty and expanded sella. Vascular: See below. Skull: No acute fracture. Sinuses/Orbits: No acute finding. Review of the MIP images confirms the above findings CTA NECK FINDINGS Aortic arch: Patent  without significant stenosis. Right carotid system: No evidence of dissection, stenosis (50% or greater), or occlusion. Left carotid system: No evidence of dissection, stenosis (50% or greater), or occlusion. Vertebral arteries: Right dominant. No evidence of dissection, stenosis (50% or greater), or occlusion. Skeleton: No acute fracture. Other neck: Subcentimeter right thyroid nodule which does not require further imaging follow-up. (ref: J Am Coll Radiol. 2015 Feb;12(2): 143-50). Upper chest: Visualized lung apices are clear. Review of the MIP images confirms the above findings CTA HEAD FINDINGS Anterior circulation: Hypoplastic right A1 ACA, likely congenital. Otherwise, bilateral intracranial ICAs, MCAs, and ACAs are patent without proximal hemodynamically significant stenosis. No aneurysm identified. Posterior circulation: Bilateral intradural vertebral arteries, basilar artery and bilateral posterior cerebral arteries are patent without proximal hemodynamically significant stenosis. Small vertebrobasilar system with right fetal type PCA, anatomic variant. No aneurysm identified. Venous sinuses: Somewhat rounded filling defects within the distal right transverse sinus. Otherwise, the visualized dural venous sinuses are patent. Anatomic variants: Detailed above. Review of the MIP images confirms the above findings IMPRESSION: CT head: 1. No evidence of acute intracranial abnormality. 2. Partially empty and expanded sella, which is often a normal anatomic variant but can be associated with idiopathic intracranial hypertension (pseudotumor cerebri). CTA: 1. No large vessel occlusion, proximal hemodynamically significant stenosis, or evidence of dissection. 2. Somewhat rounded filling defects within the distal right transverse sinus. These could potentially represent arachnoid granulations; however, dural venous sinus thrombosis is difficult to exclude. An MRI with and without contrast could further assess if  clinically warranted. Electronically Signed   By: FMargaretha SheffieldM.D.   On: 03/03/2022 13:12   CT Angio Chest/Abd/Pel for Dissection W and/or Wo Contrast  Result Date: 03/03/2022 CLINICAL DATA:  Concern for acute aortic dissection. EXAM: CT ANGIOGRAPHY CHEST, ABDOMEN AND PELVIS TECHNIQUE: Non-contrast CT of the chest was initially obtained. Multidetector CT imaging through the chest, abdomen and pelvis was performed using the standard protocol during bolus administration of intravenous contrast. Multiplanar reconstructed images and MIPs were obtained and reviewed to evaluate the vascular anatomy. RADIATION DOSE REDUCTION: This exam was performed according to the departmental dose-optimization program which includes automated exposure control, adjustment of the mA and/or kV according to patient size and/or use of iterative reconstruction technique. CONTRAST:  1038mOMNIPAQUE IOHEXOL 350 MG/ML SOLN COMPARISON:  Ultrasound March 06, 2019 and July 08, 2017. FINDINGS:  CTA CHEST FINDINGS Cardiovascular: Noncontrast enhanced portion of the examination demonstrates minimal aortic atherosclerosis without intramural hematoma. Preferential opacification of the thoracic aorta post contrast injection without evidence of thoracic aortic aneurysm or dissection. Three-vessel arch. No central pulmonary embolus on this nondedicated study. Normal heart size. No significant pericardial effusion/thickening. Mediastinum/Nodes: No suspicious thyroid nodule. No pathologically enlarged mediastinal, hilar or axillary lymph nodes. The esophagus is grossly unremarkable. Lungs/Pleura: Hypoventilatory change in the dependent lungs. No suspicious pulmonary nodules or masses. No pleural effusion. No pneumothorax. Musculoskeletal: No chest wall abnormality. No acute or significant osseous findings. Review of the MIP images confirms the above findings. CTA ABDOMEN AND PELVIS FINDINGS VASCULAR Aorta: Normal caliber aorta without aneurysm,  dissection, vasculitis or significant stenosis. Celiac: Patent without evidence of aneurysm, dissection, vasculitis or significant stenosis. SMA: Patent without evidence of aneurysm, dissection, vasculitis or significant stenosis. Renals: Both renal arteries are patent without evidence of aneurysm, dissection, vasculitis, fibromuscular dysplasia or significant stenosis. IMA: Patent without evidence of aneurysm, dissection, vasculitis or significant stenosis. Inflow: Patent without evidence of aneurysm, dissection, vasculitis or significant stenosis. Veins: No obvious venous abnormality within the limitations of this arterial phase study. Review of the MIP images confirms the above findings. NON-VASCULAR Hepatobiliary: Cyst in the right lobe of the liver. Gallbladder is unremarkable. No biliary ductal dilation. Pancreas: No pancreatic ductal dilation or evidence of acute inflammation. Spleen: No splenomegaly. Adrenals/Urinary Tract: Bilateral adrenal glands appear normal. Bilateral hypodense renal lesions are technically too small to accurately characterize but statistically likely reflect cysts and considered benign requiring no independent imaging follow-up. Urinary bladder is unremarkable for degree of distension. Stomach/Bowel: Stomach is unremarkable for degree of distension. No pathologic dilation of small or large bowel. No evidence of acute bowel inflammation. Normal appendix and terminal ileum. Lymphatic: No pathologically enlarged abdominal or pelvic lymph nodes. Reproductive: Enlarged uterus with lobular uterine contour. No suspicious adnexal mass. Other: No significant abdominopelvic free fluid. Musculoskeletal: No acute osseous abnormality. Review of the MIP images confirms the above findings. IMPRESSION: 1. No evidence of aortic aneurysm or dissection. 2. No acute abnormality in the chest, abdomen, or pelvis. 3. Enlarged uterus with lobular uterine contour may reflect leiomyomas. Electronically Signed    By: Dahlia Bailiff M.D.   On: 03/03/2022 12:57   DG Chest Port 1 View  Result Date: 03/03/2022 CLINICAL DATA:  Chest pain. EXAM: PORTABLE CHEST 1 VIEW COMPARISON:  None Available. FINDINGS: Low lung volumes. No consolidation. No visible pleural effusions or pneumothorax. Cardiomediastinal silhouette is within normal limits for technique. IMPRESSION: No active disease. Electronically Signed   By: Margaretha Sheffield M.D.   On: 03/03/2022 09:47     Assessment and Plan:   Jamie Ortega is a 37 y.o. female with no reported PMH who is being seen 03/03/2022 for the evaluation of chest pain and headache.  NSTEMI -- Presented with centralized chest tightness with radiation of into her neck this morning.  Symptoms improved but continued to linger with chest squeezing intermittently.  High-sensitivity troponin 1930>> 5169.  EKG without ischemic changes.  He does not have significant risk factors for coronary disease . Given her young age and symptoms, there is concern for SCAD.  Discussed coronary angiography with patient and significant other at bedside.  -- Has been started on IV heparin while in the ED -- start ASA -- check lipids and A1c  -- check echo   Shared Decision Making/Informed Consent The risks [stroke (1 in 1000), death (1 in 1000), kidney failure [facility]  temporary] (1 in 500), bleeding (1 in 200), allergic reaction [possibly serious] (1 in 200)], benefits (diagnostic support and management of coronary artery disease) and alternatives of a cardiac catheterization were discussed in detail with Jamie Ortega and she is willing to proceed.  Headache -- CT angio head with partially empty and expanded sella, could be associated with idiopathic intracranial HTN, also rounded filling defects within the distal right transverse sinus that could represent arachnoid granulations however dural venous sinus thrombosis is difficult to exclude  -- recommendations for follow up MRI w and w/o  contrast when appropriate     Risk Assessment/Risk Scores:    TIMI Risk Score for Unstable Angina or Non-ST Elevation MI:   The patient's TIMI risk score is 1, which indicates a 5% risk of all cause mortality, new or recurrent myocardial infarction or need for urgent revascularization in the next 14 days.  Severity of Illness: The appropriate patient status for this patient is INPATIENT. Inpatient status is judged to be reasonable and necessary in order to provide the required intensity of service to ensure the patient's safety. The patient's presenting symptoms, physical exam findings, and initial radiographic and laboratory data in the context of their chronic comorbidities is felt to place them at high risk for further clinical deterioration. Furthermore, it is not anticipated that the patient will be medically stable for discharge from the hospital within 2 midnights of admission.   * I certify that at the point of admission it is my clinical judgment that the patient will require inpatient hospital care spanning beyond 2 midnights from the point of admission due to high intensity of service, high risk for further deterioration and high frequency of surveillance required.*   For questions or updates, please contact [place] Please consult [email or web address] for contact info under     Signed, Reino Bellis, NP  03/03/2022 3:53 PM   Patient seen, examined. Available data reviewed. Agree with findings, assessment, and plan as outlined by Reino Bellis, NP.  The patient is independently interviewed and examined.  She is alert, oriented, no distress.  HEENT is normal, JVP is normal, carotid upstrokes are normal without bruits, lungs are clear to auscultation bilaterally, heart is regular rate and rhythm no murmur gallop, abdomen is soft and nontender, there are no masses, extremities have no edema, skin is warm and dry with no rash.  The patient presents with non-STEMI, with both  headache and central chest pressure/squeezing sensation.  She has ruled in for MI with an initial high-sensitivity troponin of 1930 and a second troponin of 5169.  Her EKG shows no acute ischemic changes.  She does not appear to have any major risk factors for premature atherosclerosis.  She underwent CT angiography studies of the head and neck as well as the chest, abdomen, and pelvis.  These do not demonstrate any evidence of atherosclerotic disease.  Considering her significant troponin elevation and intermittent chest discomfort that continues to occur, cardiac catheterization is indicated. I have reviewed the risks, indications, and alternatives to cardiac catheterization, possible angioplasty, and stenting with the patient. Risks include but are not limited to bleeding, infection, vascular injury, stroke, myocardial infection, arrhythmia, kidney injury, radiation-related injury in the case of prolonged fluoroscopy use, emergency cardiac surgery, and death. The patient understands the risks of serious complication is 1-2 in [id number] with diagnostic cardiac cath and 1-2% or less with angioplasty/stenting.  I think the patient is at risk for spontaneous coronary artery dissection which could be the etiology  of her non-STEMI.  She understands that there is no way to know this unless we proceed with cardiac catheterization.  Further plans/disposition pending her cardiac catheterization result. Otherwise as outlined above. Currently on IV heparin, chest pain free at the time of my interview/exam.  Sherren Mocha, M.D. 03/03/2022 4:07 PM

## 2022-03-03 NOTE — Progress Notes (Addendum)
Received notification from nurse that pt had 20 beat run NSVT - telemetry reviewed and confirms this. Had slight palpitations/chest pressure with this. Otherwise showed NSR. Currently NSR HR 80s, last SBP 116. D/w Dr. Dellia Cloud. K 3.5 this AM at 9:08am -> will give 16mq now, will also repeat BMET/Mg now with care order to call on call cardiology team when this results. Spoke to nurse. Will also start metoprolol 212mBID. Per verbal discussions this evening, day team earlier was considering potential cMRI tomorrow to evaluate for myocarditis since cath was OK. Echo also pending at this time. Addendum: K was 3.2, drawn prior to initial KCl 4062mx 1. We'll repeat dose at 2300. Mg wnl.

## 2022-03-03 NOTE — Interval H&P Note (Signed)
History and Physical Interval Note:  03/03/2022 4:12 PM  Chardae Buczek  has presented today for surgery, with the diagnosis of nstemi.  The various methods of treatment have been discussed with the patient and family. After consideration of risks, benefits and other options for treatment, the patient has consented to  Procedure(s): LEFT HEART CATH AND CORONARY ANGIOGRAPHY (N/A) as a surgical intervention.  The patient's history has been reviewed, patient examined, no change in status, stable for surgery.  I have reviewed the patient's chart and labs.  Questions were answered to the patient's satisfaction.    Cath Lab Visit (complete for each Cath Lab visit)  Clinical Evaluation Leading to the Procedure:   ACS: Yes.    Non-ACS:    Anginal Classification: CCS IV  Anti-ischemic medical therapy: No Therapy  Non-Invasive Test Results: No non-invasive testing performed  Prior CABG: No previous CABG       Collier [facility] 03/03/2022 4:12 PM

## 2022-03-04 ENCOUNTER — Inpatient Hospital Stay (HOSPITAL_COMMUNITY): Payer: BC Managed Care – PPO

## 2022-03-04 ENCOUNTER — Encounter (HOSPITAL_COMMUNITY): Payer: Self-pay | Admitting: Cardiology

## 2022-03-04 DIAGNOSIS — I214 Non-ST elevation (NSTEMI) myocardial infarction: Secondary | ICD-10-CM | POA: Diagnosis not present

## 2022-03-04 DIAGNOSIS — R079 Chest pain, unspecified: Secondary | ICD-10-CM | POA: Diagnosis not present

## 2022-03-04 LAB — LIPID PANEL
Cholesterol: 166 mg/dL (ref 0–200)
HDL: 46 mg/dL (ref >40)
LDL Cholesterol: 94 mg/dL (ref 0–99)
Total CHOL/HDL Ratio: 3.6 RATIO
Triglycerides: 131 mg/dL (ref <150)
VLDL: 26 mg/dL (ref 0–40)

## 2022-03-04 LAB — MAGNESIUM: Magnesium: 2.1 mg/dL (ref 1.7–2.4)

## 2022-03-04 LAB — ECHOCARDIOGRAM COMPLETE
AR max vel: 2.31 cm2
AV Area VTI: 2.25 cm2
AV Area mean vel: 2.22 cm2
AV Mean grad: 4 mmHg
AV Peak grad: 7.8 mmHg
Ao pk vel: 1.4 m/s
Area-P 1/2: 3.65 cm2
Height: 62 in
S' Lateral: 3 cm
Weight: 3259.2 oz

## 2022-03-04 LAB — CBC
HCT: 39.6 % (ref 36.0–46.0)
Hemoglobin: 12.9 g/dL (ref 12.0–15.0)
MCH: 30.8 pg (ref 26.0–34.0)
MCHC: 32.6 g/dL (ref 30.0–36.0)
MCV: 94.5 fL (ref 80.0–100.0)
Platelets: 281 10*3/uL (ref 150–400)
RBC: 4.19 MIL/uL (ref 3.87–5.11)
RDW: 14.5 % (ref 11.5–15.5)
WBC: 7.3 10*3/uL (ref 4.0–10.5)
nRBC: 0 % (ref 0.0–0.2)

## 2022-03-04 LAB — BASIC METABOLIC PANEL
Anion gap: 8 (ref 5–15)
BUN: 11 mg/dL (ref 6–20)
CO2: 23 mmol/L (ref 22–32)
Calcium: 8.8 mg/dL — ABNORMAL LOW (ref 8.9–10.3)
Chloride: 106 mmol/L (ref 98–111)
Creatinine, Ser: 0.99 mg/dL (ref 0.44–1.00)
GFR, Estimated: >60 mL/min (ref >60)
Glucose, Bld: 111 mg/dL — ABNORMAL HIGH (ref 70–99)
Potassium: 4.3 mmol/L (ref 3.5–5.1)
Sodium: 137 mmol/L (ref 135–145)

## 2022-03-04 LAB — HEMOGLOBIN A1C
Hgb A1c MFr Bld: 5.3 % (ref 4.8–5.6)
Mean Plasma Glucose: 105.41 mg/dL

## 2022-03-04 LAB — RAPID URINE DRUG SCREEN, HOSP PERFORMED
Amphetamines: NOT DETECTED
Barbiturates: NOT DETECTED
Benzodiazepines: POSITIVE — AB
Cocaine: NOT DETECTED
Opiates: NOT DETECTED
Tetrahydrocannabinol: NOT DETECTED

## 2022-03-04 LAB — TROPONIN I (HIGH SENSITIVITY): Troponin I (High Sensitivity): 3561 ng/L (ref <18)

## 2022-03-04 MED ORDER — COLCHICINE 0.6 MG PO TABS
0.6000 mg | ORAL_TABLET | Freq: Two times a day (BID) | ORAL | Status: DC
  Administered 2022-03-04 – 2022-03-05 (×3): 0.6 mg via ORAL
  Filled 2022-03-04 (×3): qty 1

## 2022-03-04 MED ORDER — IBUPROFEN 600 MG PO TABS
800.0000 mg | ORAL_TABLET | Freq: Three times a day (TID) | ORAL | Status: DC
  Administered 2022-03-04 – 2022-03-05 (×3): 800 mg via ORAL
  Filled 2022-03-04 (×3): qty 1

## 2022-03-04 MED ORDER — GADOBUTROL 1 MMOL/ML IV SOLN
10.0000 mL | Freq: Once | INTRAVENOUS | Status: AC | PRN
  Administered 2022-03-04: 10 mL via INTRAVENOUS

## 2022-03-04 NOTE — Progress Notes (Addendum)
Pt stated that she is having CP 4/10, described as "squeezing". Attempted to contact [place], MD via text page; no new orders at this time.   Elaina Hoops, RN

## 2022-03-04 NOTE — Progress Notes (Signed)
Echocardiogram 2D Echocardiogram has been performed.  Jamie Ortega 03/04/2022, 3:13 PM

## 2022-03-04 NOTE — Progress Notes (Signed)
Rounding Note    Patient Name: Jamie Ortega Date of Encounter: 03/04/2022  [place] Cardiologist: None   Subjective   Had some mild chest discomfort overnight, no further headache, no chest pain at present.  No shortness of breath.  Inpatient Medications    Scheduled Meds:  aspirin EC  81 mg Oral Daily   heparin  5,000 Units Subcutaneous Q8H   metoprolol tartrate  25 mg Oral BID   sodium chloride flush  3 mL Intravenous Q12H   sodium chloride flush  3 mL Intravenous Q12H   Continuous Infusions:  sodium chloride     sodium chloride     PRN Meds: sodium chloride, sodium chloride, acetaminophen, nitroGLYCERIN, ondansetron (ZOFRAN) IV, sodium chloride flush, sodium chloride flush   Vital Signs    Vitals:   03/03/22 1736 03/03/22 1933 03/04/22 0500 03/04/22 0547  BP: 124/73 116/76  (!) 101/59  Pulse: 98 72    Resp: 16 18  18  $ Temp: 98.2 F (36.8 C) 98.1 F (36.7 C)  98.1 F (36.7 C)  TempSrc: Oral Oral  Oral  SpO2: 99% 99%  98%  Weight:   92.4 kg   Height:        Intake/Output Summary (Last 24 hours) at 03/04/2022 0746 Last data filed at 03/03/2022 2300 Gross per 24 hour  Intake 537.3 ml  Output --  Net 537.3 ml      03/04/2022    5:00 AM 03/03/2022    8:59 AM 08/10/2021    9:28 AM  Last 3 Weights  Weight (lbs) 203 lb 11.2 oz 198 lb 218 lb  Weight (kg) 92.398 kg 89.812 kg 98.884 kg      Telemetry    Sinus Rhythm, run of NSVT last evening - Personally Reviewed  ECG    Sinus Rhythm, 68 bpm - Personally Reviewed  Physical Exam   GEN: Alert, oriented, no acute distress.   Neck: No JVD Cardiac: RRR, no murmurs, rubs, or gallops.  Respiratory: Clear to auscultation bilaterally. GI: Soft, nontender, non-distended  MS: No edema; No deformity. Right radial cath site clear with no hematoma or ecchymosis Neuro:  Nonfocal  Psych: Normal affect   Labs    High Sensitivity Troponin:   Recent Labs  Lab 03/03/22 0908 03/03/22 1243   TROPONINIHS 1,930* 5,169*     Chemistry Recent Labs  Lab 03/03/22 0908 03/03/22 1833 03/03/22 1851 03/04/22 0312  NA 137  --  137 137  K 3.5  --  3.2* 4.3  CL 109  --  106 106  CO2 22  --  24 23  GLUCOSE 80  --  137* 111*  BUN 14  --  11 11  CREATININE 0.98 1.00 1.00 0.99  CALCIUM 8.7*  --  8.3* 8.8*  MG  --   --  2.2 2.1  PROT 6.6  --   --   --   ALBUMIN 3.6  --   --   --   AST 29  --   --   --   ALT 23  --   --   --   ALKPHOS 59  --   --   --   BILITOT 0.4  --   --   --   GFRNONAA >60 >60 >60 >60  ANIONGAP 6  --  7 8    Lipids  Recent Labs  Lab 03/04/22 0312  CHOL 166  TRIG 131  HDL 46  LDLCALC 94  CHOLHDL 3.6  Hematology Recent Labs  Lab 03/03/22 0908 03/03/22 1833 03/04/22 0312  WBC 7.8 8.0 7.3  RBC 4.30 4.04 4.19  HGB 13.4 12.5 12.9  HCT 41.6 37.3 39.6  MCV 96.7 92.3 94.5  MCH 31.2 30.9 30.8  MCHC 32.2 33.5 32.6  RDW 14.5 14.3 14.5  PLT 279 269 281   Thyroid  Recent Labs  Lab 03/03/22 2006  TSH 0.599    BNPNo results for input(s): "BNP", "PROBNP" in the last 168 hours.  DDimer No results for input(s): "DDIMER" in the last 168 hours.   Radiology    CARDIAC CATHETERIZATION  Result Date: 03/03/2022   The left ventricular systolic function is normal.   LV end diastolic pressure is mildly elevated.   The left ventricular ejection fraction is 55-65% by visual estimate. Normal coronary anatomy. No evidence of SCAD or thrombosis Normal LV function Mildly elevated LVEDP 16-19 mm Hg Plan: consider other etiologies such as myocarditis.   CT ANGIO HEAD NECK W WO CM  Result Date: 03/03/2022 CLINICAL DATA:  Vertebral artery dissection suspected r/o SAH EXAM: CT ANGIOGRAPHY HEAD AND NECK TECHNIQUE: Multidetector CT imaging of the head and neck was performed using the standard protocol during bolus administration of intravenous contrast. Multiplanar CT image reconstructions and MIPs were obtained to evaluate the vascular anatomy. Carotid stenosis  measurements (when applicable) are obtained utilizing NASCET criteria, using the distal internal carotid diameter as the denominator. RADIATION DOSE REDUCTION: This exam was performed according to the departmental dose-optimization program which includes automated exposure control, adjustment of the mA and/or kV according to patient size and/or use of iterative reconstruction technique. CONTRAST:  117m OMNIPAQUE IOHEXOL 350 MG/ML SOLN COMPARISON:  None Available. FINDINGS: CT HEAD FINDINGS Brain: No evidence of acute infarction, hemorrhage, hydrocephalus, extra-axial collection or mass lesion/mass effect. Partially empty and expanded sella. Vascular: See below. Skull: No acute fracture. Sinuses/Orbits: No acute finding. Review of the MIP images confirms the above findings CTA NECK FINDINGS Aortic arch: Patent without significant stenosis. Right carotid system: No evidence of dissection, stenosis (50% or greater), or occlusion. Left carotid system: No evidence of dissection, stenosis (50% or greater), or occlusion. Vertebral arteries: Right dominant. No evidence of dissection, stenosis (50% or greater), or occlusion. Skeleton: No acute fracture. Other neck: Subcentimeter right thyroid nodule which does not require further imaging follow-up. (ref: J Am Coll Radiol. 2015 Feb;12(2): 143-50). Upper chest: Visualized lung apices are clear. Review of the MIP images confirms the above findings CTA HEAD FINDINGS Anterior circulation: Hypoplastic right A1 ACA, likely congenital. Otherwise, bilateral intracranial ICAs, MCAs, and ACAs are patent without proximal hemodynamically significant stenosis. No aneurysm identified. Posterior circulation: Bilateral intradural vertebral arteries, basilar artery and bilateral posterior cerebral arteries are patent without proximal hemodynamically significant stenosis. Small vertebrobasilar system with right fetal type PCA, anatomic variant. No aneurysm identified. Venous sinuses: Somewhat  rounded filling defects within the distal right transverse sinus. Otherwise, the visualized dural venous sinuses are patent. Anatomic variants: Detailed above. Review of the MIP images confirms the above findings IMPRESSION: CT head: 1. No evidence of acute intracranial abnormality. 2. Partially empty and expanded sella, which is often a normal anatomic variant but can be associated with idiopathic intracranial hypertension (pseudotumor cerebri). CTA: 1. No large vessel occlusion, proximal hemodynamically significant stenosis, or evidence of dissection. 2. Somewhat rounded filling defects within the distal right transverse sinus. These could potentially represent arachnoid granulations; however, dural venous sinus thrombosis is difficult to exclude. An MRI with and without contrast could further assess  if clinically warranted. Electronically Signed   By: Margaretha Sheffield M.D.   On: 03/03/2022 13:12   CT Angio Chest/Abd/Pel for Dissection W and/or Wo Contrast  Result Date: 03/03/2022 CLINICAL DATA:  Concern for acute aortic dissection. EXAM: CT ANGIOGRAPHY CHEST, ABDOMEN AND PELVIS TECHNIQUE: Non-contrast CT of the chest was initially obtained. Multidetector CT imaging through the chest, abdomen and pelvis was performed using the standard protocol during bolus administration of intravenous contrast. Multiplanar reconstructed images and MIPs were obtained and reviewed to evaluate the vascular anatomy. RADIATION DOSE REDUCTION: This exam was performed according to the departmental dose-optimization program which includes automated exposure control, adjustment of the mA and/or kV according to patient size and/or use of iterative reconstruction technique. CONTRAST:  155m OMNIPAQUE IOHEXOL 350 MG/ML SOLN COMPARISON:  Ultrasound March 06, 2019 and July 08, 2017. FINDINGS: CTA CHEST FINDINGS Cardiovascular: Noncontrast enhanced portion of the examination demonstrates minimal aortic atherosclerosis without  intramural hematoma. Preferential opacification of the thoracic aorta post contrast injection without evidence of thoracic aortic aneurysm or dissection. Three-vessel arch. No central pulmonary embolus on this nondedicated study. Normal heart size. No significant pericardial effusion/thickening. Mediastinum/Nodes: No suspicious thyroid nodule. No pathologically enlarged mediastinal, hilar or axillary lymph nodes. The esophagus is grossly unremarkable. Lungs/Pleura: Hypoventilatory change in the dependent lungs. No suspicious pulmonary nodules or masses. No pleural effusion. No pneumothorax. Musculoskeletal: No chest wall abnormality. No acute or significant osseous findings. Review of the MIP images confirms the above findings. CTA ABDOMEN AND PELVIS FINDINGS VASCULAR Aorta: Normal caliber aorta without aneurysm, dissection, vasculitis or significant stenosis. Celiac: Patent without evidence of aneurysm, dissection, vasculitis or significant stenosis. SMA: Patent without evidence of aneurysm, dissection, vasculitis or significant stenosis. Renals: Both renal arteries are patent without evidence of aneurysm, dissection, vasculitis, fibromuscular dysplasia or significant stenosis. IMA: Patent without evidence of aneurysm, dissection, vasculitis or significant stenosis. Inflow: Patent without evidence of aneurysm, dissection, vasculitis or significant stenosis. Veins: No obvious venous abnormality within the limitations of this arterial phase study. Review of the MIP images confirms the above findings. NON-VASCULAR Hepatobiliary: Cyst in the right lobe of the liver. Gallbladder is unremarkable. No biliary ductal dilation. Pancreas: No pancreatic ductal dilation or evidence of acute inflammation. Spleen: No splenomegaly. Adrenals/Urinary Tract: Bilateral adrenal glands appear normal. Bilateral hypodense renal lesions are technically too small to accurately characterize but statistically likely reflect cysts and  considered benign requiring no independent imaging follow-up. Urinary bladder is unremarkable for degree of distension. Stomach/Bowel: Stomach is unremarkable for degree of distension. No pathologic dilation of small or large bowel. No evidence of acute bowel inflammation. Normal appendix and terminal ileum. Lymphatic: No pathologically enlarged abdominal or pelvic lymph nodes. Reproductive: Enlarged uterus with lobular uterine contour. No suspicious adnexal mass. Other: No significant abdominopelvic free fluid. Musculoskeletal: No acute osseous abnormality. Review of the MIP images confirms the above findings. IMPRESSION: 1. No evidence of aortic aneurysm or dissection. 2. No acute abnormality in the chest, abdomen, or pelvis. 3. Enlarged uterus with lobular uterine contour may reflect leiomyomas. Electronically Signed   By: JDahlia BailiffM.D.   On: 03/03/2022 12:57   DG Chest Port 1 View  Result Date: 03/03/2022 CLINICAL DATA:  Chest pain. EXAM: PORTABLE CHEST 1 VIEW COMPARISON:  None Available. FINDINGS: Low lung volumes. No consolidation. No visible pleural effusions or pneumothorax. Cardiomediastinal silhouette is within normal limits for technique. IMPRESSION: No active disease. Electronically Signed   By: FMargaretha SheffieldM.D.   On: 03/03/2022 09:47    Cardiac  Studies   Cath: 03/03/2022    The left ventricular systolic function is normal.   LV end diastolic pressure is mildly elevated.   The left ventricular ejection fraction is 55-65% by visual estimate.   Normal coronary anatomy. No evidence of SCAD or thrombosis Normal LV function Mildly elevated LVEDP 16-19 mm Hg   Plan: consider other etiologies such as myocarditis.    Patient Profile     37 y.o. female with no reported PMH who was seen 03/03/2022 for the evaluation of chest pain and headache.   Assessment & Plan    NSTEMI -- Presented with centralized chest tightness with radiation of into her neck this morning.  Symptoms  improved but continued to linger with chest squeezing intermittently.  High-sensitivity troponin 1930>> 5169.  EKG without ischemic changes.  Underwent cardiac cath noted above with no CAD. Plan for cardiac MRI today for myocarditis  -- continue ASA -- echo pending    Headache -- CT angio head with partially empty and expanded sella, could be associated with idiopathic intracranial HTN, also rounded filling defects within the distal right transverse sinus that could represent arachnoid granulations however dural venous sinus thrombosis is difficult to exclude  -- recommendations for follow up MRI w and w/o contrast when appropriate   For questions or updates, please contact [place] Please consult [email or web address] for contact info under        Signed, Reino Bellis, NP  03/04/2022, 7:46 AM    Patient seen, examined. Available data reviewed. Agree with findings, assessment, and plan as outlined by Reino Bellis, NP. On my exam: Vitals:   03/04/22 0547 03/04/22 0813  BP: (!) 101/59 110/61  Pulse:  86  Resp: 18   Temp: 98.1 F (36.7 C)   SpO2: 98%    Pt is alert and oriented, NAD HEENT: normal Neck: JVP - normal, carotids 2+= without bruits Lungs: CTA bilaterally CV: RRR without murmur or gallop Abd: soft, NT, Positive BS, no hepatomegaly Ext: no C/C/E, distal pulses intact and equal. Right radial cath site clear.  Skin: warm/dry no rash  Extensive discussion with the patient this morning.  Personally reviewed her telemetry which showed a 20 beat run of VT overnight.  She has been started on a beta-blocker as above.  She was symptomatic with heart palpitations at the time of her VT.  Otherwise appears to be doing well this morning with no significant ectopy.  Current medications include only aspirin and metoprolol.  Can consider a statin drug pending MRI results, but the patient was found to have angiographically normal coronary arteries.  It is clear that she did not  have an atherosclerotic/plaque rupture type of MI.  Once her MRI is reported, we can better understand if she may have acute myocarditis versus some other nonatherosclerotic mechanism of MI.  She did not have any evidence of spontaneous coronary artery dissection or intramural hematoma on her coronary angiogram.  Will monitor on telemetry for another 24 hours and follow-up tomorrow with the results of her cardiac MRI.   Sherren Mocha, M.D. 03/04/2022 12:00 PM

## 2022-03-04 NOTE — Care Management (Signed)
  Transition of Care [facility]) Screening Note   Patient Details  Name: Jamie Ortega Date of Birth: 1985-06-14   Transition of Care [facility]) CM/SW Contact:    Bethena Roys, RN Phone Number: 03/04/2022, 12:02 PM    Transition of Care Department [facility]) has reviewed the patient and no TOC needs have been identified at this time. We will continue to monitor patient advancement through interdisciplinary progression rounds. If new patient transition needs arise, please place a TOC consult.

## 2022-03-05 ENCOUNTER — Other Ambulatory Visit (HOSPITAL_COMMUNITY): Payer: Self-pay

## 2022-03-05 DIAGNOSIS — E876 Hypokalemia: Secondary | ICD-10-CM

## 2022-03-05 DIAGNOSIS — I4729 Other ventricular tachycardia: Secondary | ICD-10-CM | POA: Insufficient documentation

## 2022-03-05 DIAGNOSIS — I319 Disease of pericardium, unspecified: Secondary | ICD-10-CM | POA: Insufficient documentation

## 2022-03-05 DIAGNOSIS — I309 Acute pericarditis, unspecified: Secondary | ICD-10-CM | POA: Diagnosis not present

## 2022-03-05 HISTORY — DX: Hypokalemia: E87.6

## 2022-03-05 HISTORY — DX: Other ventricular tachycardia: I47.29

## 2022-03-05 LAB — CBC
HCT: 38.6 % (ref 36.0–46.0)
Hemoglobin: 12.4 g/dL (ref 12.0–15.0)
MCH: 30.5 pg (ref 26.0–34.0)
MCHC: 32.1 g/dL (ref 30.0–36.0)
MCV: 95.1 fL (ref 80.0–100.0)
Platelets: 270 10*3/uL (ref 150–400)
RBC: 4.06 MIL/uL (ref 3.87–5.11)
RDW: 14.5 % (ref 11.5–15.5)
WBC: 7 10*3/uL (ref 4.0–10.5)
nRBC: 0 % (ref 0.0–0.2)

## 2022-03-05 MED ORDER — IBUPROFEN 400 MG PO TABS
ORAL_TABLET | ORAL | 0 refills | Status: DC
  Filled 2022-03-05: qty 63, 14d supply, fill #0

## 2022-03-05 MED ORDER — COLCHICINE 0.6 MG PO TABS
0.6000 mg | ORAL_TABLET | Freq: Two times a day (BID) | ORAL | 0 refills | Status: DC
  Filled 2022-03-05: qty 60, 30d supply, fill #0

## 2022-03-05 MED ORDER — METOPROLOL TARTRATE 25 MG PO TABS
25.0000 mg | ORAL_TABLET | Freq: Two times a day (BID) | ORAL | 3 refills | Status: DC
  Filled 2022-03-05: qty 60, 30d supply, fill #0

## 2022-03-05 MED ORDER — ALUM & MAG HYDROXIDE-SIMETH 200-200-20 MG/5ML PO SUSP
30.0000 mL | Freq: Four times a day (QID) | ORAL | Status: DC | PRN
  Administered 2022-03-05: 30 mL via ORAL
  Filled 2022-03-05: qty 30

## 2022-03-05 NOTE — Progress Notes (Signed)
Pt official diagnosis myocarditis (not NSTEMI) therefore will not follow. Will sign off. Yves Dill BS, ACSM-CEP 03/05/2022 10:14 AM

## 2022-03-05 NOTE — TOC Benefit Eligibility Note (Signed)
Patient Teacher, English as a foreign language completed.    The patient is currently admitted and upon discharge could be taking colchicine 0.6 mg tablets.  The current 30 day co-pay is $0.00.   The patient is insured through [employer] [place] [employer]   Anyjah Roundtree, [place] Patient Advocate Specialist [place] Patient Advocate Team Direct Number: [phone]  Fax: [phone]

## 2022-03-05 NOTE — Discharge Summary (Addendum)
Discharge Summary    Patient ID: Nashaly Leib MRN: [id number]; DOB: 03-06-1985  Admit date: 03/03/2022 Discharge date: 03/05/2022  PCP:  Pleas Koch, NP   [facility] Prov[facility] Cardiologist:  Sherren Mocha, MD    Discharge Diagnoses    Principal Problem:   Acute myopericarditis Active Problems:   NSVT (nonsustained ventricular tachycardia) ([place])   Hypokalemia    Diagnostic Studies/Procedures    Left Heart Catheterization 03/03/22   The left ventricular systolic function is normal.   LV end diastolic pressure is mildly elevated.   The left ventricular ejection fraction is 55-65% by visual estimate.   Normal coronary anatomy. No evidence of SCAD or thrombosis Normal LV function Mildly elevated LVEDP 16-19 mm Hg   Plan: consider other etiologies such as myocarditis.   Cardiac MRI 03/04/22 1. Normal left ventricular size, with LVEDD 50 mm, and LVEDVi 70 mL/m2.   Normal left ventricular thickness, with intraventricular septal thickness of 8 mm, posterior wall thickness of 8 mm, and myocardial mass index of 43 g/m2.   Normal left ventricular systolic function (LVEF [id number]). There are no regional wall motion abnormalities.   Left ventricular parametric mapping notable for increased apical lateral T2 signal (62 ms).   Suboptimal Native T1 acquisition limits ECV analysis.   There is late gadolinium enhancement in the left ventricular myocardium: Transmural apical lateral LGE.   2.  Mild increase in right ventricular size with RVEDVI 88 mL/m2.   Normal right ventricular thickness.   Low normal right ventricular systolic function (RVEF [id number]). There are no regional wall motion abnormalities or aneurysms.   3.  Normal left and right atrial size.   4. Normal size of the aortic root, ascending aorta and pulmonary artery.   5. Valve assessment:   Aortic Valve: Tri-leaflet aortic valve. There is no significant regurgitation. Regurgitant  fraction < 1%.   Pulmonic Valve: There is no significant regurgitation. Regurgitant fraction < 1%.   Tricuspid Valve: There is mild central regurgitation. Regurgitant fraction 7%.   Mitral Valve: There is mild central regurgitation. Regurgitant fraction 13%.   6. Apical lateral pericardial thickening with pericardial hyper-enhancement. Trivial pericardial effusion.   7. Grossly, no extracardiac findings. Recommended dedicated study if concerned for non-cardiac pathology.   IMPRESSION: Study meets 2018 [facility] for myopericarditis.   Echocardiogram 03/04/22  1. Left ventricular ejection fraction, by estimation, is 65 to 70%. The  left ventricle has normal function. The left ventricle has no regional  wall motion abnormalities. Left ventricular diastolic parameters were  normal.   2. Right ventricular systolic function is normal. The right ventricular  size is normal.   3. The mitral valve is normal in structure. No evidence of mitral valve  regurgitation.   4. The aortic valve is tricuspid. Aortic valve regurgitation is not  visualized.   5. The inferior vena cava is normal in size with greater than 50%  respiratory variability, suggesting right atrial pressure of 3 mmHg.  _____________   History of Present Illness     Khristine Vereb is a 37 y.o. female with out reported PMH. Patient has 3 children, ages 66, 87, and 3. Overall reports that her pregnancies were uncomplicated. She does not take medications on a regular basis. Patient presented on 2/14 complaining that she woke up that morning and went to the bathroom when she developed a frontal headache that was followed by centralized chest pain that radiated up into her neck. Pain worsened when she  walked up the stairs. Chest pani lasted about 15 minutes and then began to subside. She was able to fall asleep. Woke up later to take her son to daycare. Reported having increased feelings of fatigue and  intermittent chest "squeezing" that made her present to the ED.   In the ED her labs showed sodium 137, potassium 3.5, creatinine 0.98, high-sensitivity troponin 1930>> 5169, WBC 7.8, hemoglobin 13.4, hCG negative.  EKG showed sinus rhythm without acute ST/T wave abnormalities.  CT angio head/neck with partially empty and expanded sella which could be associated with idiopathic cranial hypertension, also rounded filling defects within the distal right transverse sinus that could represent arachnoid granulations however dural venous sinus thrombosis is difficult to exclude (recommendations for MRI with and without contrast). CT angio chest without PE or dissection. Chest x-ray negative.  Cardiology asked to evaluate.   Hospital Course     Consultants: None   Myopericarditis  - Patient presented on 2/14 complaining of chest tightness with radiation to her neck. hsTn [id number] - She underwent cardiac catheterization on 2/14 that showed normal coronary anatomy with no evidence of SCAD or thrombosis - For further evaluation, she underwent cardiac MRI on 2/15 that was consistent with myopericarditis  - Echocardiogram 2/15 showed EF 65-70%, no regional wall motion abnormalities, normal RV systolic function  - Patient is now on colchicine 0.6 mg BID, ibuprofen 800 mg TID. Reports improvement in chest pain.  - Continue ibuprofen 800 mg TID for 1 week, then decrease dose to 400 mg TID for one week. After that, patient can continue to take ibuprofen as needed.  - Continue colchicine 0.6 mg BID for 3 months - Dr. Burt Knack instructed patient to avoid strenuous activity or aerobic exercise for period of 3 months. She should have exercise testing in about 3 months prior to returning to physical exercise  - Provided instructions for radial site care and work notes. Radial cath site was stable prior to DC     NSVT  Hypokalemia  - Patient had a 20 beat run of NSVT on the evening of 2/14. Occurred in the setting of  hypokalemia. Potassium repleted (K improved to 4.3 on 2/15)  and patient was started on metoprolol tartrate 25 mg BID  - Per telemetry, patient is now in NSR. She did not have any PVCs or sustained VT for 24 hours after starting BB. Denies further episodes of palpitations  - Continue metoprolol tartrate 25 mg BID.    Headache  - Patient presented complaining of headache. Had a CT angio head that showed a partially empty and expanded sella. This is often a normal anatomic variant but can be associated with idiopathic intracranial HTN  - Patient today explains that her headache was more of a one time occurrence. Denies headache today and did not have frequent headaches prior to coming to the hospital. I do not think she needs further workup at this time. Can follow up with PCP   Patient was seen and examined by Dr. Burt Knack and deemed stable for discharge.   Patient has a follow up appointment with general cardiology on 03/19/22  Did the patient have an acute coronary syndrome (MI, NSTEMI, STEMI, etc) this admission?:  No                               Did the patient have a percutaneous coronary intervention (stent / angioplasty)?:  No.    _____________  Discharge  Vitals Blood pressure 108/65, pulse 69, temperature 98.2 F (36.8 C), temperature source Oral, resp. rate 20, height 5' 2"$  (1.575 m), weight 92.4 kg, SpO2 97 %.  Filed Weights   03/03/22 0859 03/04/22 0500  Weight: 89.8 kg 92.4 kg    Labs & Radiologic Studies    CBC Recent Labs    03/04/22 0312 03/05/22 0249  WBC 7.3 7.0  HGB 12.9 12.4  HCT 39.6 38.6  MCV 94.5 95.1  PLT 281 [id number]   Basic Metabolic Panel Recent Labs    03/03/22 1851 03/04/22 0312  NA 137 137  K 3.2* 4.3  CL 106 106  CO2 24 23  GLUCOSE 137* 111*  BUN 11 11  CREATININE 1.00 0.99  CALCIUM 8.3* 8.8*  MG 2.2 2.1   Liver Function Tests Recent Labs    03/03/22 0908  AST 29  ALT 23  ALKPHOS 59  BILITOT 0.4  PROT 6.6  ALBUMIN 3.6   No results  for input(s): "LIPASE", "AMYLASE" in the last 72 hours. High Sensitivity Troponin:   Recent Labs  Lab 03/03/22 0908 03/03/22 1243 03/04/22 0305  TROPONINIHS 1,930* 5,169* 3,561*    BNP Invalid input(s): "POCBNP" D-Dimer No results for input(s): "DDIMER" in the last 72 hours. Hemoglobin A1C Recent Labs    03/04/22 0312  HGBA1C 5.3   Fasting Lipid Panel Recent Labs    03/04/22 0312  CHOL 166  HDL 46  LDLCALC 94  TRIG 131  CHOLHDL 3.6   Thyroid Function Tests Recent Labs    03/03/22 2006  TSH 0.599   _____________  ECHOCARDIOGRAM COMPLETE  Result Date: 03/04/2022    ECHOCARDIOGRAM REPORT   Patient Name:   [name] Date of Exam: 03/04/2022 Medical Rec #:  [id number]                   Height:       62.0 in Accession #:    [id number]                  Weight:       203.7 lb Date of Birth:  01/10/1986                    BSA:          1.927 m Patient Age:    67 years                    BP:           103/63 mmHg Patient Gender: F                           HR:           65 bpm. Exam Location:  Inpatient Procedure: 2D Echo, Cardiac Doppler, Color Doppler and Strain Analysis Indications:    Chest Pain R07.9  History:        Patient has no prior history of Echocardiogram examinations.                 Previous Myocardial Infarction.  Sonographer:    Ronny Flurry Referring Phys: Cheryln Manly IMPRESSIONS  1. Left ventricular ejection fraction, by estimation, is 65 to 70%. The left ventricle has normal function. The left ventricle has no regional wall motion abnormalities. Left ventricular diastolic parameters were normal.  2. Right ventricular systolic function is normal. The right ventricular size is normal.  3. The mitral  valve is normal in structure. No evidence of mitral valve regurgitation.  4. The aortic valve is tricuspid. Aortic valve regurgitation is not visualized.  5. The inferior vena cava is normal in size with greater than 50% respiratory variability,  suggesting right atrial pressure of 3 mmHg. FINDINGS  Left Ventricle: Left ventricular ejection fraction, by estimation, is 65 to 70%. The left ventricle has normal function. The left ventricle has no regional wall motion abnormalities. The left ventricular internal cavity size was normal in size. There is  no left ventricular hypertrophy. Left ventricular diastolic parameters were normal. Right Ventricle: The right ventricular size is normal. Right vetricular wall thickness was not assessed. Right ventricular systolic function is normal. Left Atrium: Left atrial size was normal in size. Right Atrium: Right atrial size was normal in size. Pericardium: There is no evidence of pericardial effusion. Mitral Valve: The mitral valve is normal in structure. No evidence of mitral valve regurgitation. Tricuspid Valve: The tricuspid valve is normal in structure. Tricuspid valve regurgitation is trivial. Aortic Valve: The aortic valve is tricuspid. Aortic valve regurgitation is not visualized. Aortic valve mean gradient measures 4.0 mmHg. Aortic valve peak gradient measures 7.8 mmHg. Aortic valve area, by VTI measures 2.25 cm. Pulmonic Valve: The pulmonic valve was normal in structure. Pulmonic valve regurgitation is not visualized. Aorta: The aortic root and ascending aorta are structurally normal, with no evidence of dilitation. Venous: The inferior vena cava is normal in size with greater than 50% respiratory variability, suggesting right atrial pressure of 3 mmHg. IAS/Shunts: No atrial level shunt detected by color flow Doppler.  LEFT VENTRICLE PLAX 2D LVIDd:         4.70 cm   Diastology LVIDs:         3.00 cm   LV e' medial:    10.90 cm/s LV PW:         0.70 cm   LV E/e' medial:  8.1 LV IVS:        0.90 cm   LV e' lateral:   14.00 cm/s LVOT diam:     2.10 cm   LV E/e' lateral: 6.3 LV SV:         71 LV SV Index:   37 LVOT Area:     3.46 cm                           3D Volume EF:                          3D EF:        62  %                          LV EDV:       113 ml                          LV ESV:       43 ml                          LV SV:        70 ml RIGHT VENTRICLE             IVC RV S prime:     10.40 cm/s  IVC diam: 1.60 cm TAPSE (M-mode): 2.1 cm LEFT ATRIUM  Index        RIGHT ATRIUM           Index LA diam:        3.30 cm 1.71 cm/m   RA Area:     14.90 cm LA Vol (A2C):   50.1 ml 26.00 ml/m  RA Volume:   37.40 ml  19.41 ml/m LA Vol (A4C):   50.9 ml 26.42 ml/m LA Biplane Vol: 53.4 ml 27.72 ml/m  AORTIC VALVE AV Area (Vmax):    2.31 cm AV Area (Vmean):   2.22 cm AV Area (VTI):     2.25 cm AV Vmax:           140.00 cm/s AV Vmean:          93.700 cm/s AV VTI:            0.315 m AV Peak Grad:      7.8 mmHg AV Mean Grad:      4.0 mmHg LVOT Vmax:         93.30 cm/s LVOT Vmean:        59.933 cm/s LVOT VTI:          0.204 m LVOT/AV VTI ratio: 0.65  AORTA Ao Root diam: 3.20 cm Ao Asc diam:  2.90 cm MITRAL VALVE MV Area (PHT): 3.65 cm    SHUNTS MV Decel Time: 208 msec    Systemic VTI:  0.20 m MV E velocity: 88.70 cm/s  Systemic Diam: 2.10 cm MV A velocity: 71.80 cm/s MV E/A ratio:  1.24 Dorris Carnes MD Electronically signed by Dorris Carnes MD Signature Date/Time: 03/04/2022/5:22:00 PM    Final    MR CARDIAC MORPHOLOGY W WO CONTRAST  Result Date: 03/04/2022 CLINICAL DATA:  Clinical question of myocarditis Study assumes BSA of 2.01 m2. EXAM: CARDIAC MRI TECHNIQUE: The patient was scanned on a 1.5 Tesla GE magnet. A dedicated cardiac coil was used. Functional imaging was done using Fiesta sequences. 2,3, and 4 chamber views were done to assess for RWMA's. Modified Simpson's rule using a short axis stack was used to calculate an ejection fraction on a dedicated work Conservation officer, nature. The patient received 10 cc of Gadavist. After 10 minutes inversion recovery sequences were used to assess for infiltration and scar tissue. Flow quantification was performed 2 times during this examination with flow  quantification performed at the levels of the ascending aorta above the valve, pulmonary artery above the valve. CONTRAST:  10 cc  of Gadavist FINDINGS: 1. Normal left ventricular size, with LVEDD 50 mm, and LVEDVi 70 mL/m2. Normal left ventricular thickness, with intraventricular septal thickness of 8 mm, posterior wall thickness of 8 mm, and myocardial mass index of 43 g/m2. Normal left ventricular systolic function ([id number] [id number]). There are no regional wall motion abnormalities. Left ventricular parametric mapping notable for increased apical lateral T2 signal (62 ms). Suboptimal Native T1 acquisition limits ECV analysis. There is late gadolinium enhancement in the left ventricular myocardium: Transmural apical lateral LGE. 2.  Mild increase in right ventricular size with RVEDVI 88 mL/m2. Normal right ventricular thickness. Low normal right ventricular systolic function (RVEF [id number]). There are no regional wall motion abnormalities or aneurysms. 3.  Normal left and right atrial size. 4. Normal size of the aortic root, ascending aorta and pulmonary artery. 5. Valve assessment: Aortic Valve: Tri-leaflet aortic valve. There is no significant regurgitation. Regurgitant fraction < 1%. Pulmonic Valve: There is no significant regurgitation. Regurgitant fraction < 1%. Tricuspid Valve: There is mild central  regurgitation. Regurgitant fraction 7%. Mitral Valve: There is mild central regurgitation. Regurgitant fraction 13%. 6. Apical lateral pericardial thickening with pericardial hyper-enhancement. Trivial pericardial effusion. 7. Grossly, no extracardiac findings. Recommended dedicated study if concerned for non-cardiac pathology. IMPRESSION: Study meets 2018 [place] for myopericarditis. Rudean Haskell MD Electronically Signed   By: Rudean Haskell M.D.   On: 03/04/2022 13:06   MR CARDIAC VELOCITY FLOW MAP  Result Date: 03/04/2022 CLINICAL DATA:  Clinical question of myocarditis Study  assumes BSA of 2.01 m2. EXAM: CARDIAC MRI TECHNIQUE: The patient was scanned on a 1.5 Tesla GE magnet. A dedicated cardiac coil was used. Functional imaging was done using Fiesta sequences. 2,3, and 4 chamber views were done to assess for RWMA's. Modified Simpson's rule using a short axis stack was used to calculate an ejection fraction on a dedicated work Conservation officer, nature. The patient received 10 cc of Gadavist. After 10 minutes inversion recovery sequences were used to assess for infiltration and scar tissue. Flow quantification was performed 2 times during this examination with flow quantification performed at the levels of the ascending aorta above the valve, pulmonary artery above the valve. CONTRAST:  10 cc  of Gadavist FINDINGS: 1. Normal left ventricular size, with LVEDD 50 mm, and LVEDVi 70 mL/m2. Normal left ventricular thickness, with intraventricular septal thickness of 8 mm, posterior wall thickness of 8 mm, and myocardial mass index of 43 g/m2. Normal left ventricular systolic function ([id number] [id number]). There are no regional wall motion abnormalities. Left ventricular parametric mapping notable for increased apical lateral T2 signal (62 ms). Suboptimal Native T1 acquisition limits ECV analysis. There is late gadolinium enhancement in the left ventricular myocardium: Transmural apical lateral LGE. 2.  Mild increase in right ventricular size with RVEDVI 88 mL/m2. Normal right ventricular thickness. Low normal right ventricular systolic function (RVEF [id number]). There are no regional wall motion abnormalities or aneurysms. 3.  Normal left and right atrial size. 4. Normal size of the aortic root, ascending aorta and pulmonary artery. 5. Valve assessment: Aortic Valve: Tri-leaflet aortic valve. There is no significant regurgitation. Regurgitant fraction < 1%. Pulmonic Valve: There is no significant regurgitation. Regurgitant fraction < 1%. Tricuspid Valve: There is mild central regurgitation.  Regurgitant fraction 7%. Mitral Valve: There is mild central regurgitation. Regurgitant fraction 13%. 6. Apical lateral pericardial thickening with pericardial hyper-enhancement. Trivial pericardial effusion. 7. Grossly, no extracardiac findings. Recommended dedicated study if concerned for non-cardiac pathology. IMPRESSION: Study meets 2018 [place] for myopericarditis. Rudean Haskell MD Electronically Signed   By: Rudean Haskell M.D.   On: 03/04/2022 13:06   MR CARDIAC VELOCITY FLOW MAP  Result Date: 03/04/2022 CLINICAL DATA:  Clinical question of myocarditis Study assumes BSA of 2.01 m2. EXAM: CARDIAC MRI TECHNIQUE: The patient was scanned on a 1.5 Tesla GE magnet. A dedicated cardiac coil was used. Functional imaging was done using Fiesta sequences. 2,3, and 4 chamber views were done to assess for RWMA's. Modified Simpson's rule using a short axis stack was used to calculate an ejection fraction on a dedicated work Conservation officer, nature. The patient received 10 cc of Gadavist. After 10 minutes inversion recovery sequences were used to assess for infiltration and scar tissue. Flow quantification was performed 2 times during this examination with flow quantification performed at the levels of the ascending aorta above the valve, pulmonary artery above the valve. CONTRAST:  10 cc  of Gadavist FINDINGS: 1. Normal left ventricular size, with LVEDD 50  mm, and LVEDVi 70 mL/m2. Normal left ventricular thickness, with intraventricular septal thickness of 8 mm, posterior wall thickness of 8 mm, and myocardial mass index of 43 g/m2. Normal left ventricular systolic function ([id number] [id number]). There are no regional wall motion abnormalities. Left ventricular parametric mapping notable for increased apical lateral T2 signal (62 ms). Suboptimal Native T1 acquisition limits ECV analysis. There is late gadolinium enhancement in the left ventricular myocardium: Transmural apical lateral  LGE. 2.  Mild increase in right ventricular size with RVEDVI 88 mL/m2. Normal right ventricular thickness. Low normal right ventricular systolic function (RVEF [id number]). There are no regional wall motion abnormalities or aneurysms. 3.  Normal left and right atrial size. 4. Normal size of the aortic root, ascending aorta and pulmonary artery. 5. Valve assessment: Aortic Valve: Tri-leaflet aortic valve. There is no significant regurgitation. Regurgitant fraction < 1%. Pulmonic Valve: There is no significant regurgitation. Regurgitant fraction < 1%. Tricuspid Valve: There is mild central regurgitation. Regurgitant fraction 7%. Mitral Valve: There is mild central regurgitation. Regurgitant fraction 13%. 6. Apical lateral pericardial thickening with pericardial hyper-enhancement. Trivial pericardial effusion. 7. Grossly, no extracardiac findings. Recommended dedicated study if concerned for non-cardiac pathology. IMPRESSION: Study meets 2018 [place] for myopericarditis. Rudean Haskell MD Electronically Signed   By: Rudean Haskell M.D.   On: 03/04/2022 13:06   CARDIAC CATHETERIZATION  Result Date: 03/03/2022   The left ventricular systolic function is normal.   LV end diastolic pressure is mildly elevated.   The left ventricular ejection fraction is 55-65% by visual estimate. Normal coronary anatomy. No evidence of SCAD or thrombosis Normal LV function Mildly elevated LVEDP 16-19 mm Hg Plan: consider other etiologies such as myocarditis.   CT ANGIO HEAD NECK W WO CM  Result Date: 03/03/2022 CLINICAL DATA:  Vertebral artery dissection suspected r/o SAH EXAM: CT ANGIOGRAPHY HEAD AND NECK TECHNIQUE: Multidetector CT imaging of the head and neck was performed using the standard protocol during bolus administration of intravenous contrast. Multiplanar CT image reconstructions and MIPs were obtained to evaluate the vascular anatomy. Carotid stenosis measurements (when applicable) are  obtained utilizing NASCET criteria, using the distal internal carotid diameter as the denominator. RADIATION DOSE REDUCTION: This exam was performed according to the departmental dose-optimization program which includes automated exposure control, adjustment of the mA and/or kV according to patient size and/or use of iterative reconstruction technique. CONTRAST:  156m OMNIPAQUE IOHEXOL 350 MG/ML SOLN COMPARISON:  None Available. FINDINGS: CT HEAD FINDINGS Brain: No evidence of acute infarction, hemorrhage, hydrocephalus, extra-axial collection or mass lesion/mass effect. Partially empty and expanded sella. Vascular: See below. Skull: No acute fracture. Sinuses/Orbits: No acute finding. Review of the MIP images confirms the above findings CTA NECK FINDINGS Aortic arch: Patent without significant stenosis. Right carotid system: No evidence of dissection, stenosis (50% or greater), or occlusion. Left carotid system: No evidence of dissection, stenosis (50% or greater), or occlusion. Vertebral arteries: Right dominant. No evidence of dissection, stenosis (50% or greater), or occlusion. Skeleton: No acute fracture. Other neck: Subcentimeter right thyroid nodule which does not require further imaging follow-up. (ref: J Am Coll Radiol. 2015 Feb;12(2): 143-50). Upper chest: Visualized lung apices are clear. Review of the MIP images confirms the above findings CTA HEAD FINDINGS Anterior circulation: Hypoplastic right A1 ACA, likely congenital. Otherwise, bilateral intracranial ICAs, MCAs, and ACAs are patent without proximal hemodynamically significant stenosis. No aneurysm identified. Posterior circulation: Bilateral intradural vertebral arteries, basilar artery and bilateral posterior cerebral arteries are patent without proximal  hemodynamically significant stenosis. Small vertebrobasilar system with right fetal type PCA, anatomic variant. No aneurysm identified. Venous sinuses: Somewhat rounded filling defects within the  distal right transverse sinus. Otherwise, the visualized dural venous sinuses are patent. Anatomic variants: Detailed above. Review of the MIP images confirms the above findings IMPRESSION: CT head: 1. No evidence of acute intracranial abnormality. 2. Partially empty and expanded sella, which is often a normal anatomic variant but can be associated with idiopathic intracranial hypertension (pseudotumor cerebri). CTA: 1. No large vessel occlusion, proximal hemodynamically significant stenosis, or evidence of dissection. 2. Somewhat rounded filling defects within the distal right transverse sinus. These could potentially represent arachnoid granulations; however, dural venous sinus thrombosis is difficult to exclude. An MRI with and without contrast could further assess if clinically warranted. Electronically Signed   By: Margaretha Sheffield M.D.   On: 03/03/2022 13:12   CT Angio Chest/Abd/Pel for Dissection W and/or Wo Contrast  Result Date: 03/03/2022 CLINICAL DATA:  Concern for acute aortic dissection. EXAM: CT ANGIOGRAPHY CHEST, ABDOMEN AND PELVIS TECHNIQUE: Non-contrast CT of the chest was initially obtained. Multidetector CT imaging through the chest, abdomen and pelvis was performed using the standard protocol during bolus administration of intravenous contrast. Multiplanar reconstructed images and MIPs were obtained and reviewed to evaluate the vascular anatomy. RADIATION DOSE REDUCTION: This exam was performed according to the departmental dose-optimization program which includes automated exposure control, adjustment of the mA and/or kV according to patient size and/or use of iterative reconstruction technique. CONTRAST:  125m OMNIPAQUE IOHEXOL 350 MG/ML SOLN COMPARISON:  Ultrasound March 06, 2019 and July 08, 2017. FINDINGS: CTA CHEST FINDINGS Cardiovascular: Noncontrast enhanced portion of the examination demonstrates minimal aortic atherosclerosis without intramural hematoma. Preferential  opacification of the thoracic aorta post contrast injection without evidence of thoracic aortic aneurysm or dissection. Three-vessel arch. No central pulmonary embolus on this nondedicated study. Normal heart size. No significant pericardial effusion/thickening. Mediastinum/Nodes: No suspicious thyroid nodule. No pathologically enlarged mediastinal, hilar or axillary lymph nodes. The esophagus is grossly unremarkable. Lungs/Pleura: Hypoventilatory change in the dependent lungs. No suspicious pulmonary nodules or masses. No pleural effusion. No pneumothorax. Musculoskeletal: No chest wall abnormality. No acute or significant osseous findings. Review of the MIP images confirms the above findings. CTA ABDOMEN AND PELVIS FINDINGS VASCULAR Aorta: Normal caliber aorta without aneurysm, dissection, vasculitis or significant stenosis. Celiac: Patent without evidence of aneurysm, dissection, vasculitis or significant stenosis. SMA: Patent without evidence of aneurysm, dissection, vasculitis or significant stenosis. Renals: Both renal arteries are patent without evidence of aneurysm, dissection, vasculitis, fibromuscular dysplasia or significant stenosis. IMA: Patent without evidence of aneurysm, dissection, vasculitis or significant stenosis. Inflow: Patent without evidence of aneurysm, dissection, vasculitis or significant stenosis. Veins: No obvious venous abnormality within the limitations of this arterial phase study. Review of the MIP images confirms the above findings. NON-VASCULAR Hepatobiliary: Cyst in the right lobe of the liver. Gallbladder is unremarkable. No biliary ductal dilation. Pancreas: No pancreatic ductal dilation or evidence of acute inflammation. Spleen: No splenomegaly. Adrenals/Urinary Tract: Bilateral adrenal glands appear normal. Bilateral hypodense renal lesions are technically too small to accurately characterize but statistically likely reflect cysts and considered benign requiring no independent  imaging follow-up. Urinary bladder is unremarkable for degree of distension. Stomach/Bowel: Stomach is unremarkable for degree of distension. No pathologic dilation of small or large bowel. No evidence of acute bowel inflammation. Normal appendix and terminal ileum. Lymphatic: No pathologically enlarged abdominal or pelvic lymph nodes. Reproductive: Enlarged uterus with lobular uterine contour. No suspicious  adnexal mass. Other: No significant abdominopelvic free fluid. Musculoskeletal: No acute osseous abnormality. Review of the MIP images confirms the above findings. IMPRESSION: 1. No evidence of aortic aneurysm or dissection. 2. No acute abnormality in the chest, abdomen, or pelvis. 3. Enlarged uterus with lobular uterine contour may reflect leiomyomas. Electronically Signed   By: Dahlia Bailiff M.D.   On: 03/03/2022 12:57   DG Chest Port 1 View  Result Date: 03/03/2022 CLINICAL DATA:  Chest pain. EXAM: PORTABLE CHEST 1 VIEW COMPARISON:  None Available. FINDINGS: Low lung volumes. No consolidation. No visible pleural effusions or pneumothorax. Cardiomediastinal silhouette is within normal limits for technique. IMPRESSION: No active disease. Electronically Signed   By: Margaretha Sheffield M.D.   On: 03/03/2022 09:47   Disposition   Pt is being discharged home today in good condition.  Follow-up Plans & Appointments     Follow-up Information     Barbarann Ehlers Junius Creamer., NP Follow up on 03/19/2022.   Specialty: Cardiology Why: Appointment at 10:55 AM Contact information: [address] [place] [address] [place] [address] [phone]                Discharge Instructions     AMB referral to Phase II Cardiac Rehabilitation   Complete by: As directed    Diagnosis: NSTEMI   After initial evaluation and assessments completed: Virtual Based Care may be provided alone or in conjunction with Phase 2 Cardiac Rehab based on patient barriers.: Yes   Intensive Cardiac Rehabilitation (ICR) [place]  location only OR Traditional Cardiac Rehabilitation (TCR) *If criteria for ICR are not met will enroll in TCR [facility] only): Yes   Call MD for:  extreme fatigue   Complete by: As directed    Call MD for:  persistant dizziness or light-headedness   Complete by: As directed    Call MD for:  redness, tenderness, or signs of infection (pain, swelling, redness, odor or green/yellow discharge around incision site)   Complete by: As directed    Call MD for:  severe uncontrolled pain   Complete by: As directed    Call MD for:  temperature >100.4   Complete by: As directed    Diet - low sodium heart healthy   Complete by: As directed    Discharge instructions   Complete by: As directed    Radial Site Care Refer to this sheet in the next few weeks. These instructions provide you with information on caring for yourself after your procedure. Your caregiver may also give you more specific instructions. Your treatment has been planned according to current medical practices, but problems sometimes occur. Call your caregiver if you have any problems or questions after your procedure. HOME CARE INSTRUCTIONS You may shower the day after the procedure. Remove the bandage (dressing) and gently wash the site with plain soap and water. Gently pat the site dry.  Do not apply powder or lotion to the site.  Do not submerge the affected site in water for 3 to 5 days.  Inspect the site at least twice daily.  Do not flex or bend the affected arm for 24 hours.  No lifting over 5 pounds (2.3 kg) for 5 days after your procedure.  Do not drive home if you are discharged the same day of the procedure. Have someone else drive you.  You may drive 24 hours after the procedure unless otherwise instructed by your caregiver.  What to expect: Any bruising will usually fade within 1 to 2  weeks.  Blood that collects in the tissue (hematoma) may be painful to the touch. It should usually decrease in size and tenderness within 1 to 2  weeks.  SEEK IMMEDIATE MEDICAL CARE IF: You have unusual pain at the radial site.  You have redness, warmth, swelling, or pain at the radial site.  You have drainage (other than a small amount of blood on the dressing).  You have chills.  You have a fever or persistent symptoms for more than 72 hours.  You have a fever and your symptoms suddenly get worse.  Your arm becomes pale, cool, tingly, or numb.  You have heavy bleeding from the site. Hold pressure on the site.   Increase activity slowly   Complete by: As directed         Discharge Medications   Allergies as of 03/05/2022   No Known Allergies      Medication List     TAKE these medications    acetaminophen 500 MG tablet Commonly known as: TYLENOL Take 1,000 mg by mouth daily as needed for mild pain, moderate pain, fever or headache.   colchicine 0.6 MG tablet Take 1 tablet (0.6 mg total) by mouth 2 (two) times daily.   fluticasone 50 MCG/ACT nasal spray Commonly known as: FLONASE Place 2 sprays into both nostrils daily as needed for allergies.   ibuprofen 400 MG tablet Commonly known as: ADVIL Take 2 tablets (800 mg) three times daily for 7 days. Then, take 1 tablet (400 mg) three times daily for 7 days   metoprolol tartrate 25 MG tablet Commonly known as: LOPRESSOR Take 1 tablet (25 mg total) by mouth 2 (two) times daily.   multivitamin tablet Take 1 tablet by mouth daily.           Outstanding Labs/Studies     Duration of Discharge Encounter   Greater than 30 minutes including physician time.  Signed, Margie Billet, PA-C 03/05/2022, 11:24 AM

## 2022-03-05 NOTE — Progress Notes (Addendum)
Rounding Note    Patient Name: Jamie Ortega Date of Encounter: 03/05/2022  [place] Cardiologist: Sherren Mocha, MD   Subjective   Patient reports that she is doing well this AM. Denies chest pain, palpitations. Feels ready to go home today   Inpatient Medications    Scheduled Meds:  colchicine  0.6 mg Oral BID   heparin  5,000 Units Subcutaneous Q8H   ibuprofen  800 mg Oral TID   metoprolol tartrate  25 mg Oral BID   sodium chloride flush  3 mL Intravenous Q12H   sodium chloride flush  3 mL Intravenous Q12H   Continuous Infusions:  sodium chloride     sodium chloride     PRN Meds: sodium chloride, sodium chloride, acetaminophen, alum & mag hydroxide-simeth, nitroGLYCERIN, ondansetron (ZOFRAN) IV, sodium chloride flush, sodium chloride flush   Vital Signs    Vitals:   03/04/22 1257 03/04/22 2010 03/04/22 2119 03/05/22 0356  BP: 103/63 106/79  103/67  Pulse: 60 (!) 54 70 (!) 58  Resp: 18 18  18  $ Temp: 98.9 F (37.2 C) 98.1 F (36.7 C)  98.1 F (36.7 C)  TempSrc: Oral Oral  Oral  SpO2: 97% 98%  97%  Weight:      Height:        Intake/Output Summary (Last 24 hours) at 03/05/2022 0754 Last data filed at 03/04/2022 0813 Gross per 24 hour  Intake 3 ml  Output --  Net 3 ml      03/04/2022    5:00 AM 03/03/2022    8:59 AM 08/10/2021    9:28 AM  Last 3 Weights  Weight (lbs) 203 lb 11.2 oz 198 lb 218 lb  Weight (kg) 92.398 kg 89.812 kg 98.884 kg      Telemetry    NSR, HR in the 50s-60s - Personally Reviewed  ECG    Sinus rhythm with PACs, HR 68 BPM  - Personally Reviewed  Physical Exam   GEN: No acute distress. Laying flat in the bed. Asleep but arouses easily to voice  Neck: No JVD when laying flat  Cardiac: RRR, no murmurs, rubs, or gallops. Radial pulses 2+ bilaterally  Respiratory: Anterior lung exam clear to auscultation bilaterally. Normal WOB on room air  GI: Soft, nontender, non-distended  MS: No edema in BLE. No  deformity. Neuro:  Nonfocal  Psych: Normal affect   Labs    High Sensitivity Troponin:   Recent Labs  Lab 03/03/22 0908 03/03/22 1243 03/04/22 0305  TROPONINIHS 1,930* 5,169* 3,561*     Chemistry Recent Labs  Lab 03/03/22 0908 03/03/22 1833 03/03/22 1851 03/04/22 0312  NA 137  --  137 137  K 3.5  --  3.2* 4.3  CL 109  --  106 106  CO2 22  --  24 23  GLUCOSE 80  --  137* 111*  BUN 14  --  11 11  CREATININE 0.98 1.00 1.00 0.99  CALCIUM 8.7*  --  8.3* 8.8*  MG  --   --  2.2 2.1  PROT 6.6  --   --   --   ALBUMIN 3.6  --   --   --   AST 29  --   --   --   ALT 23  --   --   --   ALKPHOS 59  --   --   --   BILITOT 0.4  --   --   --   GFRNONAA >60 >60 >  60 >60  ANIONGAP 6  --  7 8    Lipids  Recent Labs  Lab 03/04/22 0312  CHOL 166  TRIG 131  HDL 46  LDLCALC 94  CHOLHDL 3.6    Hematology Recent Labs  Lab 03/03/22 1833 03/04/22 0312 03/05/22 0249  WBC 8.0 7.3 7.0  RBC 4.04 4.19 4.06  HGB 12.5 12.9 12.4  HCT 37.3 39.6 38.6  MCV 92.3 94.5 95.1  MCH 30.9 30.8 30.5  MCHC 33.5 32.6 32.1  RDW 14.3 14.5 14.5  PLT 269 281 270   Thyroid  Recent Labs  Lab 03/03/22 2006  TSH 0.599    BNPNo results for input(s): "BNP", "PROBNP" in the last 168 hours.  DDimer No results for input(s): "DDIMER" in the last 168 hours.   Radiology    ECHOCARDIOGRAM COMPLETE  Result Date: 03/04/2022    ECHOCARDIOGRAM REPORT   Patient Name:   Jamie Ortega Date of Exam: 03/04/2022 Medical Rec #:  [id number]                   Height:       62.0 in Accession #:    [id number]                  Weight:       203.7 lb Date of Birth:  September 11, 1985                    BSA:          1.927 m Patient Age:    37 years                    BP:           103/63 mmHg Patient Gender: F                           HR:           65 bpm. Exam Location:  Inpatient Procedure: 2D Echo, Cardiac Doppler, Color Doppler and Strain Analysis Indications:    Chest Pain R07.9  History:        Patient has no  prior history of Echocardiogram examinations.                 Previous Myocardial Infarction.  Sonographer:    Ronny Flurry Referring Phys: Cheryln Manly IMPRESSIONS  1. Left ventricular ejection fraction, by estimation, is 65 to 70%. The left ventricle has normal function. The left ventricle has no regional wall motion abnormalities. Left ventricular diastolic parameters were normal.  2. Right ventricular systolic function is normal. The right ventricular size is normal.  3. The mitral valve is normal in structure. No evidence of mitral valve regurgitation.  4. The aortic valve is tricuspid. Aortic valve regurgitation is not visualized.  5. The inferior vena cava is normal in size with greater than 50% respiratory variability, suggesting right atrial pressure of 3 mmHg. FINDINGS  Left Ventricle: Left ventricular ejection fraction, by estimation, is 65 to 70%. The left ventricle has normal function. The left ventricle has no regional wall motion abnormalities. The left ventricular internal cavity size was normal in size. There is  no left ventricular hypertrophy. Left ventricular diastolic parameters were normal. Right Ventricle: The right ventricular size is normal. Right vetricular wall thickness was not assessed. Right ventricular systolic function is normal. Left Atrium: Left atrial size was normal in size. Right Atrium: Right  atrial size was normal in size. Pericardium: There is no evidence of pericardial effusion. Mitral Valve: The mitral valve is normal in structure. No evidence of mitral valve regurgitation. Tricuspid Valve: The tricuspid valve is normal in structure. Tricuspid valve regurgitation is trivial. Aortic Valve: The aortic valve is tricuspid. Aortic valve regurgitation is not visualized. Aortic valve mean gradient measures 4.0 mmHg. Aortic valve peak gradient measures 7.8 mmHg. Aortic valve area, by VTI measures 2.25 cm. Pulmonic Valve: The pulmonic valve was normal in structure. Pulmonic  valve regurgitation is not visualized. Aorta: The aortic root and ascending aorta are structurally normal, with no evidence of dilitation. Venous: The inferior vena cava is normal in size with greater than 50% respiratory variability, suggesting right atrial pressure of 3 mmHg. IAS/Shunts: No atrial level shunt detected by color flow Doppler.  LEFT VENTRICLE PLAX 2D LVIDd:         4.70 cm   Diastology LVIDs:         3.00 cm   LV e' medial:    10.90 cm/s LV PW:         0.70 cm   LV E/e' medial:  8.1 LV IVS:        0.90 cm   LV e' lateral:   14.00 cm/s LVOT diam:     2.10 cm   LV E/e' lateral: 6.3 LV SV:         71 LV SV Index:   37 LVOT Area:     3.46 cm                           3D Volume EF:                          3D EF:        62 %                          LV EDV:       113 ml                          LV ESV:       43 ml                          LV SV:        70 ml RIGHT VENTRICLE             IVC RV S prime:     10.40 cm/s  IVC diam: 1.60 cm TAPSE (M-mode): 2.1 cm LEFT ATRIUM             Index        RIGHT ATRIUM           Index LA diam:        3.30 cm 1.71 cm/m   RA Area:     14.90 cm LA Vol (A2C):   50.1 ml 26.00 ml/m  RA Volume:   37.40 ml  19.41 ml/m LA Vol (A4C):   50.9 ml 26.42 ml/m LA Biplane Vol: 53.4 ml 27.72 ml/m  AORTIC VALVE AV Area (Vmax):    2.31 cm AV Area (Vmean):   2.22 cm AV Area (VTI):     2.25 cm AV Vmax:           140.00 cm/s AV Vmean:  93.700 cm/s AV VTI:            0.315 m AV Peak Grad:      7.8 mmHg AV Mean Grad:      4.0 mmHg LVOT Vmax:         93.30 cm/s LVOT Vmean:        59.933 cm/s LVOT VTI:          0.204 m LVOT/AV VTI ratio: 0.65  AORTA Ao Root diam: 3.20 cm Ao Asc diam:  2.90 cm MITRAL VALVE MV Area (PHT): 3.65 cm    SHUNTS MV Decel Time: 208 msec    Systemic VTI:  0.20 m MV E velocity: 88.70 cm/s  Systemic Diam: 2.10 cm MV A velocity: 71.80 cm/s MV E/A ratio:  1.24 Dorris Carnes MD Electronically signed by Dorris Carnes MD Signature Date/Time: 03/04/2022/5:22:00 PM     Final    MR CARDIAC MORPHOLOGY W WO CONTRAST  Result Date: 03/04/2022 CLINICAL DATA:  Clinical question of myocarditis Study assumes BSA of 2.01 m2. EXAM: CARDIAC MRI TECHNIQUE: The patient was scanned on a 1.5 Tesla GE magnet. A dedicated cardiac coil was used. Functional imaging was done using Fiesta sequences. 2,3, and 4 chamber views were done to assess for RWMA's. Modified Simpson's rule using a short axis stack was used to calculate an ejection fraction on a dedicated work Conservation officer, nature. The patient received 10 cc of Gadavist. After 10 minutes inversion recovery sequences were used to assess for infiltration and scar tissue. Flow quantification was performed 2 times during this examination with flow quantification performed at the levels of the ascending aorta above the valve, pulmonary artery above the valve. CONTRAST:  10 cc  of Gadavist FINDINGS: 1. Normal left ventricular size, with LVEDD 50 mm, and LVEDVi 70 mL/m2. Normal left ventricular thickness, with intraventricular septal thickness of 8 mm, posterior wall thickness of 8 mm, and myocardial mass index of 43 g/m2. Normal left ventricular systolic function ([id number] [id number]). There are no regional wall motion abnormalities. Left ventricular parametric mapping notable for increased apical lateral T2 signal (62 ms). Suboptimal Native T1 acquisition limits ECV analysis. There is late gadolinium enhancement in the left ventricular myocardium: Transmural apical lateral LGE. 2.  Mild increase in right ventricular size with RVEDVI 88 mL/m2. Normal right ventricular thickness. Low normal right ventricular systolic function (RVEF [id number]). There are no regional wall motion abnormalities or aneurysms. 3.  Normal left and right atrial size. 4. Normal size of the aortic root, ascending aorta and pulmonary artery. 5. Valve assessment: Aortic Valve: Tri-leaflet aortic valve. There is no significant regurgitation. Regurgitant fraction < 1%. Pulmonic Valve:  There is no significant regurgitation. Regurgitant fraction < 1%. Tricuspid Valve: There is mild central regurgitation. Regurgitant fraction 7%. Mitral Valve: There is mild central regurgitation. Regurgitant fraction 13%. 6. Apical lateral pericardial thickening with pericardial hyper-enhancement. Trivial pericardial effusion. 7. Grossly, no extracardiac findings. Recommended dedicated study if concerned for non-cardiac pathology. IMPRESSION: Study meets 2018 [place] for myopericarditis. Rudean Haskell MD Electronically Signed   By: Rudean Haskell M.D.   On: 03/04/2022 13:06   MR CARDIAC VELOCITY FLOW MAP  Result Date: 03/04/2022 CLINICAL DATA:  Clinical question of myocarditis Study assumes BSA of 2.01 m2. EXAM: CARDIAC MRI TECHNIQUE: The patient was scanned on a 1.5 Tesla GE magnet. A dedicated cardiac coil was used. Functional imaging was done using Fiesta sequences. 2,3, and 4 chamber views were done to assess for RWMA's. Modified Simpson's  rule using a short axis stack was used to calculate an ejection fraction on a dedicated work Conservation officer, nature. The patient received 10 cc of Gadavist. After 10 minutes inversion recovery sequences were used to assess for infiltration and scar tissue. Flow quantification was performed 2 times during this examination with flow quantification performed at the levels of the ascending aorta above the valve, pulmonary artery above the valve. CONTRAST:  10 cc  of Gadavist FINDINGS: 1. Normal left ventricular size, with LVEDD 50 mm, and LVEDVi 70 mL/m2. Normal left ventricular thickness, with intraventricular septal thickness of 8 mm, posterior wall thickness of 8 mm, and myocardial mass index of 43 g/m2. Normal left ventricular systolic function ([id number] [id number]). There are no regional wall motion abnormalities. Left ventricular parametric mapping notable for increased apical lateral T2 signal (62 ms). Suboptimal Native T1 acquisition  limits ECV analysis. There is late gadolinium enhancement in the left ventricular myocardium: Transmural apical lateral LGE. 2.  Mild increase in right ventricular size with RVEDVI 88 mL/m2. Normal right ventricular thickness. Low normal right ventricular systolic function (RVEF [id number]). There are no regional wall motion abnormalities or aneurysms. 3.  Normal left and right atrial size. 4. Normal size of the aortic root, ascending aorta and pulmonary artery. 5. Valve assessment: Aortic Valve: Tri-leaflet aortic valve. There is no significant regurgitation. Regurgitant fraction < 1%. Pulmonic Valve: There is no significant regurgitation. Regurgitant fraction < 1%. Tricuspid Valve: There is mild central regurgitation. Regurgitant fraction 7%. Mitral Valve: There is mild central regurgitation. Regurgitant fraction 13%. 6. Apical lateral pericardial thickening with pericardial hyper-enhancement. Trivial pericardial effusion. 7. Grossly, no extracardiac findings. Recommended dedicated study if concerned for non-cardiac pathology. IMPRESSION: Study meets 2018 [place] for myopericarditis. Rudean Haskell MD Electronically Signed   By: Rudean Haskell M.D.   On: 03/04/2022 13:06   MR CARDIAC VELOCITY FLOW MAP  Result Date: 03/04/2022 CLINICAL DATA:  Clinical question of myocarditis Study assumes BSA of 2.01 m2. EXAM: CARDIAC MRI TECHNIQUE: The patient was scanned on a 1.5 Tesla GE magnet. A dedicated cardiac coil was used. Functional imaging was done using Fiesta sequences. 2,3, and 4 chamber views were done to assess for RWMA's. Modified Simpson's rule using a short axis stack was used to calculate an ejection fraction on a dedicated work Conservation officer, nature. The patient received 10 cc of Gadavist. After 10 minutes inversion recovery sequences were used to assess for infiltration and scar tissue. Flow quantification was performed 2 times during this examination with flow  quantification performed at the levels of the ascending aorta above the valve, pulmonary artery above the valve. CONTRAST:  10 cc  of Gadavist FINDINGS: 1. Normal left ventricular size, with LVEDD 50 mm, and LVEDVi 70 mL/m2. Normal left ventricular thickness, with intraventricular septal thickness of 8 mm, posterior wall thickness of 8 mm, and myocardial mass index of 43 g/m2. Normal left ventricular systolic function ([id number] [id number]). There are no regional wall motion abnormalities. Left ventricular parametric mapping notable for increased apical lateral T2 signal (62 ms). Suboptimal Native T1 acquisition limits ECV analysis. There is late gadolinium enhancement in the left ventricular myocardium: Transmural apical lateral LGE. 2.  Mild increase in right ventricular size with RVEDVI 88 mL/m2. Normal right ventricular thickness. Low normal right ventricular systolic function (RVEF [id number]). There are no regional wall motion abnormalities or aneurysms. 3.  Normal left and right atrial size. 4. Normal size of the aortic root, ascending aorta and pulmonary artery.  5. Valve assessment: Aortic Valve: Tri-leaflet aortic valve. There is no significant regurgitation. Regurgitant fraction < 1%. Pulmonic Valve: There is no significant regurgitation. Regurgitant fraction < 1%. Tricuspid Valve: There is mild central regurgitation. Regurgitant fraction 7%. Mitral Valve: There is mild central regurgitation. Regurgitant fraction 13%. 6. Apical lateral pericardial thickening with pericardial hyper-enhancement. Trivial pericardial effusion. 7. Grossly, no extracardiac findings. Recommended dedicated study if concerned for non-cardiac pathology. IMPRESSION: Study meets 2018 [place] for myopericarditis. Rudean Haskell MD Electronically Signed   By: Rudean Haskell M.D.   On: 03/04/2022 13:06   CARDIAC CATHETERIZATION  Result Date: 03/03/2022   The left ventricular systolic function is normal.   LV end  diastolic pressure is mildly elevated.   The left ventricular ejection fraction is 55-65% by visual estimate. Normal coronary anatomy. No evidence of SCAD or thrombosis Normal LV function Mildly elevated LVEDP 16-19 mm Hg Plan: consider other etiologies such as myocarditis.   CT ANGIO HEAD NECK W WO CM  Result Date: 03/03/2022 CLINICAL DATA:  Vertebral artery dissection suspected r/o SAH EXAM: CT ANGIOGRAPHY HEAD AND NECK TECHNIQUE: Multidetector CT imaging of the head and neck was performed using the standard protocol during bolus administration of intravenous contrast. Multiplanar CT image reconstructions and MIPs were obtained to evaluate the vascular anatomy. Carotid stenosis measurements (when applicable) are obtained utilizing NASCET criteria, using the distal internal carotid diameter as the denominator. RADIATION DOSE REDUCTION: This exam was performed according to the departmental dose-optimization program which includes automated exposure control, adjustment of the mA and/or kV according to patient size and/or use of iterative reconstruction technique. CONTRAST:  168m OMNIPAQUE IOHEXOL 350 MG/ML SOLN COMPARISON:  None Available. FINDINGS: CT HEAD FINDINGS Brain: No evidence of acute infarction, hemorrhage, hydrocephalus, extra-axial collection or mass lesion/mass effect. Partially empty and expanded sella. Vascular: See below. Skull: No acute fracture. Sinuses/Orbits: No acute finding. Review of the MIP images confirms the above findings CTA NECK FINDINGS Aortic arch: Patent without significant stenosis. Right carotid system: No evidence of dissection, stenosis (50% or greater), or occlusion. Left carotid system: No evidence of dissection, stenosis (50% or greater), or occlusion. Vertebral arteries: Right dominant. No evidence of dissection, stenosis (50% or greater), or occlusion. Skeleton: No acute fracture. Other neck: Subcentimeter right thyroid nodule which does not require further imaging  follow-up. (ref: J Am Coll Radiol. 2015 Feb;12(2): 143-50). Upper chest: Visualized lung apices are clear. Review of the MIP images confirms the above findings CTA HEAD FINDINGS Anterior circulation: Hypoplastic right A1 ACA, likely congenital. Otherwise, bilateral intracranial ICAs, MCAs, and ACAs are patent without proximal hemodynamically significant stenosis. No aneurysm identified. Posterior circulation: Bilateral intradural vertebral arteries, basilar artery and bilateral posterior cerebral arteries are patent without proximal hemodynamically significant stenosis. Small vertebrobasilar system with right fetal type PCA, anatomic variant. No aneurysm identified. Venous sinuses: Somewhat rounded filling defects within the distal right transverse sinus. Otherwise, the visualized dural venous sinuses are patent. Anatomic variants: Detailed above. Review of the MIP images confirms the above findings IMPRESSION: CT head: 1. No evidence of acute intracranial abnormality. 2. Partially empty and expanded sella, which is often a normal anatomic variant but can be associated with idiopathic intracranial hypertension (pseudotumor cerebri). CTA: 1. No large vessel occlusion, proximal hemodynamically significant stenosis, or evidence of dissection. 2. Somewhat rounded filling defects within the distal right transverse sinus. These could potentially represent arachnoid granulations; however, dural venous sinus thrombosis is difficult to exclude. An MRI with and without contrast could further  assess if clinically warranted. Electronically Signed   By: Margaretha Sheffield M.D.   On: 03/03/2022 13:12   CT Angio Chest/Abd/Pel for Dissection W and/or Wo Contrast  Result Date: 03/03/2022 CLINICAL DATA:  Concern for acute aortic dissection. EXAM: CT ANGIOGRAPHY CHEST, ABDOMEN AND PELVIS TECHNIQUE: Non-contrast CT of the chest was initially obtained. Multidetector CT imaging through the chest, abdomen and pelvis was performed using  the standard protocol during bolus administration of intravenous contrast. Multiplanar reconstructed images and MIPs were obtained and reviewed to evaluate the vascular anatomy. RADIATION DOSE REDUCTION: This exam was performed according to the departmental dose-optimization program which includes automated exposure control, adjustment of the mA and/or kV according to patient size and/or use of iterative reconstruction technique. CONTRAST:  174m OMNIPAQUE IOHEXOL 350 MG/ML SOLN COMPARISON:  Ultrasound March 06, 2019 and July 08, 2017. FINDINGS: CTA CHEST FINDINGS Cardiovascular: Noncontrast enhanced portion of the examination demonstrates minimal aortic atherosclerosis without intramural hematoma. Preferential opacification of the thoracic aorta post contrast injection without evidence of thoracic aortic aneurysm or dissection. Three-vessel arch. No central pulmonary embolus on this nondedicated study. Normal heart size. No significant pericardial effusion/thickening. Mediastinum/Nodes: No suspicious thyroid nodule. No pathologically enlarged mediastinal, hilar or axillary lymph nodes. The esophagus is grossly unremarkable. Lungs/Pleura: Hypoventilatory change in the dependent lungs. No suspicious pulmonary nodules or masses. No pleural effusion. No pneumothorax. Musculoskeletal: No chest wall abnormality. No acute or significant osseous findings. Review of the MIP images confirms the above findings. CTA ABDOMEN AND PELVIS FINDINGS VASCULAR Aorta: Normal caliber aorta without aneurysm, dissection, vasculitis or significant stenosis. Celiac: Patent without evidence of aneurysm, dissection, vasculitis or significant stenosis. SMA: Patent without evidence of aneurysm, dissection, vasculitis or significant stenosis. Renals: Both renal arteries are patent without evidence of aneurysm, dissection, vasculitis, fibromuscular dysplasia or significant stenosis. IMA: Patent without evidence of aneurysm, dissection,  vasculitis or significant stenosis. Inflow: Patent without evidence of aneurysm, dissection, vasculitis or significant stenosis. Veins: No obvious venous abnormality within the limitations of this arterial phase study. Review of the MIP images confirms the above findings. NON-VASCULAR Hepatobiliary: Cyst in the right lobe of the liver. Gallbladder is unremarkable. No biliary ductal dilation. Pancreas: No pancreatic ductal dilation or evidence of acute inflammation. Spleen: No splenomegaly. Adrenals/Urinary Tract: Bilateral adrenal glands appear normal. Bilateral hypodense renal lesions are technically too small to accurately characterize but statistically likely reflect cysts and considered benign requiring no independent imaging follow-up. Urinary bladder is unremarkable for degree of distension. Stomach/Bowel: Stomach is unremarkable for degree of distension. No pathologic dilation of small or large bowel. No evidence of acute bowel inflammation. Normal appendix and terminal ileum. Lymphatic: No pathologically enlarged abdominal or pelvic lymph nodes. Reproductive: Enlarged uterus with lobular uterine contour. No suspicious adnexal mass. Other: No significant abdominopelvic free fluid. Musculoskeletal: No acute osseous abnormality. Review of the MIP images confirms the above findings. IMPRESSION: 1. No evidence of aortic aneurysm or dissection. 2. No acute abnormality in the chest, abdomen, or pelvis. 3. Enlarged uterus with lobular uterine contour may reflect leiomyomas. Electronically Signed   By: JDahlia BailiffM.D.   On: 03/03/2022 12:57   DG Chest Port 1 View  Result Date: 03/03/2022 CLINICAL DATA:  Chest pain. EXAM: PORTABLE CHEST 1 VIEW COMPARISON:  None Available. FINDINGS: Low lung volumes. No consolidation. No visible pleural effusions or pneumothorax. Cardiomediastinal silhouette is within normal limits for technique. IMPRESSION: No active disease. Electronically Signed   By: FMargaretha SheffieldM.D.    On: 03/03/2022 09:47  Cardiac Studies   Left Heart Catheterization 03/03/22   The left ventricular systolic function is normal.   LV end diastolic pressure is mildly elevated.   The left ventricular ejection fraction is 55-65% by visual estimate.   Normal coronary anatomy. No evidence of SCAD or thrombosis Normal LV function Mildly elevated LVEDP 16-19 mm Hg   Plan: consider other etiologies such as myocarditis.  Cardiac MRI 03/04/22 1. Normal left ventricular size, with LVEDD 50 mm, and LVEDVi 70 mL/m2.   Normal left ventricular thickness, with intraventricular septal thickness of 8 mm, posterior wall thickness of 8 mm, and myocardial mass index of 43 g/m2.   Normal left ventricular systolic function ([id number] [id number]). There are no regional wall motion abnormalities.   Left ventricular parametric mapping notable for increased apical lateral T2 signal (62 ms).   Suboptimal Native T1 acquisition limits ECV analysis.   There is late gadolinium enhancement in the left ventricular myocardium: Transmural apical lateral LGE.   2.  Mild increase in right ventricular size with RVEDVI 88 mL/m2.   Normal right ventricular thickness.   Low normal right ventricular systolic function (RVEF [id number]). There are no regional wall motion abnormalities or aneurysms.   3.  Normal left and right atrial size.   4. Normal size of the aortic root, ascending aorta and pulmonary artery.   5. Valve assessment:   Aortic Valve: Tri-leaflet aortic valve. There is no significant regurgitation. Regurgitant fraction < 1%.   Pulmonic Valve: There is no significant regurgitation. Regurgitant fraction < 1%.   Tricuspid Valve: There is mild central regurgitation. Regurgitant fraction 7%.   Mitral Valve: There is mild central regurgitation. Regurgitant fraction 13%.   6. Apical lateral pericardial thickening with pericardial hyper-enhancement. Trivial pericardial effusion.   7. Grossly, no  extracardiac findings. Recommended dedicated study if concerned for non-cardiac pathology.   IMPRESSION: Study meets 2018 [place] for myopericarditis.  Echocardiogram 03/04/22  1. Left ventricular ejection fraction, by estimation, is 65 to 70%. The  left ventricle has normal function. The left ventricle has no regional  wall motion abnormalities. Left ventricular diastolic parameters were  normal.   2. Right ventricular systolic function is normal. The right ventricular  size is normal.   3. The mitral valve is normal in structure. No evidence of mitral valve  regurgitation.   4. The aortic valve is tricuspid. Aortic valve regurgitation is not  visualized.   5. The inferior vena cava is normal in size with greater than 50%  respiratory variability, suggesting right atrial pressure of 3 mmHg.    Patient Profile     37 y.o. female with no reported PMH who was first seen by cardiology 03/03/2022 for the evaluation of chest pain and headache.    Assessment & Plan    Myopericarditis  - Patient presented on 2/14 complaining of chest tightness with radiation to her neck. hsTin [id number] - She underwent cardiac catheterization on 2/14 that showed normal coronary anatomy with no evidence of SCAD or thrombosis - For further evaluation, she underwent cardiac MRI on 2/15 that was consistent with myopericarditis  - Echocardiogram 2/15 showed EF 65-70%, no regional wall motion abnormalities, normal RV systolic function  - Patient is now on colchicine 0.6 mg BID, ibuprofen 800 mg TID. Reports improvement in chest pain.  - Continue ibuprofen 800 mg TID for 1 week, then decrease dose to 400 mg TID for one week. After that, patient can continue to take ibuprofen as needed.  - Continue  colchicine 0.6 mg BID for 3 months  NSVT  - Patient had a 20 beat run of NSVT on the evening 9of 2/14. Occurred in the setting of hypokalemia. Potassium repleted (K was 4.3 on 2/15)  and patient  was started on metoprolol tartrate 25 mg BID  - Per telemetry, patient is now in NSR. Denies further episodes of palpitations   Headache  - Patient presented complaining of headache. Had a CT angio head that showed a partially empty and expanded sella. This is often a normal anatomic variant but can be associated with idiopathic intracranial HTN  - Patient today explains that her headache was more of a one time occurrence. Denies headache today and did not have frequent headaches prior to coming to the hospital. I do not think she needs further workup at this time. Can follow up with PCP   For questions or updates, please contact [place] Please consult [email or web address] for contact info under        Signed, Margie Billet, PA-C  03/05/2022, 7:54 AM    Patient seen, examined. Available data reviewed. Agree with findings, assessment, and plan as outlined by Vikki Ports, PA-C.  The patient is independently interviewed and examined.  Her husband is at the bedside.  She is awake and alert, no distress.  Lungs are clear to auscultation bilaterally, JVP is normal, carotid upstrokes normal without bruits, heart is regular rate and rhythm with no murmur gallop, abdomen is soft and nontender, extremities have no edema, right radial cath site is clear.  The patient's MRI is reviewed and she does meet criteria for myopericarditis.  Her troponin is now trending down.  She is having no further chest discomfort.  Her LVEF is normal.  I reviewed her telemetry and she has had no PVCs or nonsustained VT over the last 24 hours since being started on a beta-blocker.  I think the patient is medically stable for discharge from the hospital today.  I agree with the treatment plan as outlined above using a combination of ibuprofen and colchicine.  I advised her of my recommendation to avoid strenuous activity or aerobic exercise for period of 3 months.  She should have exercise testing in about 3 months  prior to returning to physical exercise.  She requests a note since she paid for her high intensity training for 1 year and advanced as she thinks she will be able to defer this for the 76-monthperiod when she is restricted. Pt stable for DC today.   MSherren Mocha M.D. 03/05/2022 11:00 AM

## 2022-03-06 LAB — LIPOPROTEIN A (LPA): Lipoprotein (a): 29.8 nmol/L

## 2022-03-08 ENCOUNTER — Telehealth: Payer: Self-pay | Admitting: *Deleted

## 2022-03-08 NOTE — Transitions of Care (Post Inpatient/ED Visit) (Signed)
   03/08/2022  Name: Jamie Ortega MRN: [id number] DOB: 1985/06/26  Today's TOC FU Call Status: Today's TOC FU Call Status:: Successful TOC FU Call Competed TOC FU Call Complete Date: 03/08/22  Transition Care Management Follow-up Telephone Call Date of Discharge: 03/05/22 Discharge Facility: [facility]) Type of Discharge: Inpatient Admission Primary Inpatient Discharge Diagnosis:: acute myocarditis How have you been since you were released from the hospital?: Better Any questions or concerns?: Yes Patient Questions/Concerns:: Patient is having up to 5 stools a day with the mediaation Patient Questions/Concerns Addressed: Notified Provider of Patient Questions/Concerns (patient will caontact the provider for medication change)  Items Reviewed: Did you receive and understand the discharge instructions provided?: Yes Medications obtained and verified?: Yes (Medications Reviewed) Any new allergies since your discharge?: No Dietary orders reviewed?: No Do you have support at home?: Yes People in Home: spouse Name of Support/Comfort Primary Source: [place] and Equipment/Supplies: [place] Ordered?: No Any new equipment or medical supplies ordered?: No  Functional Questionnaire: Do you need assistance with bathing/showering or dressing?: No Do you need assistance with meal preparation?: No Do you need assistance with eating?: No Do you have difficulty maintaining continence: No Do you need assistance with getting out of bed/getting out of a chair/moving?: No  Folllow up appointments reviewed: PCP Follow-up appointment confirmed?: Yes Date of PCP follow-up appointment?: 03/09/22 Follow-up Provider: Alma Friendly NP [facility] Follow-up appointment confirmed?: Yes Date of Specialist follow-up appointment?: 03/19/22 Follow-Up Specialty Provider:: Jannet Mantis NP cardiology 10:55 Do you need transportation to your follow-up  appointment?: No Do you understand care options if your condition(s) worsen?: Yes-patient verbalized understanding     [facility] [phone]

## 2022-03-08 NOTE — Progress Notes (Signed)
  Care Coordination  Note  03/08/2022 Name: Jamie Ortega MRN: [id number] DOB: July 17, 1985  Jamie Ortega is a 37 y.o. year old primary care patient of Pleas Koch, NP. Follow up plan: Hospital Follow Up appointment scheduled with Pleas Koch, NPon 03/09/22 at 3:00 PM.  [place]  Direct Dial: [phone]

## 2022-03-09 ENCOUNTER — Ambulatory Visit: Payer: BC Managed Care – PPO | Admitting: Primary Care

## 2022-03-09 ENCOUNTER — Encounter: Payer: Self-pay | Admitting: Primary Care

## 2022-03-09 VITALS — BP 116/80 | HR 59 | Temp 97.7°F | Ht 62.0 in | Wt 206.0 lb

## 2022-03-09 DIAGNOSIS — I309 Acute pericarditis, unspecified: Secondary | ICD-10-CM

## 2022-03-09 NOTE — Progress Notes (Signed)
Subjective:    Patient ID: Jamie Ortega, female    DOB: 02-Aug-1985, 37 y.o.   MRN: [id number]  HPI  Lizmarie Biermann is a very pleasant 37 y.o. female with a history of acute myopericarditis, NSVT, NSTEMI who presents today for hospital follow up.   She presetned to [facility] on 03/03/22 for a 3 hour history of severe, throbbing, pulsating frontal headache with nausea, chills, and weakness. Her headache then moved towards her neck and chest for another 15 minutes.   During her stay in the ED she underwent labs which revealed troponin level of 1900 with normal ECG. She underwent CT head which was negative. She underwent CTA head, neck with dissection study of chest/abdomen/pelvis which was negative for dissection, but CTA head was suggestive of idiopathic intracranial hypertensoin. Troponin levels increased several hours later to 5169. Cardiology consulted who recommended admission.   During her hospital stay she was treated for NSTEMI with heparin drip. She underwent cardiac catheterization which was negative. She underwent cardiac MRI which revealed myopericarditis. She underwent echocardiogram which was within normal range. She did require potassium and initiation of metoprolol tartrate 25 mg BID for NSVT and palpitations. She was treated with colchicine 0.6 mg BID, Ibuprofen 800 mg TID with improvement in chest pain. She was discharged home on 03/05/22 with instructions to continue Ibuporfen 800 mg TID x 1 week, then decrease to 400 mg TID x 1 week, then PRN. Colchicine 0.6 mg BID was continued for a total of 3 months.   Today she mentions that she was "sick" in late January and early February 2024. 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 She is compliant to colchicine 0.6 mg BID. She originally experienced diarrhea with colchicine, this has improved. She continues to experience palpitations which are intermittent with exertion.  She continues to feel fatigued.   She is needing intermittent FMLA for her hospital stay February 14th through February 19th. She is also needing intermittent FMLA for fatigue and doctors appointments. Intermittent FMLA is needed for February 14th through May 13th 2024.  Review of Systems  Constitutional:  Positive for fatigue.  Respiratory:  Negative for shortness of breath.   Cardiovascular:  Positive for palpitations. Negative for chest pain.  Neurological:  Negative for dizziness and headaches.         Past Medical History:  Diagnosis Date   Back pain affecting pregnancy in third trimester 02/10/2018   Genital herpes    Labor and delivery, indication for care 05/02/2018    Social History   Socioeconomic History   Marital status: Married    Spouse name: Not on file   Number of children: 2   Years of education: Not on file   Highest education level: Not on file  Occupational History   Not on file  Tobacco Use   Smoking status: Never   Smokeless tobacco: Never  Vaping Use   Vaping Use: Never used  Substance and Sexual Activity   Alcohol use: Not Currently    Comment: Socially   Drug use: No   Sexual activity: Not Currently    Partners: Male    Birth control/protection: None  Other Topics Concern   Not on file  Social History Narrative   Married.   2 children.    Works at [employer] [place].    Enjoys kayaking, spending time outdoors.    Social Determinants of Health   Financial Resource Strain:  Not on file  Food Insecurity: No Food Insecurity (03/04/2022)   Hunger Vital Sign    Worried About Running Out of Food in the Last Year: Never true    Ran Out of Food in the Last Year: Never true  Transportation Needs: No Transportation Needs (03/04/2022)   PRAPARE - Hydrologist (Medical): No    Lack of Transportation (Non-Medical): No  Physical Activity: Not on file  Stress: Not on file  Social Connections: Not on file  Intimate Partner  Violence: Not At Risk (03/04/2022)   Humiliation, Afraid, Rape, and Kick questionnaire    Fear of Current or Ex-Partner: No    Emotionally Abused: No    Physically Abused: No    Sexually Abused: No    Past Surgical History:  Procedure Laterality Date   LEFT HEART CATH AND CORONARY ANGIOGRAPHY N/A 03/03/2022   Procedure: LEFT HEART CATH AND CORONARY ANGIOGRAPHY;  Surgeon: Martinique, Peter M, MD;  Location: [facility];  Service: Cardiovascular;  Laterality: N/A;   WISDOM TOOTH EXTRACTION      Family History  Problem Relation Age of Onset   Cervical cancer Mother    Endometriosis Mother    Arthritis Mother    Neuropathy Mother    Hypertension Mother    Anxiety disorder Mother    Post-traumatic stress disorder Mother    Hyperthyroidism Mother    Hyperlipidemia Father    Rheum arthritis Maternal Grandmother    COPD Maternal Grandmother    Emphysema Maternal Grandmother    Prostate cancer Maternal Grandfather    Cancer Paternal Grandfather        Jaw   Ovarian cancer Neg Hx    Colon cancer Neg Hx    Breast cancer Neg Hx     No Known Allergies  Current Outpatient Medications on File Prior to Visit  Medication Sig Dispense Refill   acetaminophen (TYLENOL) 500 MG tablet Take 1,000 mg by mouth daily as needed for mild pain, moderate pain, fever or headache.     colchicine 0.6 MG tablet Take 1 tablet (0.6 mg total) by mouth 2 (two) times daily. 180 tablet 0   fluticasone (FLONASE) 50 MCG/ACT nasal spray Place 2 sprays into both nostrils daily as needed for allergies.     ibuprofen (ADVIL) 400 MG tablet Take 2 tablets (800 mg) three times daily for 7 days. Then, take 1 tablet (400 mg) three times daily for 7 days 63 tablet 0   metoprolol tartrate (LOPRESSOR) 25 MG tablet Take 1 tablet (25 mg total) by mouth 2 (two) times daily. 180 tablet 3   Multiple Vitamin (MULTIVITAMIN) tablet Take 1 tablet by mouth daily.     No current facility-administered medications on file prior to  visit.    BP 116/80   Pulse (!) 59   Temp 97.7 F (36.5 C) (Temporal)   Ht 5' 2"$  (1.575 m)   Wt 206 lb (93.4 kg)   LMP 03/07/2022 (Exact Date)   SpO2 95%   BMI 37.68 kg/m  Objective:   Physical Exam Constitutional:      Appearance: She is not ill-appearing.  Cardiovascular:     Rate and Rhythm: Normal rate and regular rhythm.  Pulmonary:     Effort: Pulmonary effort is normal.     Breath sounds: Normal breath sounds.  Musculoskeletal:     Cervical back: Neck supple.  Skin:    General: Skin is warm and dry.  Neurological:  Mental Status: She is oriented to person, place, and time.           Assessment & Plan:  Acute myopericarditis Assessment & Plan: Recent hospital stay. Reviewed hospital labs, imaging, notes.  She appears well today. Discussed that fatigue could be a combination of her recent hospital stay and the metoprolol. Discussed to cut metoprolol in half to 12.5 mg BID.  Continue colchicine 0.6 mg BID. Continue Ibuprofen TID PRN.  Agree to complete FMLA paperwork for hospital stay and to use intermittently for next 3 months.  Follow up with cardiology as scheduled.          Pleas Koch, NP

## 2022-03-09 NOTE — Assessment & Plan Note (Signed)
Recent hospital stay. Reviewed hospital labs, imaging, notes.  She appears well today. Discussed that fatigue could be a combination of her recent hospital stay and the metoprolol. Discussed to cut metoprolol in half to 12.5 mg BID.  Continue colchicine 0.6 mg BID. Continue Ibuprofen TID PRN.  Agree to complete FMLA paperwork for hospital stay and to use intermittently for next 3 months.  Follow up with cardiology as scheduled.

## 2022-03-12 ENCOUNTER — Telehealth: Payer: Self-pay | Admitting: Primary Care

## 2022-03-12 NOTE — Telephone Encounter (Signed)
Patient notified as instructed by telephone and verbalized understanding. Patient stated that she has not requested that the paperwork be sent here yet. Patient stated she wanted to check and make sure that Jamie Ortega would complete it. Patient stated that she will notify her employer to send it over.

## 2022-03-12 NOTE — Telephone Encounter (Signed)
That should be okay. Jamie Ortega, not sure if we've received anything from her employer as of yet.

## 2022-03-12 NOTE — Telephone Encounter (Signed)
Patient spoke with Anda Kraft about signing a intermittent leave for 3 months. She would like to know if Anda Kraft will sign off on a full leave,due to her symptoms,because she's using up too much of her PTO,And she would ike to know about taking a full leave.

## 2022-03-15 NOTE — Telephone Encounter (Signed)
Patient came by and dropped off form,placed in [employer] box.She would like to be faxed to the number at top of form.

## 2022-03-15 NOTE — Telephone Encounter (Signed)
We received patient's FMLA form.  This has been placed in your inbox for completion and signing.

## 2022-03-17 NOTE — Telephone Encounter (Signed)
Completed and placed on Kate's desk. 

## 2022-03-17 NOTE — Progress Notes (Signed)
Office Visit    Patient Name: Jamie Ortega Date of Encounter: 03/19/2022  Primary Care Provider:  Pleas Koch, NP Primary Cardiologist:  Sherren Mocha, MD Primary Electrophysiologist: None  Chief Complaint    Jamie Ortega is a 37 y.o. female with PMH of NSTEMI, NSVT, Myopericarditis who presents for posthospital follow-up.  Past Medical History    Past Medical History:  Diagnosis Date   Back pain affecting pregnancy in third trimester 02/10/2018   Genital herpes    Labor and delivery, indication for care 05/02/2018   Past Surgical History:  Procedure Laterality Date   LEFT HEART CATH AND CORONARY ANGIOGRAPHY N/A 03/03/2022   Procedure: LEFT HEART CATH AND CORONARY ANGIOGRAPHY;  Surgeon: Martinique, Peter M, MD;  Location: [facility];  Service: Cardiovascular;  Laterality: N/A;   WISDOM TOOTH EXTRACTION      Allergies  No Known Allergies  History of Present Illness    Jamie Ortega  is a 37 year old female with the above mention past medical history who presents today for post hospital follow-up of myopericarditis.  Jamie Ortega presented to the ED on 03/03/22 with sudden onset of headache and chest pain.  She described headache as throbbing and pulsating and chest pain spreads towards her neck.  EKG was completed showing sinus rhythm and troponins completed that were elevated at 1930>> 5169.  CT angio chest without PE or dissection. Chest x-ray negative.  Cardiology was asked to evaluate and patient was admitted for further evaluation.  Patient underwent left heart cath for NSTEMI that revealed normal coronaries with no evidence of scad or thrombosis.  LV function was also noted to be normal at 55-65%.  The next morning she continued to report improvement but lingering chest squeezing.  She developed a run of NSVT 20 beats and was treated with metoprolol 25 mg twice daily.  Potassium was drawn and was 3.2 and patient was given K-Dur for 40  mEq x 1.  Cardiac MRI was completed on 2/15 that showed normal LV thickness with late gadolinium enhancement in the LV myocardium and diagnosis of myopericarditis was made.  She was started on colchicine 0.6 mg 3 times daily for 3 months.  She was started on ibuprofen 800 mg 3 times daily for 1 week and then decrease to 40 mg 3 times daily for 1 week with as needed following discontinuation in 1 week.  Jamie Ortega presents today for posthospital follow-up of acute myopericarditis.  Since last being seen in the office patient reports that she is doing much better and has less chest discomfort.  She does however report more frequent palpitations but no sustained palpitations similar to her occurrence in the hospital.  She has been compliant with her medications and denies any adverse reactions.  She reports occasional strong beats that cause some sharp discomfort but is not radiating to her arms or chest.  She is currently exercising and was interested in knowing if she could resume yoga. I advised her to continue low impact exercises and avoid high impact plyometric type exercises.  We discussed the need to avoid triggers for palpitations such as caffeine, alcohol, and stress.  She currently works at [employer] and states that her job is very stressful but she will work on avoiding stress when she returns. Patient denies chest pain, palpitations, dyspnea, PND, orthopnea, nausea, vomiting, dizziness, syncope, edema, weight gain, or early satiety.  Home Medications    Current Outpatient Medications  Medication Sig Dispense Refill   acetaminophen (TYLENOL) 500 MG tablet Take 1,000 mg by mouth daily as needed for mild pain, moderate pain, fever or headache.     colchicine 0.6 MG tablet Take 1 tablet (0.6 mg total) by mouth 2 (two) times daily. 180 tablet 0   fluticasone (FLONASE) 50 MCG/ACT nasal spray Place 2 sprays into both nostrils daily as needed for allergies.     ibuprofen (ADVIL) 400 MG tablet  Take 2 tablets (800 mg) three times daily for 7 days. Then, take 1 tablet (400 mg) three times daily for 7 days 63 tablet 0   metoprolol tartrate (LOPRESSOR) 25 MG tablet Take 1 tablet (25 mg total) by mouth 2 (two) times daily. 180 tablet 3   Multiple Vitamin (MULTIVITAMIN) tablet Take 1 tablet by mouth daily.     No current facility-administered medications for this visit.     Review of Systems  Please see the history of present illness.    (+) Palpitations (+) Sharp chest pain All other systems reviewed and are otherwise negative except as noted above.  Physical Exam    Wt Readings from Last 3 Encounters:  03/19/22 206 lb 3.2 oz (93.5 kg)  03/09/22 206 lb (93.4 kg)  03/04/22 203 lb 11.2 oz (92.4 kg)   VS: Vitals:   03/19/22 1116  BP: 118/80  Pulse: 65  SpO2: 100%  ,Body mass index is 37.71 kg/m.  Constitutional:      Appearance: Healthy appearance. Not in distress.  Neck:     Vascular: JVD normal.  Pulmonary:     Effort: Pulmonary effort is normal.     Breath sounds: No wheezing. No rales. Diminished in the bases Cardiovascular:     Normal rate. Regular rhythm. Normal S1. Normal S2.      Murmurs: There is no murmur.  Edema:    Peripheral edema absent.  Abdominal:     Palpations: Abdomen is soft non tender. There is no hepatomegaly.  Skin:    General: Skin is warm and dry.  Neurological:     General: No focal deficit present.     Mental Status: Alert and oriented to person, place and time.     Cranial Nerves: Cranial nerves are intact.  EKG/LABS/Other Studies Reviewed    ECG personally reviewed by me today -none completed today   Lab Results  Component Value Date   WBC 7.0 03/05/2022   HGB 12.4 03/05/2022   HCT 38.6 03/05/2022   MCV 95.1 03/05/2022   PLT 270 03/05/2022   Lab Results  Component Value Date   CREATININE 0.99 03/04/2022   BUN 11 03/04/2022   NA 137 03/04/2022   K 4.3 03/04/2022   CL 106 03/04/2022   CO2 23 03/04/2022   Lab Results   Component Value Date   ALT 23 03/03/2022   AST 29 03/03/2022   ALKPHOS 59 03/03/2022   BILITOT 0.4 03/03/2022   Lab Results  Component Value Date   CHOL 166 03/04/2022   HDL 46 03/04/2022   LDLCALC 94 03/04/2022   TRIG 131 03/04/2022   CHOLHDL 3.6 03/04/2022    Lab Results  Component Value Date   HGBA1C 5.3 03/04/2022    Assessment & Plan    1.  Myopericarditis: -Patient presented to the ED with throbbing headache and chest pain with elevated troponins.  She underwent LHC that revealed no evidence of SCAD.  Cardiac MRI was completed and showed evidence of late gadolinium enhancement with verification of myopericarditis. -  She was started on colchicine 0.6 mg for 3 months and ibuprofen. -Today patient reports some improvement to her chest pain but does report occasional sharp pain that occurs with palpitations. -She will take her ibuprofen twice daily instead of 3 times a day and we will have her resume 800 mg 3 times a day for 1 week.  2.  Nonsustained SVT: -Patient developed palpitations and chest pain with 20 beat run of NSVT on telemetry.  She was treated with metoprolol 25 mg twice daily with no recurrence. -Today patient reports recurrence of palpitations that occur with and without activity and have a heavy sensation in her chest. -We discussed avoiding triggers such as caffeine and alcohol -She will wear ZIO monitor for 14 days to evaluate for arrhythmia and rule out tachycardia.  3.  Obesity: -Patient's BMI is 37.71 kg/m and she is currently in the process of losing weight and exercising. -She is motivated to follow a heart healthy diet and continues to exercise greater than 150 minutes/week.   Disposition: Follow-up with Sherren Mocha, MD or APP in 1 months    Medication Adjustments/Labs and Tests Ordered: Current medicines are reviewed at length with the patient today.  Concerns regarding medicines are outlined above.   Signed, Mable Fill, Marissa Nestle,  NP 03/19/2022, 11:28 AM [facility]  Note:  This document was prepared using Dragon voice recognition software and may include unintentional dictation errors.

## 2022-03-18 NOTE — Telephone Encounter (Signed)
Patient's copy placed in front office for pickup.  Sent msg via mychart that it's ready to be picked up.

## 2022-03-18 NOTE — Telephone Encounter (Signed)
Completed form has been faxed to fax#[phone].  Received fax confirmation.  Copies made for scanning, patient, and myself.  Sent msg via mychart to see how patient would like to get her copy.

## 2022-03-19 ENCOUNTER — Ambulatory Visit (INDEPENDENT_AMBULATORY_CARE_PROVIDER_SITE_OTHER): Payer: BC Managed Care – PPO

## 2022-03-19 ENCOUNTER — Encounter: Payer: Self-pay | Admitting: Nurse Practitioner

## 2022-03-19 ENCOUNTER — Ambulatory Visit: Payer: BC Managed Care – PPO | Attending: Nurse Practitioner | Admitting: Nurse Practitioner

## 2022-03-19 VITALS — BP 118/80 | HR 65 | Ht 62.0 in | Wt 206.2 lb

## 2022-03-19 DIAGNOSIS — I4729 Other ventricular tachycardia: Secondary | ICD-10-CM

## 2022-03-19 DIAGNOSIS — I309 Acute pericarditis, unspecified: Secondary | ICD-10-CM

## 2022-03-19 DIAGNOSIS — E669 Obesity, unspecified: Secondary | ICD-10-CM

## 2022-03-19 MED ORDER — METOPROLOL TARTRATE 25 MG PO TABS: 25.0000 mg | ORAL_TABLET | Freq: Two times a day (BID) | ORAL | 0 refills | Status: DC

## 2022-03-19 NOTE — Progress Notes (Unsigned)
Enrolled patient for a 14 day Zio XT monitor to be mailed to patients home   Dr Burt Knack to read

## 2022-03-19 NOTE — Patient Instructions (Signed)
Medication Instructions:  START Ibuprofen '400mg'$  Take 1 tablet three times a day for 1 week then take on a as needed basis CAN take an additional tablet of Metoprolol Tartrate as needed for palpitations. *If you need a refill on your cardiac medications before your next appointment, please call your pharmacy*   Lab Work: None Ordered If you have labs (blood work) drawn today and your tests are completely normal, you will receive your results only by: [place] (if you have MyChart) OR A paper copy in the mail If you have any lab test that is abnormal or we need to change your treatment, we will call you to review the results.   Testing/Procedures: [facility]- Long Term Monitor Instructions  Your physician has requested you wear a ZIO patch monitor for 14 days.  This is a single patch monitor. [employer] supplies one patch monitor per enrollment. Additional stickers are not available. Please do not apply patch if you will be having a Nuclear Stress Test,  Echocardiogram, Cardiac CT, MRI, or Chest Xray during the period you would be wearing the  monitor. The patch cannot be worn during these tests. You cannot remove and re-apply the  ZIO XT patch monitor.  Your ZIO patch monitor will be mailed 3 day USPS to your address on file. It may take 3-5 days  to receive your monitor after you have been enrolled.  Once you have received your monitor, please review the enclosed instructions. Your monitor  has already been registered assigning a specific monitor serial # to you.  Billing and Patient Assistance Program Information  We have supplied [employer] with any of your insurance information on file for billing purposes. [employer] offers a sliding scale Patient Assistance Program for patients that do not have  insurance, or whose insurance does not completely cover the cost of the ZIO monitor.  You must apply for the Patient Assistance Program to qualify for this discounted rate.  To apply, please  call [employer] at [phone], select option 4, select option 2, ask to apply for  Patient Assistance Program. Theodore Demark will ask your household income, and how many people  are in your household. They will quote your out-of-pocket cost based on that information.  [employer] will also be able to set up a 21-month interest-free payment plan if needed.  Applying the monitor   Shave hair from upper left chest.  Hold abrader disc by orange tab. Rub abrader in 40 strokes over the upper left chest as  indicated in your monitor instructions.  Clean area with 4 enclosed alcohol pads. Let dry.  Apply patch as indicated in monitor instructions. Patch will be placed under collarbone on left  side of chest with arrow pointing upward.  Rub patch adhesive wings for 2 minutes. Remove white label marked "1". Remove the white  label marked "2". Rub patch adhesive wings for 2 additional minutes.  While looking in a mirror, press and release button in center of patch. A small green light will  flash 3-4 times. This will be your only indicator that the monitor has been turned on.  Do not shower for the first 24 hours. You may shower after the first 24 hours.  Press the button if you feel a symptom. You will hear a small click. Record Date, Time and  Symptom in the Patient Logbook.  When you are ready to remove the patch, follow instructions on the last 2 pages of Patient  Logbook. Stick patch monitor onto the last  page of Patient Logbook.  Place Patient Logbook in the blue and white box. Use locking tab on box and tape box closed  securely. The blue and white box has prepaid postage on it. Please place it in the mailbox as  soon as possible. Your physician should have your test results approximately 7 days after the  monitor has been mailed back to [facility].  Call [facility] at [phone] if you have questions regarding  your ZIO XT patch monitor. Call them immediately if you see an  orange light blinking on your  monitor.  If your monitor falls off in less than 4 days, contact our Monitor department at [phone].  If your monitor becomes loose or falls off after 4 days call [employer] at [phone] for  suggestions on securing your monitor    Follow-Up: At [facility], you and your health needs are our priority.  As part of our continuing mission to provide you with exceptional heart care, we have created designated Provider Care Teams.  These Care Teams include your primary Cardiologist (physician) and Advanced Practice Providers (APPs -  Physician Assistants and Nurse Practitioners) who all work together to provide you with the care you need, when you need it.  We recommend signing up for the patient portal called "MyChart".  Sign up information is provided on this After Visit Summary.  MyChart is used to connect with patients for Virtual Visits (Telemedicine).  Patients are able to view lab/test results, encounter notes, upcoming appointments, etc.  Non-urgent messages can be sent to your provider as well.   To learn more about what you can do with MyChart, go to [email or web address].    Your next appointment:   1 month(s)  Provider:   Ambrose Pancoast, NP       Other Instructions Return to work note given.

## 2022-04-05 ENCOUNTER — Telehealth: Payer: Self-pay

## 2022-04-05 NOTE — Telephone Encounter (Signed)
-----   Message from Marylu Lund., NP sent at 04/05/2022 10:02 AM EDT ----- Please let Ms. Aronov know that her event monitor showed an overall rhythm of sinus which is normal.  There were rare episodes of extra beats in the top and bottom portion of your heart with no sustained extra beats like you experience in the hospital.  There were 2 episodes of slow heartbeat that occurred during sleeping hours.  The triggered events that were made during monitoring were related to normal rhythm.  Please continue your current medications and let us know if you have any additional questions.  Ambrose Pancoast, NP

## 2022-04-05 NOTE — Telephone Encounter (Addendum)
Called and left a voice message for patient to give office a call back for results.  ----- Message from Marylu Lund., NP sent at 04/05/2022 10:02 AM EDT ----- Please let Ms. Volcy know that her event monitor showed an overall rhythm of sinus which is normal.  There were rare episodes of extra beats in the top and bottom portion of your heart with no sustained extra beats like you experience in the hospital.  There were 2 episodes of slow heartbeat that occurred during sleeping hours.  The triggered events that were made during monitoring were related to normal rhythm.  Please continue your current medications and let us know if you have any additional questions.  Ambrose Pancoast, NP

## 2022-04-06 ENCOUNTER — Encounter: Payer: Self-pay | Admitting: Cardiovascular Disease

## 2022-04-06 NOTE — Telephone Encounter (Signed)
Error

## 2022-04-06 NOTE — Telephone Encounter (Signed)
Pt is returning call in regards to results. Requesting return call.  

## 2022-04-06 NOTE — Telephone Encounter (Signed)
Patient is aware of results and provider recommendations. Verbalized understanding 

## 2022-05-03 NOTE — Progress Notes (Deleted)
Office Visit    Patient Name: Jamie Ortega Date of Encounter: 05/03/2022  Primary Care Provider:  Doreene Nest, NP Primary Cardiologist:  Tonny Bollman, MD Primary Electrophysiologist: None  Chief Complaint    Jamie Ortega is a 37 y.o. female with PMH of NSTEMI, NSVT, Myopericarditis who presents for 1 month follow-up.  Past Medical History    Past Medical History:  Diagnosis Date   Back pain affecting pregnancy in third trimester 02/10/2018   Genital herpes    Labor and delivery, indication for care 05/02/2018   Past Surgical History:  Procedure Laterality Date   LEFT HEART CATH AND CORONARY ANGIOGRAPHY N/A 03/03/2022   Procedure: LEFT HEART CATH AND CORONARY ANGIOGRAPHY;  Surgeon: [place], Peter M, MD;  Location: [facility] INVASIVE CV LAB;  Service: Cardiovascular;  Laterality: N/A;   WISDOM TOOTH EXTRACTION      Allergies  No Known Allergies  History of Present Illness    Jamie Ortega  is a 37 year old female with the above mention past medical history who presents today for 1 month follow-up. Ms. Thoreson presented to the ED on 03/03/22 with sudden onset of headache and chest pain.  She described headache as throbbing and pulsating and chest pain spreads towards her neck.  EKG was completed showing sinus rhythm and troponins completed that were elevated at 1930>> [id number].  CT angio chest without PE or dissection. Chest x-ray negative.  Cardiology was asked to evaluate and patient was admitted for further evaluation.  Patient underwent left heart cath for NSTEMI that revealed normal coronaries with no evidence of scad or thrombosis.  LV function was also noted to be normal at 55-65%.  She was seen in follow-up on 04/07/2022 and was doing much better with less chest discomfort.  She did however report more frequent palpitations but reported no sustained arrhythmias.      Since last being seen in the office patient reports***.  Patient denies chest  pain, palpitations, dyspnea, PND, orthopnea, nausea, vomiting, dizziness, syncope, edema, weight gain, or early satiety.     ***Notes:  Home Medications    Current Outpatient Medications  Medication Sig Dispense Refill   acetaminophen (TYLENOL) 500 MG tablet Take 1,000 mg by mouth daily as needed for mild pain, moderate pain, fever or headache.     colchicine 0.6 MG tablet Take 1 tablet (0.6 mg total) by mouth 2 (two) times daily. 180 tablet 0   fluticasone (FLONASE) 50 MCG/ACT nasal spray Place 2 sprays into both nostrils daily as needed for allergies.     ibuprofen (ADVIL) 400 MG tablet Take 2 tablets (800 mg) three times daily for 7 days. Then, take 1 tablet (400 mg) three times daily for 7 days 63 tablet 0   metoprolol tartrate (LOPRESSOR) 25 MG tablet Take 1 tablet (25 mg total) by mouth 2 (two) times daily. Can take an additional tablet as needed for palpitations 200 tablet 0   Multiple Vitamin (MULTIVITAMIN) tablet Take 1 tablet by mouth daily.     No current facility-administered medications for this visit.     Review of Systems  Please see the history of present illness.    (+)*** (+)***  All other systems reviewed and are otherwise negative except as noted above.  Physical Exam    Wt Readings from Last 3 Encounters:  03/19/22 206 lb 3.2 oz (93.5 kg)  03/09/22 206 lb (93.4 kg)  03/04/22 203 lb 11.2 oz (92.4 kg)   ZO:[id number] were  no vitals filed for this visit.,There is no height or weight on file to calculate BMI.  Constitutional:      Appearance: Healthy appearance. Not in distress.  Neck:     Vascular: JVD normal.  Pulmonary:     Effort: Pulmonary effort is normal.     Breath sounds: No wheezing. No rales. Diminished in the bases Cardiovascular:     Normal rate. Regular rhythm. Normal S1. Normal S2.      Murmurs: There is no murmur.  Edema:    Peripheral edema absent.  Abdominal:     Palpations: Abdomen is soft non tender. There is no hepatomegaly.  Skin:     General: Skin is warm and dry.  Neurological:     General: No focal deficit present.     Mental Status: Alert and oriented to person, place and time.     Cranial Nerves: Cranial nerves are intact.  EKG/LABS/ Recent Cardiac Studies    ECG personally reviewed by me today - ***  Risk Assessment/Calculations:   {Does this patient have ATRIAL FIBRILLATION?:[phone]}        Lab Results  Component Value Date   WBC 7.0 03/05/2022   HGB 12.4 03/05/2022   HCT 38.6 03/05/2022   MCV 95.1 03/05/2022   PLT 270 03/05/2022   Lab Results  Component Value Date   CREATININE 0.99 03/04/2022   BUN 11 03/04/2022   NA 137 03/04/2022   K 4.3 03/04/2022   CL 106 03/04/2022   CO2 23 03/04/2022   Lab Results  Component Value Date   ALT 23 03/03/2022   AST 29 03/03/2022   ALKPHOS 59 03/03/2022   BILITOT 0.4 03/03/2022   Lab Results  Component Value Date   CHOL 166 03/04/2022   HDL 46 03/04/2022   LDLCALC 94 03/04/2022   TRIG 131 03/04/2022   CHOLHDL 3.6 03/04/2022    Lab Results  Component Value Date   HGBA1C 5.3 03/04/2022    Cardiac Studies & Procedures   CARDIAC CATHETERIZATION  CARDIAC CATHETERIZATION 03/03/2022  Narrative   The left ventricular systolic function is normal.   LV end diastolic pressure is mildly elevated.   The left ventricular ejection fraction is 55-65% by visual estimate.  Normal coronary anatomy. No evidence of SCAD or thrombosis Normal LV function Mildly elevated LVEDP 16-19 mm Hg  Plan: consider other etiologies such as myocarditis.  Findings Coronary Findings Diagnostic  Dominance: Right  Left Main Vessel was injected. Vessel is normal in caliber. Vessel is angiographically normal.  Left Anterior Descending Vessel was injected. Vessel is normal in caliber. Vessel is angiographically normal.  Left Circumflex Vessel was injected. Vessel is normal in caliber. Vessel is angiographically normal.  Right Coronary Artery Vessel was injected.  Vessel is normal in caliber. Vessel is angiographically normal.  Intervention  No interventions have been documented.     ECHOCARDIOGRAM  ECHOCARDIOGRAM COMPLETE 03/04/2022  Narrative ECHOCARDIOGRAM REPORT    Patient Name:   Jamie Ortega Date of Exam: 03/04/2022 Medical Rec #:  [id number]                   Height:       62.0 in Accession #:    [id number]                  Weight:       203.7 lb Date of Birth:  December 16, 1985  BSA:          1.927 m Patient Age:    36 years                    BP:           103/63 mmHg Patient Gender: F                           HR:           65 bpm. Exam Location:  Inpatient  Procedure: 2D Echo, Cardiac Doppler, Color Doppler and Strain Analysis  Indications:    Chest Pain R07.9  History:        Patient has no prior history of Echocardiogram examinations. Previous Myocardial Infarction.  Sonographer:    Lucendia Herrlich Referring Phys: Arty Baumgartner  IMPRESSIONS   1. Left ventricular ejection fraction, by estimation, is 65 to 70%. The left ventricle has normal function. The left ventricle has no regional wall motion abnormalities. Left ventricular diastolic parameters were normal. 2. Right ventricular systolic function is normal. The right ventricular size is normal. 3. The mitral valve is normal in structure. No evidence of mitral valve regurgitation. 4. The aortic valve is tricuspid. Aortic valve regurgitation is not visualized. 5. The inferior vena cava is normal in size with greater than 50% respiratory variability, suggesting right atrial pressure of 3 mmHg.  FINDINGS Left Ventricle: Left ventricular ejection fraction, by estimation, is 65 to 70%. The left ventricle has normal function. The left ventricle has no regional wall motion abnormalities. The left ventricular internal cavity size was normal in size. There is no left ventricular hypertrophy. Left ventricular diastolic parameters were normal.  Right  Ventricle: The right ventricular size is normal. Right vetricular wall thickness was not assessed. Right ventricular systolic function is normal.  Left Atrium: Left atrial size was normal in size.  Right Atrium: Right atrial size was normal in size.  Pericardium: There is no evidence of pericardial effusion.  Mitral Valve: The mitral valve is normal in structure. No evidence of mitral valve regurgitation.  Tricuspid Valve: The tricuspid valve is normal in structure. Tricuspid valve regurgitation is trivial.  Aortic Valve: The aortic valve is tricuspid. Aortic valve regurgitation is not visualized. Aortic valve mean gradient measures 4.0 mmHg. Aortic valve peak gradient measures 7.8 mmHg. Aortic valve area, by VTI measures 2.25 cm.  Pulmonic Valve: The pulmonic valve was normal in structure. Pulmonic valve regurgitation is not visualized.  Aorta: The aortic root and ascending aorta are structurally normal, with no evidence of dilitation.  Venous: The inferior vena cava is normal in size with greater than 50% respiratory variability, suggesting right atrial pressure of 3 mmHg.  IAS/Shunts: No atrial level shunt detected by color flow Doppler.   LEFT VENTRICLE PLAX 2D LVIDd:         4.70 cm   Diastology LVIDs:         3.00 cm   LV e' medial:    10.90 cm/s LV PW:         0.70 cm   LV E/e' medial:  8.1 LV IVS:        0.90 cm   LV e' lateral:   14.00 cm/s LVOT diam:     2.10 cm   LV E/e' lateral: 6.3 LV SV:         71 LV SV Index:   37 LVOT Area:     3.46 cm  3D Volume EF: 3D EF:        62 % LV EDV:       113 ml LV ESV:       43 ml LV SV:        70 ml  RIGHT VENTRICLE             IVC RV S prime:     10.40 cm/s  IVC diam: 1.60 cm TAPSE (M-mode): 2.1 cm  LEFT ATRIUM             Index        RIGHT ATRIUM           Index LA diam:        3.30 cm 1.71 cm/m   RA Area:     14.90 cm LA Vol (A2C):   50.1 ml 26.00 ml/m  RA Volume:   37.40 ml  19.41 ml/m LA Vol (A4C):   50.9 ml  26.42 ml/m LA Biplane Vol: 53.4 ml 27.72 ml/m AORTIC VALVE AV Area (Vmax):    2.31 cm AV Area (Vmean):   2.22 cm AV Area (VTI):     2.25 cm AV Vmax:           140.00 cm/s AV Vmean:          93.700 cm/s AV VTI:            0.315 m AV Peak Grad:      7.8 mmHg AV Mean Grad:      4.0 mmHg LVOT Vmax:         93.30 cm/s LVOT Vmean:        59.933 cm/s LVOT VTI:          0.204 m LVOT/AV VTI ratio: 0.65  AORTA Ao Root diam: 3.20 cm Ao Asc diam:  2.90 cm  MITRAL VALVE MV Area (PHT): 3.65 cm    SHUNTS MV Decel Time: 208 msec    Systemic VTI:  0.20 m MV E velocity: 88.70 cm/s  Systemic Diam: 2.10 cm MV A velocity: 71.80 cm/s MV E/A ratio:  1.24  Dietrich Pates MD Electronically signed by Dietrich Pates MD Signature Date/Time: 03/04/2022/5:22:00 PM    Final    MONITORS  LONG TERM MONITOR (3-14 DAYS) 04/05/2022  Narrative Patch Wear Time:  10 days and 7 hours (2024-03-01T11:59:40-0500 to 2024-03-11T20:35:43-0400)  Patient had a min HR of 36 bpm, max HR of 171 bpm, and avg HR of 75 bpm. Predominant underlying rhythm was Sinus Rhythm. Second Degree AV Block-Mobitz I (Wenckebach) was present. Isolated SVEs were rare (<1.0%), and no SVE Couplets or SVE Triplets were present. Isolated VEs were rare (<1.0%), VE Couplets were rare (<1.0%), and no VE Triplets were present.  SUMMARY: The basic rhythm is normal sinus with an average HR of 75 bpm. Rare PAC's and PVC's noted. Few episodes of Mobitz 2 AV block noted during sleep hours. Normal sinus rhythm at times of patient-triggered events.    CARDIAC MRI  MR CARDIAC MORPHOLOGY W WO CONTRAST 03/04/2022  Narrative CLINICAL DATA:  Clinical question of myocarditis Study assumes BSA of 2.01 m2.  EXAM: CARDIAC MRI  TECHNIQUE: The patient was scanned on a 1.5 Tesla GE magnet. A dedicated cardiac coil was used. Functional imaging was done using Fiesta sequences. 2,3, and 4 chamber views were done to assess for RWMA's. Modified Simpson's rule  using a short axis stack was used to calculate an ejection fraction on a dedicated work [employer]. The patient  received 10 cc of Gadavist. After 10 minutes inversion recovery sequences were used to assess for infiltration and scar tissue. Flow quantification was performed 2 times during this examination with flow quantification performed at the levels of the ascending aorta above the valve, pulmonary artery above the valve.  CONTRAST:  10 cc  of Gadavist  FINDINGS: 1. Normal left ventricular size, with LVEDD 50 mm, and LVEDVi 70 mL/m2.  Normal left ventricular thickness, with intraventricular septal thickness of 8 mm, posterior wall thickness of 8 mm, and myocardial mass index of 43 g/m2.  Normal left ventricular systolic function (LVEF =65%). There are no regional wall motion abnormalities.  Left ventricular parametric mapping notable for increased apical lateral T2 signal (62 ms).  Suboptimal Native T1 acquisition limits ECV analysis.  There is late gadolinium enhancement in the left ventricular myocardium: Transmural apical lateral LGE.  2.  Mild increase in right ventricular size with RVEDVI 88 mL/m2.  Normal right ventricular thickness.  Low normal right ventricular systolic function (RVEF =47%). There are no regional wall motion abnormalities or aneurysms.  3.  Normal left and right atrial size.  4. Normal size of the aortic root, ascending aorta and pulmonary artery.  5. Valve assessment:  Aortic Valve: Tri-leaflet aortic valve. There is no significant regurgitation. Regurgitant fraction < 1%.  Pulmonic Valve: There is no significant regurgitation. Regurgitant fraction < 1%.  Tricuspid Valve: There is mild central regurgitation. Regurgitant fraction 7%.  Mitral Valve: There is mild central regurgitation. Regurgitant fraction 13%.  6. Apical lateral pericardial thickening with pericardial hyper-enhancement. Trivial pericardial  effusion.  7. Grossly, no extracardiac findings. Recommended dedicated study if concerned for non-cardiac pathology.  IMPRESSION: Study meets 2018 [facility] Criteria for myopericarditis.  Riley Lam MD   Electronically Signed By: Riley Lam M.D. On: 03/04/2022 13:06         Assessment & Plan    1.  Myopericarditis: -Patient presented to the ED with throbbing headache and chest pain with elevated troponins.  She underwent LHC that revealed no evidence of SCAD.  Cardiac MRI was completed and showed evidence of late gadolinium enhancement with verification of myopericarditis. -She was started on colchicine 0.6 mg for 3 months and ibuprofen. -Today patient reports some improvement to her chest pain but does report occasional sharp pain that occurs with palpitations. -She will take her ibuprofen twice daily instead of 3 times a day and we will have her resume 800 mg 3 times a day for 1 week.   2.  Nonsustained SVT: -Patient developed palpitations and chest pain with 20 beat run of NSVT on telemetry.  She was treated with metoprolol 25 mg twice daily with no recurrence. -Today patient reports recurrence of palpitations that occur with and without activity and have a heavy sensation in her chest. -We discussed avoiding triggers such as caffeine and alcohol -She will wear ZIO monitor for 14 days to evaluate for arrhythmia and rule out tachycardia.   3.  Obesity: -Patient's BMI is 37.71 kg/m and she is currently in the process of losing weight and exercising. -She is motivated to follow a heart healthy diet and continues to exercise greater than 150 minutes/week.          Disposition: Follow-up with Tonny Bollman, MD or APP in *** months {Are you ordering a CV Procedure (e.g. stress test, cath, DCCV, TEE, etc)?   Press F2        :[id number]}   Medication Adjustments/Labs and Tests Ordered: Current  medicines are reviewed at length with the patient  today.  Concerns regarding medicines are outlined above.   Signed, Napoleon Form, Leodis Rains, NP 05/03/2022, 8:30 AM [facility]  Note:  This document was prepared using Dragon voice recognition software and may include unintentional dictation errors.

## 2022-05-04 ENCOUNTER — Ambulatory Visit: Payer: BC Managed Care – PPO | Admitting: Nurse Practitioner

## 2022-05-04 DIAGNOSIS — E669 Obesity, unspecified: Secondary | ICD-10-CM

## 2022-05-04 DIAGNOSIS — I309 Acute pericarditis, unspecified: Secondary | ICD-10-CM

## 2022-05-04 DIAGNOSIS — I4729 Other ventricular tachycardia: Secondary | ICD-10-CM

## 2022-05-06 NOTE — Progress Notes (Addendum)
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 female who returns for follow-up of myopericarditis.  She was admitted in February 2024 with chest pain and markedly elevated troponins.  She had no CAD on cardiac catheterization.  EF was normal on echocardiogram.  Cardiac MRI had findings consistent with myopericarditis.  She has been treated with a combination of NSAIDs and colchicine.  She was also placed on beta-blocker therapy secondary to nonsustained ventricular tachycardia noted on telemetry in the hospital.  Recommendation was to proceed with exercise testing at 3 months prior to resuming exercise.  She was last seen in the office by Robin Searing, NP 03/19/2022.  She was having some palpitations.  Monitor revealed rare PVCs and PACs.  She did have a few Mobitz type II episodes during sleep. She is here alone. She is feeling much better. She has not had chest pain, shortness of breath, palpitations, syncope.   Review of Systems  Gastrointestinal:  Negative for abdominal pain.       Studies Reviewed:    EKG:  not done   Risk Assessment/Calculations:             Physical Exam:   VS:   BP 110/80   Pulse 70   Ht  (1.575 m)   Wt 203 lb 9.6 oz (92.4 kg)   SpO2 97%   BMI 37.24 kg/m    Wt Readings from Last 3 Encounters:  05/07/22 203 lb 9.6 oz (92.4 kg)  03/19/22 206 lb 3.2 oz (93.5 kg)  03/09/22 206 lb (93.4 kg)    Constitutional:      Appearance: Healthy appearance. Not in distress.  Neck:     Vascular: JVD normal.  Pulmonary:     Breath sounds: Normal breath sounds. No wheezing. No rales.  Cardiovascular:     Normal rate. Regular rhythm. Normal S1. Normal S2.      Murmurs: There is no murmur.     No rub.  Edema:    Peripheral edema absent.  Abdominal:     Palpations: Abdomen is soft.      ASSESSMENT AND PLAN:   Myopericarditis Admitted in Feb 2024 with Type II MI due to myocardial injury from Myopericarditis. She was noted to have NSVT on tele during her admission. She had no CAD on cardiac catheterization. EF was normal on echocardiogram. MRI was c/w myopericarditis. Recent monitor was w/o sustained arrhythmia. She is feeling much better and has been off of Ibuprofen for several weeks.  Continue colchicine 0.6 mg twice daily for total of 3 mos (DC colchicine after 06/02/2022) Arrange ETT in mid May prior to resuming physical exercise F/u 6 mos  Dispo:  Return in about 6 months (around 11/06/2022) for Routine Follow Up, w/ Dr. Excell Seltzer.  Signed, Tereso Newcomer, PA-C

## 2022-05-07 ENCOUNTER — Encounter: Payer: Self-pay | Admitting: *Deleted

## 2022-05-07 ENCOUNTER — Encounter: Payer: Self-pay | Admitting: Physician Assistant

## 2022-05-07 ENCOUNTER — Ambulatory Visit: Payer: BC Managed Care – PPO | Attending: Physician Assistant | Admitting: Physician Assistant

## 2022-05-07 VITALS — BP 110/80 | HR 70 | Ht 62.0 in | Wt 203.6 lb

## 2022-05-07 DIAGNOSIS — I319 Disease of pericardium, unspecified: Secondary | ICD-10-CM | POA: Diagnosis not present

## 2022-05-07 DIAGNOSIS — I4729 Other ventricular tachycardia: Secondary | ICD-10-CM | POA: Diagnosis not present

## 2022-05-07 NOTE — Patient Instructions (Addendum)
Medication Instructions:    DISCONTINUE Colchicine AFTER MAY 15.  *If you need a refill on your cardiac medications before your next appointment, please call your pharmacy*   Lab Work:  None ordered.  If you have labs (blood work) drawn today and your tests are completely normal, you will receive your results only by: MyChart Message (if you have MyChart) OR A paper copy in the mail If you have any lab test that is abnormal or we need to change your treatment, we will call you to review the results.   Testing/Procedures:  Your physician has requested that you have an exercise tolerance test. For further information please visit [email or web address] Please also follow instruction sheet, as given. See Attached letter.      Follow-Up: At [facility], you and your health needs are our priority.  As part of our continuing mission to provide you with exceptional heart care, we have created designated Provider Care Teams.  These Care Teams include your primary Cardiologist (physician) and Advanced Practice Providers (APPs -  Physician Assistants and Nurse Practitioners) who all work together to provide you with the care you need, when you need it.  We recommend signing up for the patient portal called "MyChart".  Sign up information is provided on this After Visit Summary.  MyChart is used to connect with patients for Virtual Visits (Telemedicine).  Patients are able to view lab/test results, encounter notes, upcoming appointments, etc.  Non-urgent messages can be sent to your provider as well.   To learn more about what you can do with MyChart, go to [email or web address].    Your next appointment:   6 month(s)  Provider:   Tonny Bollman, MD     Other Instructions  Your physician wants you to follow-up in: 6 months with Dr. Excell Seltzer. You will receive a reminder letter in the mail two months in advance. If you don't receive a letter, please call our office to schedule the  follow-up appointment.

## 2022-05-07 NOTE — Assessment & Plan Note (Addendum)
Admitted in Feb 2024 with Type II MI due to myocardial injury from Myopericarditis. She was noted to have NSVT on tele during her admission. She had no CAD on cardiac catheterization. EF was normal on echocardiogram. MRI was c/w myopericarditis. Recent monitor was w/o sustained arrhythmia. She is feeling much better and has been off of Ibuprofen for several weeks.  Continue colchicine 0.6 mg twice daily for total of 3 mos (DC colchicine after 06/02/2022) Arrange ETT in mid May prior to resuming physical exercise F/u 6 mos

## 2022-06-08 ENCOUNTER — Ambulatory Visit: Payer: BC Managed Care – PPO | Attending: Cardiovascular Disease

## 2022-06-08 DIAGNOSIS — I319 Disease of pericardium, unspecified: Secondary | ICD-10-CM | POA: Diagnosis not present

## 2022-06-08 DIAGNOSIS — I4729 Other ventricular tachycardia: Secondary | ICD-10-CM | POA: Diagnosis not present

## 2022-06-08 LAB — EXERCISE TOLERANCE TEST
Angina Index: 0
Duke Treadmill Score: 10
Estimated workload: 11.2
Exercise duration (min): 9 min
Exercise duration (sec): 36 s
MPHR: 183 {beats}/min
Peak HR: 169 {beats}/min
Percent HR: 92 %
RPE: 17
Rest HR: 69 {beats}/min
ST Depression (mm): 0 mm

## 2022-07-02 ENCOUNTER — Other Ambulatory Visit: Payer: Self-pay | Admitting: Nurse Practitioner

## 2022-07-05 ENCOUNTER — Ambulatory Visit: Payer: BC Managed Care – PPO | Admitting: Nurse Practitioner

## 2022-07-12 ENCOUNTER — Ambulatory Visit (INDEPENDENT_AMBULATORY_CARE_PROVIDER_SITE_OTHER): Payer: BC Managed Care – PPO | Admitting: Internal Medicine

## 2022-07-12 ENCOUNTER — Encounter: Payer: Self-pay | Admitting: Internal Medicine

## 2022-07-12 VITALS — BP 110/68 | HR 66 | Temp 97.5°F | Ht 62.0 in | Wt 220.0 lb

## 2022-07-12 DIAGNOSIS — Z202 Contact with and (suspected) exposure to infections with a predominantly sexual mode of transmission: Secondary | ICD-10-CM | POA: Insufficient documentation

## 2022-07-12 DIAGNOSIS — R21 Rash and other nonspecific skin eruption: Secondary | ICD-10-CM | POA: Diagnosis not present

## 2022-07-12 MED ORDER — PREDNISONE 20 MG PO TABS: 40.0000 mg | ORAL_TABLET | Freq: Every day | ORAL | 0 refills | Status: DC

## 2022-07-12 NOTE — Assessment & Plan Note (Signed)
Will do testing 

## 2022-07-12 NOTE — Progress Notes (Signed)
Subjective:    Patient ID: Jamie Ortega, female    DOB: May 21, 1985, 37 y.o.   MRN: [id number]  HPI Here due to rash--and concerns about STD  Started last week on left breast--then to the inner portion Then under breasts To right antecubital fossa Little tiny bumps---and red Very itchy None on hands  Using anti itch cream on bug bites she got a week ago  Current Outpatient Medications on File Prior to Visit  Medication Sig Dispense Refill   fluticasone (FLONASE) 50 MCG/ACT nasal spray Place 2 sprays into both nostrils daily as needed for allergies.     Multiple Vitamin (MULTIVITAMIN) tablet Take 1 tablet by mouth daily.     acetaminophen (TYLENOL) 500 MG tablet Take 1,000 mg by mouth daily as needed for mild pain, moderate pain, fever or headache. (Patient not taking: Reported on 07/12/2022)     ibuprofen (ADVIL) 400 MG tablet Take 2 tablets (800 mg) three times daily for 7 days. Then, take 1 tablet (400 mg) three times daily for 7 days (Patient not taking: Reported on 07/12/2022) 63 tablet 0   No current facility-administered medications on file prior to visit.    No Known Allergies  Past Medical History:  Diagnosis Date   Back pain affecting pregnancy in third trimester 02/10/2018   Genital herpes    Labor and delivery, indication for care 05/02/2018    Past Surgical History:  Procedure Laterality Date   LEFT HEART CATH AND CORONARY ANGIOGRAPHY N/A 03/03/2022   Procedure: LEFT HEART CATH AND CORONARY ANGIOGRAPHY;  Surgeon: [place], Peter M, MD;  Location: [facility] INVASIVE CV LAB;  Service: Cardiovascular;  Laterality: N/A;   WISDOM TOOTH EXTRACTION      Family History  Problem Relation Age of Onset   Cervical cancer Mother    Endometriosis Mother    Arthritis Mother    Neuropathy Mother    Hypertension Mother    Anxiety disorder Mother    Post-traumatic stress disorder Mother    Hyperthyroidism Mother    Hyperlipidemia Father    Rheum arthritis Maternal  Grandmother    COPD Maternal Grandmother    Emphysema Maternal Grandmother    Prostate cancer Maternal Grandfather    Cancer Paternal Grandfather        Jaw   Ovarian cancer Neg Hx    Colon cancer Neg Hx    Breast cancer Neg Hx     Social History   Socioeconomic History   Marital status: Married    Spouse name: Not on file   Number of children: 2   Years of education: Not on file   Highest education level: Not on file  Occupational History   Not on file  Tobacco Use   Smoking status: Never   Smokeless tobacco: Never  Vaping Use   Vaping Use: Never used  Substance and Sexual Activity   Alcohol use: Not Currently    Comment: Socially   Drug use: No   Sexual activity: Not Currently    Partners: Male    Birth control/protection: None  Other Topics Concern   Not on file  Social History Narrative   Married.   2 children.    Works at [employer] [place].    Enjoys kayaking, spending time outdoors.    Social Determinants of Health   Financial Resource Strain: Not on file  Food Insecurity: No Food Insecurity (03/04/2022)   Hunger Vital Sign    Worried About Running Out of Food in the Last  Year: Never true    Ran Out of Food in the Last Year: Never true  Transportation Needs: No Transportation Needs (03/04/2022)   PRAPARE - Administrator, Civil Service (Medical): No    Lack of Transportation (Non-Medical): No  Physical Activity: Not on file  Stress: Not on file  Social Connections: Not on file  Intimate Partner Violence: Not At Risk (03/04/2022)   Humiliation, Afraid, Rape, and Kick questionnaire    Fear of Current or Ex-Partner: No    Emotionally Abused: No    Physically Abused: No    Sexually Abused: No   Review of Systems New partner when separated in March Unprotected sex a couple of times No vaginal discharge or abdominal pain     Objective:   Physical Exam Constitutional:      Appearance: Normal appearance.  Skin:    Comments: Multiple (20 or  more) inflamed papules on both feet (looks like ant bites) Non specific macular papular rash on breasts, right antecubital fossa, lower abdomen  Neurological:     Mental Status: She is alert.            Assessment & Plan:

## 2022-07-12 NOTE — Assessment & Plan Note (Signed)
Seems to have a sensitivity reaction to the insect bites on her feet (which are not bothering her much now) Itching keeping her up Will give prednisone 40mg  x 3, 20mg  x 3 Cetirizine 10 daily or bid

## 2022-07-13 LAB — C. TRACHOMATIS/N. GONORRHOEAE RNA
C. trachomatis RNA, TMA: NOT DETECTED
N. gonorrhoeae RNA, TMA: NOT DETECTED

## 2022-07-13 LAB — HIV ANTIBODY (ROUTINE TESTING W REFLEX): HIV 1&2 Ab, 4th Generation: NONREACTIVE

## 2022-07-13 LAB — RPR: RPR Ser Ql: NONREACTIVE

## 2022-07-20 ENCOUNTER — Encounter: Payer: Self-pay | Admitting: Internal Medicine

## 2022-07-20 NOTE — Telephone Encounter (Signed)
Since Dr Alphonsus Sias is out of the office, will send to PCP to see what she thinks.

## 2022-10-07 ENCOUNTER — Other Ambulatory Visit: Payer: Self-pay | Admitting: Cardiovascular Disease

## 2022-12-01 IMAGING — US US EXTREM LOW VENOUS*R*
1 series · 13 of 24 positions shown · non-contrast
Comparison: None.

CLINICAL DATA: Right calf pain.



[Series 1: us extrem low venous*right* · 0.08mm/px · 13 of 32 slices shown]
[im 1/32]
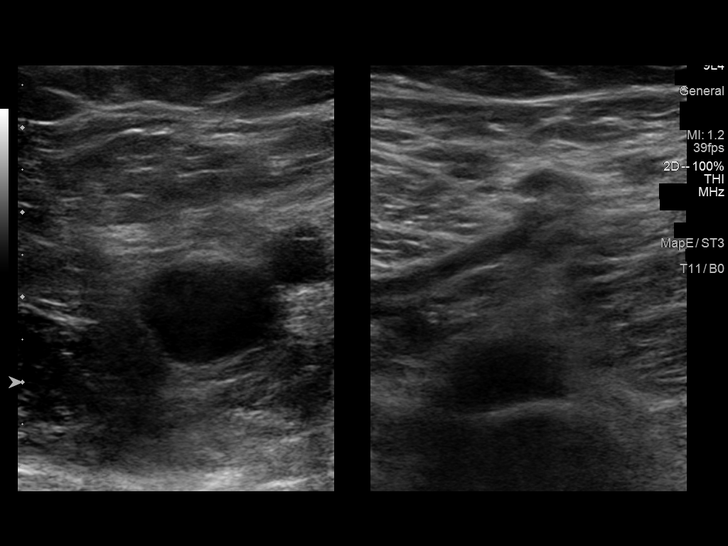
[im 3/32]
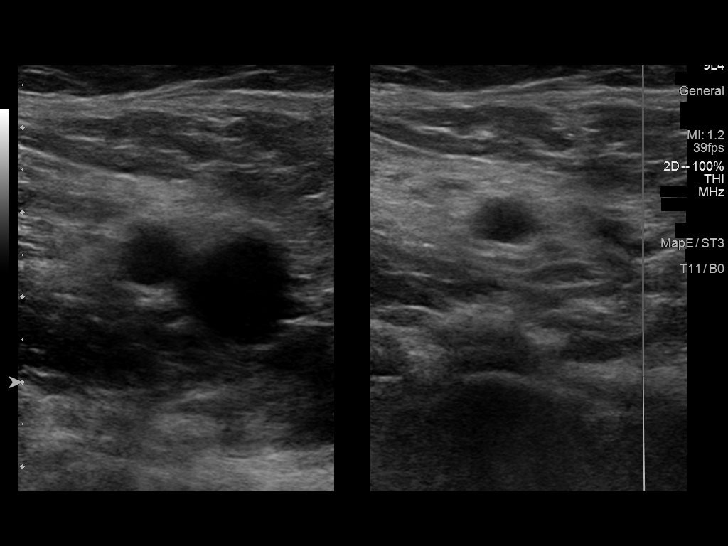
[im 6/32]
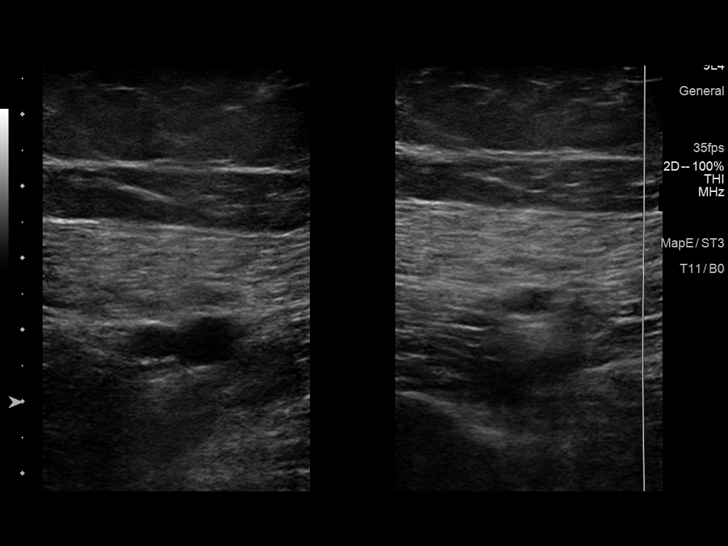
[im 9/32]
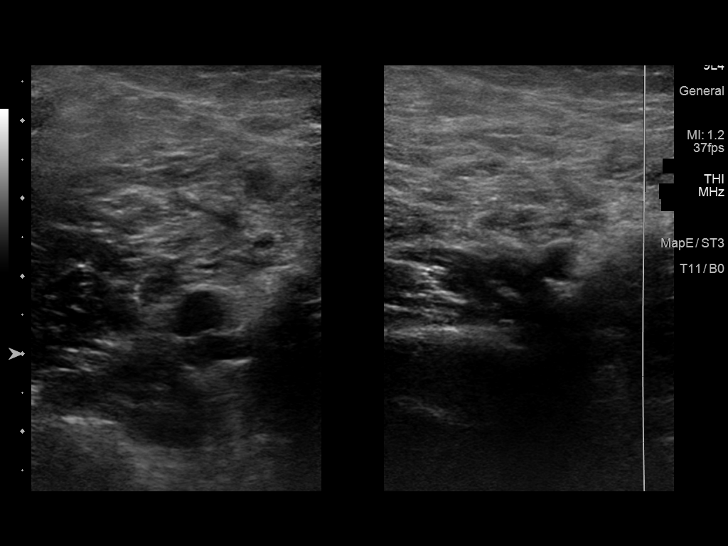
[im 11/32]
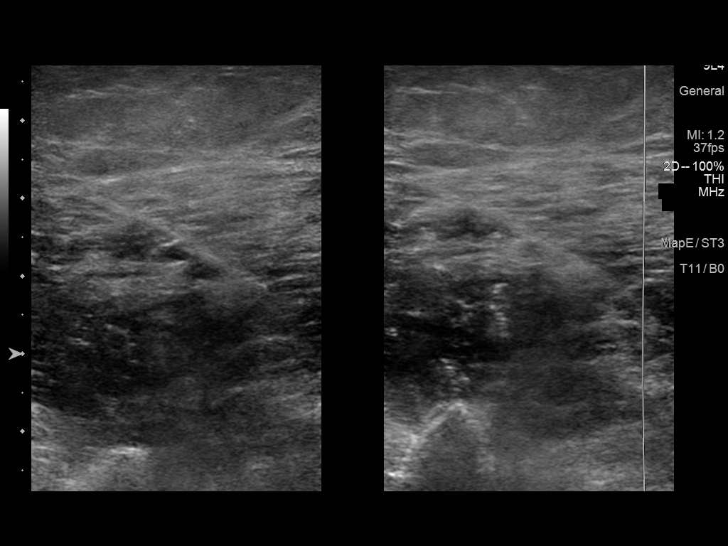
[im 14/32]
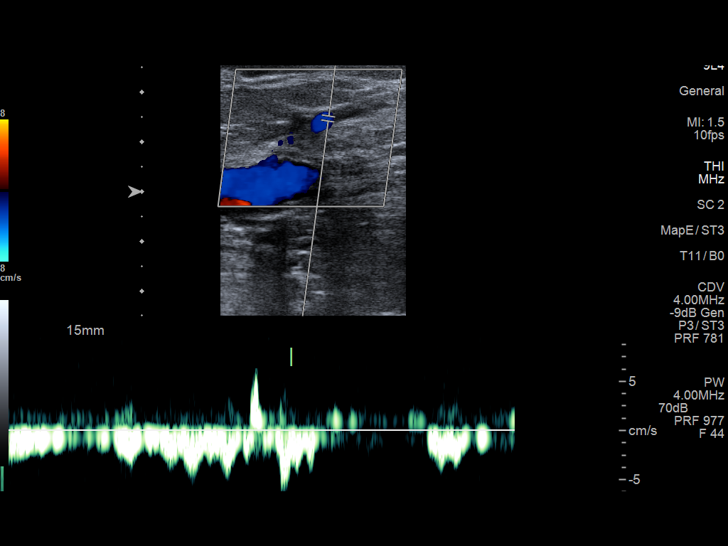
[im 17/32]
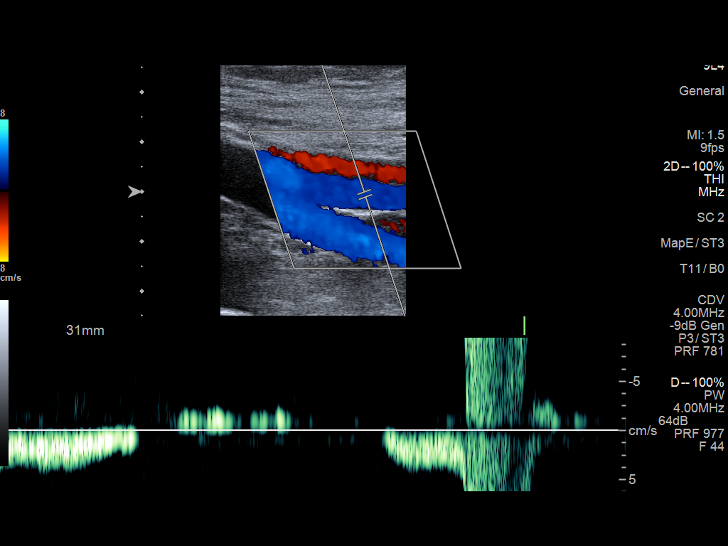
[im 18/32]
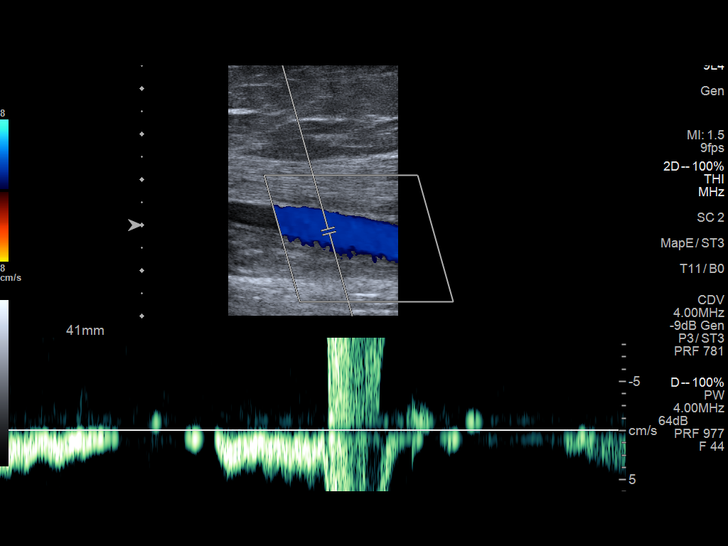
[im 21/32]
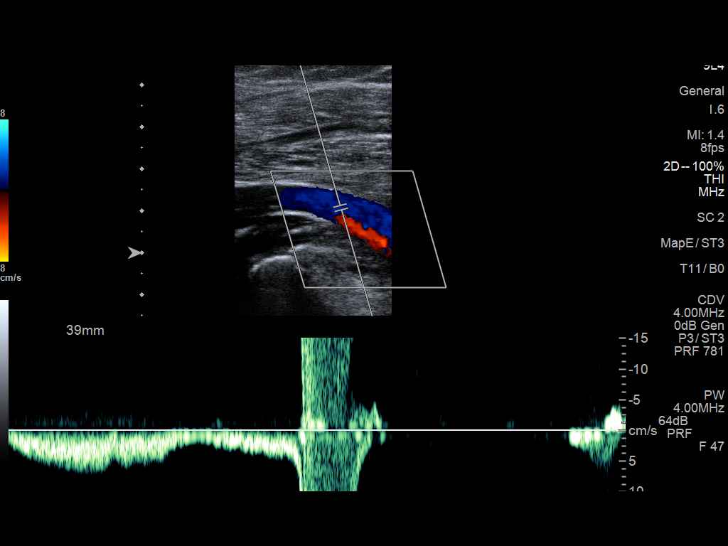
[im 23/32]
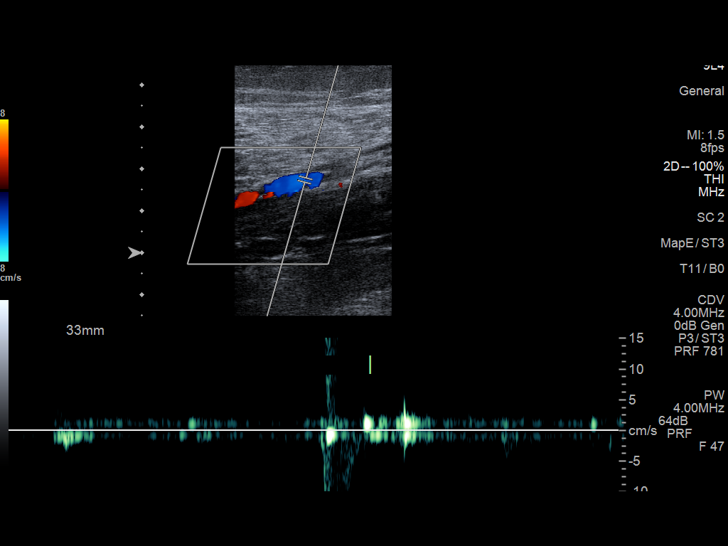
[im 26/32]
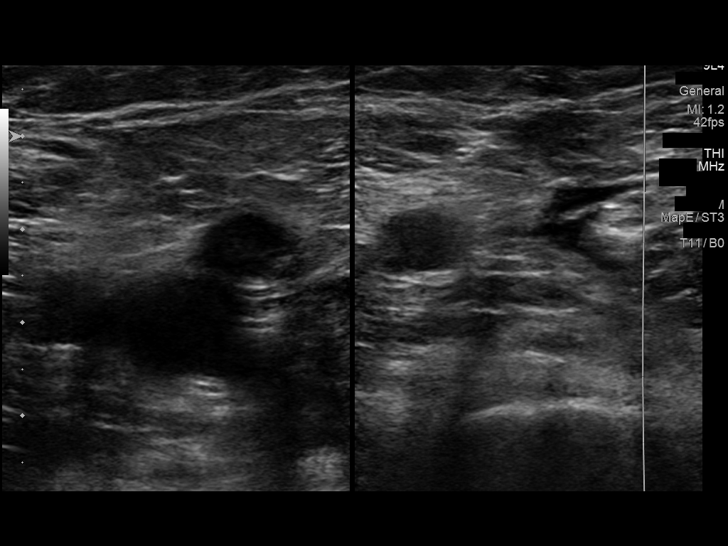
[im 29/32]
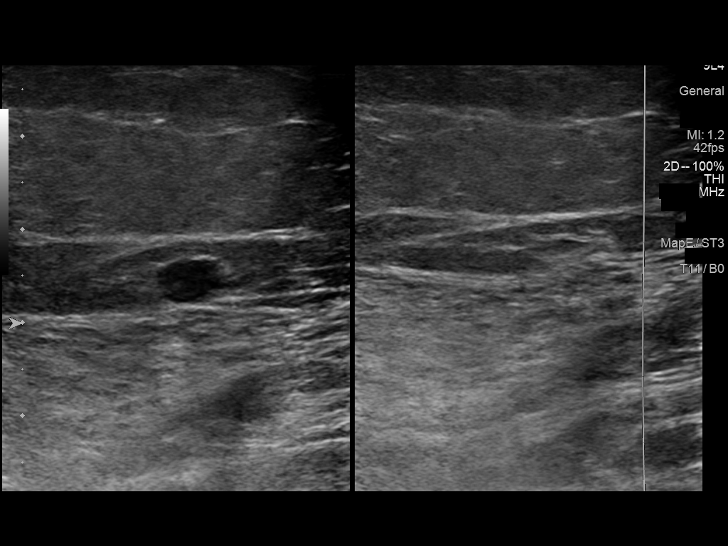
[im 32/32]
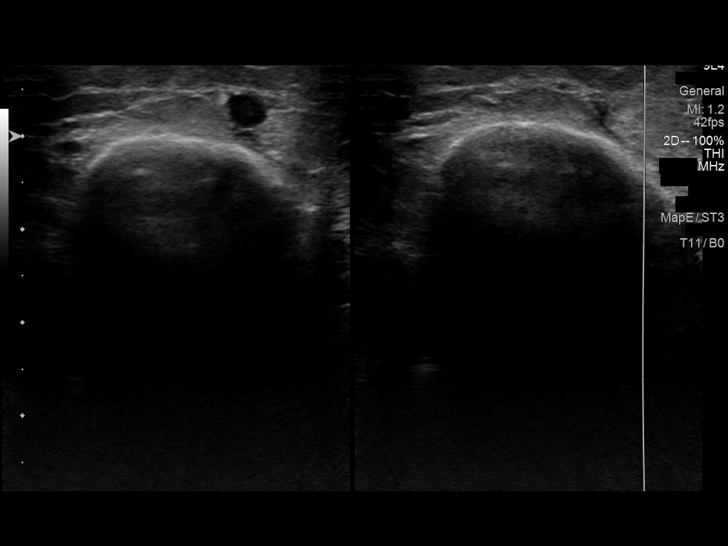

[13 of 24 positions shown; findings below may reference images not displayed]

FINDINGS: Contralateral Common Femoral Vein: Respiratory phasicity is normal
and symmetric with the symptomatic side. No evidence of thrombus.
Normal compressibility.

Common Femoral Vein: No evidence of thrombus. Normal
compressibility, respiratory phasicity and response to augmentation.

Saphenofemoral Junction: No evidence of thrombus. Normal
compressibility and flow on color Doppler imaging.

Profunda Femoral Vein: No evidence of thrombus. Normal
compressibility and flow on color Doppler imaging.

Femoral Vein: No evidence of thrombus. Normal compressibility,
respiratory phasicity and response to augmentation.

Popliteal Vein: No evidence of thrombus. Normal compressibility,
respiratory phasicity and response to augmentation.

Calf Veins: No evidence of thrombus. Normal compressibility and flow
on color Doppler imaging.

Superficial Great Saphenous Vein: No evidence of thrombus. Normal
compressibility.

Venous Reflux:  None.

Other Findings: No evidence of superficial thrombophlebitis or
abnormal fluid collection.
IMPRESSION: No evidence of right lower extremity deep venous thrombosis.

## 2022-12-02 ENCOUNTER — Encounter: Payer: Self-pay | Admitting: Cardiovascular Disease

## 2022-12-02 ENCOUNTER — Ambulatory Visit: Payer: BC Managed Care – PPO | Attending: Cardiovascular Disease | Admitting: Cardiovascular Disease

## 2022-12-02 VITALS — BP 112/86 | HR 77 | Ht 62.0 in | Wt 231.8 lb

## 2022-12-02 DIAGNOSIS — I319 Disease of pericardium, unspecified: Secondary | ICD-10-CM

## 2022-12-02 NOTE — Assessment & Plan Note (Signed)
Reviewed her most recent imaging studies which were consistent with myopericarditis, but normal LVEF.  She is now out to greater than 6 months from this and she is asymptomatic.  Her exercise treadmill study showed no arrhythmia or exertional symptoms.  She can resume all of her prior activities with no restriction.  I encouraged her to get back into her exercise and diet regimen that she will work towards weight loss and healthy lifestyle.  I will plan to see her back in 1 year for follow-up evaluation.  She can continue to use metoprolol as needed for heart palpitations.  I do not think she needs to stay on her regularly scheduled beta-blocker at this point.

## 2022-12-02 NOTE — Progress Notes (Signed)
Cardiology Office Note:    Date:  12/02/2022   ID:  Jamie Ortega, DOB 06/24/1985, MRN [id number]  PCP:  Doreene Nest, NP   [facility] Providers Cardiologist:  Tonny Bollman, MD     Referring MD: Doreene Nest, NP   Chief Complaint  Patient presents with   Follow-up    Myocarditis    History of Present Illness:    Jamie Ortega is a 37 y.o. 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  She has been doing well other than weight gain.  She states that she has not been exercising like she was previously and she has gained about 30 pounds.  She has had no chest pain, chest pressure, or edema.  She has heart palpitations when she is under periods of high stress.  She will take her metoprolol as needed but does this rarely.  She otherwise is doing quite well and having no problems.  Current Medications: Current Meds  Medication Sig   acetaminophen (TYLENOL) 500 MG tablet Take 1,000 mg by mouth daily as needed for mild pain (pain score 1-3), moderate pain (pain score 4-6), fever or headache.   fluticasone (FLONASE) 50 MCG/ACT nasal spray Place 2 sprays into both nostrils daily as needed for allergies.   Multiple Vitamin (MULTIVITAMIN) tablet Take 1 tablet by mouth daily.     Allergies:   Patient has no known allergies.   ROS:   Please see the history of  present illness.    All other systems reviewed and are negative.  EKGs/Labs/Other Studies Reviewed:    The following studies were reviewed today: Cardiac Studies & Procedures   CARDIAC CATHETERIZATION  CARDIAC CATHETERIZATION 03/03/2022  Narrative   The left ventricular systolic function is normal.   LV end diastolic pressure is mildly elevated.   The left ventricular ejection fraction is 55-65% by visual estimate.  Normal coronary anatomy. No evidence of SCAD or thrombosis Normal LV function Mildly elevated LVEDP 16-19 mm Hg  Plan: consider other etiologies such as myocarditis.  Findings Coronary Findings Diagnostic  Dominance: Right  Left Main Vessel was injected. Vessel is normal in caliber. Vessel is angiographically normal.  Left Anterior Descending Vessel was injected. Vessel is normal in caliber. Vessel is angiographically normal.  Left Circumflex Vessel was injected. Vessel is normal in caliber. Vessel is angiographically normal.  Right Coronary Artery Vessel was injected. Vessel is normal in caliber. Vessel is angiographically normal.  Intervention  No interventions have been documented.   STRESS TESTS  EXERCISE TOLERANCE TEST (ETT) 06/08/2022  Narrative   Negative EKG stress test for ischemia.   Average exercise capacity (9:36 min:s; 11.2 METS). Normal HR/BP response to exercise.   ECHOCARDIOGRAM  ECHOCARDIOGRAM COMPLETE 03/04/2022  Narrative ECHOCARDIOGRAM REPORT    Patient Name:   Jamie Ortega Date of Exam: 03/04/2022 Medical Rec #:  [id number]  Height:       62.0 in Accession #:    [id number]                  Weight:       203.7 lb Date of Birth:  [birth year]                    BSA:          1.927 m Patient Age:    36 years                    BP:           103/63 mmHg Patient Gender: F                           HR:           65 bpm. Exam Location:  Inpatient  Procedure: 2D Echo, Cardiac Doppler, Color Doppler and  Strain Analysis  Indications:    Chest Pain R07.9  History:        Patient has no prior history of Echocardiogram examinations. Previous Myocardial Infarction.  Sonographer:    Lucendia Herrlich Referring Phys: Arty Baumgartner  IMPRESSIONS   1. Left ventricular ejection fraction, by estimation, is 65 to 70%. The left ventricle has normal function. The left ventricle has no regional wall motion abnormalities. Left ventricular diastolic parameters were normal. 2. Right ventricular systolic function is normal. The right ventricular size is normal. 3. The mitral valve is normal in structure. No evidence of mitral valve regurgitation. 4. The aortic valve is tricuspid. Aortic valve regurgitation is not visualized. 5. The inferior vena cava is normal in size with greater than 50% respiratory variability, suggesting right atrial pressure of 3 mmHg.  FINDINGS Left Ventricle: Left ventricular ejection fraction, by estimation, is 65 to 70%. The left ventricle has normal function. The left ventricle has no regional wall motion abnormalities. The left ventricular internal cavity size was normal in size. There is no left ventricular hypertrophy. Left ventricular diastolic parameters were normal.  Right Ventricle: The right ventricular size is normal. Right vetricular wall thickness was not assessed. Right ventricular systolic function is normal.  Left Atrium: Left atrial size was normal in size.  Right Atrium: Right atrial size was normal in size.  Pericardium: There is no evidence of pericardial effusion.  Mitral Valve: The mitral valve is normal in structure. No evidence of mitral valve regurgitation.  Tricuspid Valve: The tricuspid valve is normal in structure. Tricuspid valve regurgitation is trivial.  Aortic Valve: The aortic valve is tricuspid. Aortic valve regurgitation is not visualized. Aortic valve mean gradient measures 4.0 mmHg. Aortic valve peak gradient measures 7.8 mmHg. Aortic  valve area, by VTI measures 2.25 cm.  Pulmonic Valve: The pulmonic valve was normal in structure. Pulmonic valve regurgitation is not visualized.  Aorta: The aortic root and ascending aorta are structurally normal, with no evidence of dilitation.  Venous: The inferior vena cava is normal in size with greater than 50% respiratory variability, suggesting right atrial pressure of 3 mmHg.  IAS/Shunts: No atrial level shunt detected by color flow Doppler.   LEFT VENTRICLE PLAX 2D LVIDd:         4.70 cm   Diastology LVIDs:         3.00 cm   LV e' medial:    10.90 cm/s LV PW:         0.70 cm  LV E/e' medial:  8.1 LV IVS:        0.90 cm   LV e' lateral:   14.00 cm/s LVOT diam:     2.10 cm   LV E/e' lateral: 6.3 LV SV:         71 LV SV Index:   37 LVOT Area:     3.46 cm  3D Volume EF: 3D EF:        62 % LV EDV:       113 ml LV ESV:       43 ml LV SV:        70 ml  RIGHT VENTRICLE             IVC RV S prime:     10.40 cm/s  IVC diam: 1.60 cm TAPSE (M-mode): 2.1 cm  LEFT ATRIUM             Index        RIGHT ATRIUM           Index LA diam:        3.30 cm 1.71 cm/m   RA Area:     14.90 cm LA Vol (A2C):   50.1 ml 26.00 ml/m  RA Volume:   37.40 ml  19.41 ml/m LA Vol (A4C):   50.9 ml 26.42 ml/m LA Biplane Vol: 53.4 ml 27.72 ml/m AORTIC VALVE AV Area (Vmax):    2.31 cm AV Area (Vmean):   2.22 cm AV Area (VTI):     2.25 cm AV Vmax:           140.00 cm/s AV Vmean:          93.700 cm/s AV VTI:            0.315 m AV Peak Grad:      7.8 mmHg AV Mean Grad:      4.0 mmHg LVOT Vmax:         93.30 cm/s LVOT Vmean:        59.933 cm/s LVOT VTI:          0.204 m LVOT/AV VTI ratio: 0.65  AORTA Ao Root diam: 3.20 cm Ao Asc diam:  2.90 cm  MITRAL VALVE MV Area (PHT): 3.65 cm    SHUNTS MV Decel Time: 208 msec    Systemic VTI:  0.20 m MV E velocity: 88.70 cm/s  Systemic Diam: 2.10 cm MV A velocity: 71.80 cm/s MV E/A ratio:  1.24  Dietrich Pates MD Electronically signed by Dietrich Pates MD Signature Date/Time: 03/04/2022/5:22:00 PM    Final    MONITORS  LONG TERM MONITOR (3-14 DAYS) 04/02/2022  Narrative Patch Wear Time:  10 days and 7 hours (2024-03-01T11:59:40-0500 to 2024-03-11T20:35:43-0400)  Patient had a min HR of 36 bpm, max HR of 171 bpm, and avg HR of 75 bpm. Predominant underlying rhythm was Sinus Rhythm. Second Degree AV Block-Mobitz I (Wenckebach) was present. Isolated SVEs were rare (<1.0%), and no SVE Couplets or SVE Triplets were present. Isolated VEs were rare (<1.0%), VE Couplets were rare (<1.0%), and no VE Triplets were present.  SUMMARY: The basic rhythm is normal sinus with an average HR of 75 bpm. Rare PAC's and PVC's noted. Few episodes of Mobitz 2 AV block noted during sleep hours. Normal sinus rhythm at times of patient-triggered events.    CARDIAC MRI  MR CARDIAC MORPHOLOGY W WO CONTRAST 03/04/2022  Narrative CLINICAL DATA:  Clinical question of myocarditis Study assumes BSA of 2.01 m2.  EXAM: CARDIAC MRI  TECHNIQUE: The patient was scanned on a 1.5 Tesla GE magnet. A dedicated cardiac coil was used. Functional imaging was done using Fiesta sequences. 2,3, and 4 chamber views were done to assess for RWMA's. Modified Simpson's rule using a short axis stack was used to calculate an ejection fraction on a dedicated work [employer]. The patient received 10 cc of Gadavist. After 10 minutes inversion recovery sequences were used to assess for infiltration and scar tissue. Flow quantification was performed 2 times during this examination with flow quantification performed at the levels of the ascending aorta above the valve, pulmonary artery above the valve.  CONTRAST:  10 cc  of Gadavist  FINDINGS: 1. Normal left ventricular size, with LVEDD 50 mm, and LVEDVi 70 mL/m2.  Normal left ventricular thickness, with intraventricular septal thickness of 8 mm, posterior wall thickness of 8 mm, and myocardial mass  index of 43 g/m2.  Normal left ventricular systolic function (LVEF =65%). There are no regional wall motion abnormalities.  Left ventricular parametric mapping notable for increased apical lateral T2 signal (62 ms).  Suboptimal Native T1 acquisition limits ECV analysis.  There is late gadolinium enhancement in the left ventricular myocardium: Transmural apical lateral LGE.  2.  Mild increase in right ventricular size with RVEDVI 88 mL/m2.  Normal right ventricular thickness.  Low normal right ventricular systolic function (RVEF =47%). There are no regional wall motion abnormalities or aneurysms.  3.  Normal left and right atrial size.  4. Normal size of the aortic root, ascending aorta and pulmonary artery.  5. Valve assessment:  Aortic Valve: Tri-leaflet aortic valve. There is no significant regurgitation. Regurgitant fraction < 1%.  Pulmonic Valve: There is no significant regurgitation. Regurgitant fraction < 1%.  Tricuspid Valve: There is mild central regurgitation. Regurgitant fraction 7%.  Mitral Valve: There is mild central regurgitation. Regurgitant fraction 13%.  6. Apical lateral pericardial thickening with pericardial hyper-enhancement. Trivial pericardial effusion.  7. Grossly, no extracardiac findings. Recommended dedicated study if concerned for non-cardiac pathology.  IMPRESSION: Study meets 2018 [facility] Criteria for myopericarditis.  Riley Lam MD   Electronically Signed By: Riley Lam M.D. On: 03/04/2022 13:06         EKG:        Recent Labs: 03/03/2022: ALT 23; TSH 0.599 03/04/2022: BUN 11; Creatinine, Ser 0.99; Magnesium 2.1; Potassium 4.3; Sodium 137 03/05/2022: Hemoglobin 12.4; Platelets 270  Recent Lipid Panel    Component Value Date/Time   CHOL 166 03/04/2022 0312   TRIG 131 03/04/2022 0312   HDL 46 03/04/2022 0312   CHOLHDL 3.6 03/04/2022 0312   VLDL 26 03/04/2022 0312   LDLCALC 94  03/04/2022 0312   LDLCALC 101 (H) 02/22/2020 1550     Risk Assessment/Calculations:                Physical Exam:    VS:  BP 112/86 (BP Location: Left Arm, Patient Position: Sitting, Cuff Size: Normal)   Pulse 77   Ht 5\' 2"  (1.575 m)   Wt 231 lb 12.8 oz (105.1 kg)   SpO2 96%   BMI 42.40 kg/m     Wt Readings from Last 3 Encounters:  12/02/22 231 lb 12.8 oz (105.1 kg)  07/12/22 220 lb (99.8 kg)  05/07/22 203 lb 9.6 oz (92.4 kg)     GEN:  Well nourished, well developed in no acute distress HEENT: Normal NECK: No JVD; No carotid bruits LYMPHATICS: No lymphadenopathy CARDIAC: RRR,  no murmurs, rubs, gallops RESPIRATORY:  Clear to auscultation without rales, wheezing or rhonchi  ABDOMEN: Soft, non-tender, non-distended MUSCULOSKELETAL:  No edema; No deformity  SKIN: Warm and dry NEUROLOGIC:  Alert and oriented x 3 PSYCHIATRIC:  Normal affect   Assessment & Plan Myopericarditis Reviewed her most recent imaging studies which were consistent with myopericarditis, but normal LVEF.  She is now out to greater than 6 months from this and she is asymptomatic.  Her exercise treadmill study showed no arrhythmia or exertional symptoms.  She can resume all of her prior activities with no restriction.  I encouraged her to get back into her exercise and diet regimen that she will work towards weight loss and healthy lifestyle.  I will plan to see her back in 1 year for follow-up evaluation.  She can continue to use metoprolol as needed for heart palpitations.  I do not think she needs to stay on her regularly scheduled beta-blocker at this point.      Medication Adjustments/Labs and Tests Ordered: Current medicines are reviewed at length with the patient today.  Concerns regarding medicines are outlined above.  No orders of the defined types were placed in this encounter.  No orders of the defined types were placed in this encounter.   Patient Instructions  Follow-Up: At [facility], you and your health needs are our priority.  As part of our continuing mission to provide you with exceptional heart care, we have created designated Provider Care Teams.  These Care Teams include your primary Cardiologist (physician) and Advanced Practice Providers (APPs -  Physician Assistants and Nurse Practitioners) who all work together to provide you with the care you need, when you need it.  Your next appointment:   1 year(s)  Provider:   Tonny Bollman, MD  or APP      Signed, Tonny Bollman, MD  12/02/2022 10:37 AM     [facility]

## 2022-12-02 NOTE — Patient Instructions (Signed)
Follow-Up: At [facility], you and your health needs are our priority.  As part of our continuing mission to provide you with exceptional heart care, we have created designated Provider Care Teams.  These Care Teams include your primary Cardiologist (physician) and Advanced Practice Providers (APPs -  Physician Assistants and Nurse Practitioners) who all work together to provide you with the care you need, when you need it.  Your next appointment:   1 year(s)  Provider:   Tonny Bollman, MD  or APP

## 2023-02-04 DIAGNOSIS — M545 Low back pain, unspecified: Secondary | ICD-10-CM | POA: Diagnosis not present

## 2023-02-15 ENCOUNTER — Ambulatory Visit: Payer: BC Managed Care – PPO | Admitting: Family

## 2023-02-15 ENCOUNTER — Encounter: Payer: Self-pay | Admitting: Family

## 2023-02-15 VITALS — BP 112/78 | HR 88 | Temp 97.9°F | Ht 62.0 in | Wt 229.4 lb

## 2023-02-15 DIAGNOSIS — R42 Dizziness and giddiness: Secondary | ICD-10-CM | POA: Insufficient documentation

## 2023-02-15 DIAGNOSIS — K2100 Gastro-esophageal reflux disease with esophagitis, without bleeding: Secondary | ICD-10-CM | POA: Diagnosis not present

## 2023-02-15 MED ORDER — MECLIZINE HCL 25 MG PO TABS: ORAL_TABLET | ORAL | 0 refills | Status: DC

## 2023-02-15 MED ORDER — PANTOPRAZOLE SODIUM 40 MG PO TBEC: 40.0000 mg | DELAYED_RELEASE_TABLET | Freq: Every day | ORAL | 0 refills | Status: DC

## 2023-02-15 NOTE — Assessment & Plan Note (Signed)
Try to decrease and or avoid spicy foods, fried fatty foods, and also caffeine and chocolate as these can increase heartburn symptoms.   Trial pantoprazole  If no improvement with diet modifications and PPI could consider h pylori testing and or alpha gal  Pt currently with diarrhea so I also suspect GI virus

## 2023-02-15 NOTE — Progress Notes (Signed)
Established Patient Office Visit  Subjective:   Patient ID: Jamie Ortega, female    DOB: 1985-07-28  Age: 38 y.o. MRN: [id number]  CC:  Chief Complaint  Patient presents with   Acute Visit    Pt is complaining of acid reflux x3 weeks and vertigo x1 week.    HPI: Jamie Ortega is a 38 y.o. female presenting on 02/15/2023 for Acute Visit (Pt is complaining of acid reflux x3 weeks and vertigo x1 week.)  Over the last three weeks having worsening reflux, having to sit up to improve symptoms, eating blueberries which also helps with her reflux but is constant, and gives her a full feeling which is making it hard for her to eat. This also causes nausea on top of it.   One week ago had a vertigo attack, the room was spinning and was so nauseous she had to lie on her stomach face down to sleep it off. Yesterday when she got up she felt 'her head was disconnected from her body'. Felt 'out of it'. Decreased energy.   She denies nasal congestion, she does sniffle often. Denies ear pain and or sore throat. No coughing. No fever.   No abd pain, but does have diarrhea several times a day (started yesterday). No vomiting.      ROS: Negative unless specifically indicated above in HPI.   Relevant past medical history reviewed and updated as indicated.   Allergies and medications reviewed and updated.   Current Outpatient Medications:    acetaminophen (TYLENOL) 500 MG tablet, Take 1,000 mg by mouth daily as needed for mild pain (pain score 1-3), moderate pain (pain score 4-6), fever or headache., Disp: , Rfl:    fluticasone (FLONASE) 50 MCG/ACT nasal spray, Place 2 sprays into both nostrils daily as needed for allergies., Disp: , Rfl:    meclizine (ANTIVERT) 25 MG tablet, Take one to 1/2 tablet bid prn vertigo symptoms, Disp: 30 tablet, Rfl: 0   Multiple Vitamin (MULTIVITAMIN) tablet, Take 1 tablet by mouth daily., Disp: , Rfl:    pantoprazole (PROTONIX) 40 MG tablet, Take 1  tablet (40 mg total) by mouth daily., Disp: 30 tablet, Rfl: 0  No Known Allergies  Objective:   BP 112/78 (BP Location: Left Arm, Patient Position: Sitting, Cuff Size: Large)   Pulse 88   Temp 97.9 F (36.6 C) (Temporal)   Ht 5\' 2"  (1.575 m)   Wt 229 lb 6.4 oz (104.1 kg)   SpO2 98%   BMI 41.96 kg/m    Physical Exam Constitutional:      General: She is not in acute distress.    Appearance: Normal appearance. She is normal weight. She is not ill-appearing, toxic-appearing or diaphoretic.  HENT:     Head: Normocephalic.     Right Ear: A middle ear effusion is present.     Left Ear: A middle ear effusion is present.     Nose: Nose normal.     Right Turbinates: Enlarged and swollen.     Left Turbinates: Enlarged and swollen.     Mouth/Throat:     Mouth: Mucous membranes are dry.     Pharynx: No oropharyngeal exudate or posterior oropharyngeal erythema.  Eyes:     Extraocular Movements: Extraocular movements intact.     Pupils: Pupils are equal, round, and reactive to light.  Cardiovascular:     Rate and Rhythm: Normal rate and regular rhythm.     Pulses: Normal pulses.     Heart  sounds: Normal heart sounds.  Pulmonary:     Effort: Pulmonary effort is normal.     Breath sounds: Normal breath sounds.  Abdominal:     General: Bowel sounds are normal.     Tenderness: There is abdominal tenderness in the left upper quadrant.  Musculoskeletal:     Cervical back: Normal range of motion.  Neurological:     General: No focal deficit present.     Mental Status: She is alert and oriented to person, place, and time. Mental status is at baseline.  Psychiatric:        Mood and Affect: Mood normal.        Behavior: Behavior normal.        Thought Content: Thought content normal.        Judgment: Judgment normal.     Assessment & Plan:  Vertigo Assessment & Plan: Fluid in bil ears on exam, clear.  Meclizine to use prn   Orders: -     Meclizine HCl; Take one to 1/2 tablet bid  prn vertigo symptoms  Dispense: 30 tablet; Refill: 0  Gastroesophageal reflux disease with esophagitis without hemorrhage Assessment & Plan: Try to decrease and or avoid spicy foods, fried fatty foods, and also caffeine and chocolate as these can increase heartburn symptoms.   Trial pantoprazole  If no improvement with diet modifications and PPI could consider h pylori testing and or alpha gal  Pt currently with diarrhea so I also suspect GI virus   Orders: -     Pantoprazole Sodium; Take 1 tablet (40 mg total) by mouth daily.  Dispense: 30 tablet; Refill: 0     Follow up plan: Return if symptoms worsen or fail to improve, for f/u PCP if no improvement in symptoms.  Mort Sawyers, FNP

## 2023-02-15 NOTE — Patient Instructions (Signed)
------------------------------------  Recommend daily flonase and also zyrtec at night for allergies.   ------------------------------------

## 2023-02-15 NOTE — Assessment & Plan Note (Signed)
Fluid in bil ears on exam, clear.  Meclizine to use prn

## 2023-03-18 ENCOUNTER — Other Ambulatory Visit: Payer: Self-pay | Admitting: Family

## 2023-03-18 DIAGNOSIS — K2100 Gastro-esophageal reflux disease with esophagitis, without bleeding: Secondary | ICD-10-CM

## 2023-03-18 NOTE — Telephone Encounter (Signed)
 Please call patient:  Received refill request for pantoprazole medication that was prescribed by Tabitha 1 month ago.  If she still taking the pantoprazole medication?  Has it help with heartburn?  Patient needs CPE/follow-up scheduled.  She is overdue.

## 2023-03-21 NOTE — Telephone Encounter (Signed)
Noted. Refill(s) sent to pharmacy.  

## 2023-03-21 NOTE — Telephone Encounter (Signed)
 Called and spoke with patient she has been taking pantoprazole once daily and it has helped a lot with her heartburn. She would like a refill. Scheduled CPE for 03/26.

## 2023-04-13 ENCOUNTER — Ambulatory Visit (INDEPENDENT_AMBULATORY_CARE_PROVIDER_SITE_OTHER): Admitting: Primary Care

## 2023-04-13 ENCOUNTER — Encounter: Payer: Self-pay | Admitting: Primary Care

## 2023-04-13 VITALS — BP 108/66 | HR 82 | Temp 97.2°F | Ht 62.0 in | Wt 231.0 lb

## 2023-04-13 DIAGNOSIS — E66813 Obesity, class 3: Secondary | ICD-10-CM

## 2023-04-13 DIAGNOSIS — Z0001 Encounter for general adult medical examination with abnormal findings: Secondary | ICD-10-CM | POA: Diagnosis not present

## 2023-04-13 DIAGNOSIS — G8929 Other chronic pain: Secondary | ICD-10-CM

## 2023-04-13 DIAGNOSIS — I319 Disease of pericardium, unspecified: Secondary | ICD-10-CM

## 2023-04-13 DIAGNOSIS — Z6841 Body Mass Index (BMI) 40.0 and over, adult: Secondary | ICD-10-CM | POA: Diagnosis not present

## 2023-04-13 DIAGNOSIS — K2100 Gastro-esophageal reflux disease with esophagitis, without bleeding: Secondary | ICD-10-CM

## 2023-04-13 DIAGNOSIS — M25562 Pain in left knee: Secondary | ICD-10-CM

## 2023-04-13 DIAGNOSIS — I214 Non-ST elevation (NSTEMI) myocardial infarction: Secondary | ICD-10-CM | POA: Diagnosis not present

## 2023-04-13 DIAGNOSIS — R42 Dizziness and giddiness: Secondary | ICD-10-CM

## 2023-04-13 DIAGNOSIS — M25561 Pain in right knee: Secondary | ICD-10-CM | POA: Insufficient documentation

## 2023-04-13 LAB — COMPREHENSIVE METABOLIC PANEL
ALT: 20 U/L (ref 0–35)
AST: 15 U/L (ref 0–37)
Albumin: 4.1 g/dL (ref 3.5–5.2)
Alkaline Phosphatase: 61 U/L (ref 39–117)
BUN: 9 mg/dL (ref 6–23)
CO2: 28 meq/L (ref 19–32)
Calcium: 9.5 mg/dL (ref 8.4–10.5)
Chloride: 105 meq/L (ref 96–112)
Creatinine, Ser: 0.91 mg/dL (ref 0.40–1.20)
GFR: 80.38 mL/min (ref >60.00)
Glucose, Bld: 94 mg/dL (ref 70–99)
Potassium: 4 meq/L (ref 3.5–5.1)
Sodium: 140 meq/L (ref 135–145)
Total Bilirubin: 0.3 mg/dL (ref 0.2–1.2)
Total Protein: 6.5 g/dL (ref 6.0–8.3)

## 2023-04-13 LAB — LIPID PANEL
Cholesterol: 179 mg/dL (ref 0–200)
HDL: 42.9 mg/dL (ref >39.00)
LDL Cholesterol: 84 mg/dL (ref 0–99)
NonHDL: 135.7
Total CHOL/HDL Ratio: 4
Triglycerides: 258 mg/dL — ABNORMAL HIGH (ref 0.0–149.0)
VLDL: 51.6 mg/dL — ABNORMAL HIGH (ref 0.0–40.0)

## 2023-04-13 LAB — HEMOGLOBIN A1C: Hgb A1c MFr Bld: 5.7 % (ref 4.6–6.5)

## 2023-04-13 MED ORDER — WEGOVY 0.25 MG/0.5ML ~~LOC~~ SOAJ: 0.2500 mg | SUBCUTANEOUS | 0 refills | Status: DC

## 2023-04-13 NOTE — Assessment & Plan Note (Addendum)
 Per HPI, may be related to weight/weight gain No alarming findings on PE  Offered imaging, PT - declines today.  Will follow up in symptoms in 3 months at next appointment  I evaluated patient, was consulted regarding treatment, and agree with assessment and plan per Julaine Fusi, MSN, FNP student.   Mayra Reel, NP-C

## 2023-04-13 NOTE — Assessment & Plan Note (Addendum)
 HPI and PE benign. No alarm signs.  Continue to follow with cardiology as scheduled.   CMET, lipid panel ordered - results pending   I evaluated patient, was consulted regarding treatment, and agree with assessment and plan per Julaine Fusi, MSN, FNP student.   Mayra Reel, NP-C

## 2023-04-13 NOTE — Assessment & Plan Note (Addendum)
 Weight gain, per HPI.  Discussed pharmacologic options for treatment.  Will initiate Wegovy 0.25mg  once weekly for weight loss - well covered with insurance Noted CV benefit with this medication  Discussed medication method of action, administration process, titration schedule, and possible side effects.   Continue to work on diet and lifestyle modifications.   Hgb A1c and lipid panel ordered, pending   Will follow up in 3 months  I evaluated patient, was consulted regarding treatment, and agree with assessment and plan per Julaine Fusi, MSN, FNP student.   Mayra Reel, NP-C

## 2023-04-13 NOTE — Progress Notes (Signed)
 Established Patient Office Visit  Subjective   Patient ID: Jamie Ortega, female    DOB: 10-25-1985  Age: 38 y.o. MRN: [id number]  Chief Complaint  Patient presents with   Annual Exam    Not fasting Would like to discuss weight loss medication options    HPI  Jamie "Jamie Ortega" is a 38 year old female with history of myopericarditis, NSTEMI, GERD, vertigo for complete physical and follow up of chronic conditions. She would also like to discuss weight loss medication options and bilateral knee pain.    Immunizations: -Tetanus: Completed in 2020 - Influenza: declines   Diet: AM fasting; decreasing sodas/tea, increasing water intake, meal planning with lean cuisine/healthy choice; eats 2 full meals daily; under 2000 calories daily  Exercise: active at work, no exercise regimen  Eye exam: Completes annually  Dental exam: Completes semi-annually    Pap Smear: Completed in 2020; due, wants to schedule at later date  Denies chest, pain, shortness of breath. Reports occasional palpitations, managed with PRN metoprolol tartrate.   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 Attends church and prayer help with this. Has counseling offered through workplace and plans to look into this.  Notes weight gain and would like assistance with weight loss. She is working on dietary modifications. She previously had regular exercise regimen 5x/weekly which was paused when she had her cardiac event 02/2022. She has tried phentermine in the past for weight loss, with success, however rebound weight gain reported upon discontinuation of med.   Notes bilateral knee pain >1 year. Previously saw [facility] with no acute issues reported per their workup. Symptoms occurred when she had a regular exercise regimen and was a lower weight and currently. Thinks may be related to weight.   Was evaluated 02/15/23 for vertigo. Symptoms improved/resolved when effusion resolved. No longer using meclizine PRN.    Review of Systems  HENT:  Negative for hearing loss and tinnitus.   Eyes:  Negative for blurred vision and double vision.  Respiratory:  Negative for shortness of breath.   Cardiovascular:  Positive for palpitations. Negative for chest pain.       Occurs approx every 2 weeks, managed with PRN beta blocker  Gastrointestinal:  Positive for constipation. Negative for diarrhea, heartburn, nausea and vomiting.       Notes decreased # BM from baseline  Genitourinary:  Negative for frequency and urgency.  Musculoskeletal:  Positive for joint pain.       Bilateral knee pain, L>R, noticeable when going up/down stairs  Neurological:  Negative for dizziness and headaches.  Psychiatric/Behavioral:  Positive for depression. The patient is not nervous/anxious.       Objective:     BP 108/66   Pulse 82   Temp (!) 97.2 F (36.2 C) (Temporal)   Ht 5\' 2"  (1.575 m)   Wt 104.8 kg   LMP 03/27/2023   SpO2 97%   BMI 42.25 kg/m    BP Readings from Last 3 Encounters:  04/13/23 108/66  02/15/23 112/78  12/02/22 112/86   Wt Readings from Last 3 Encounters:  04/13/23 104.8 kg  02/15/23 104.1 kg  12/02/22 105.1 kg     Physical Exam Constitutional:      Appearance: Normal appearance. She is obese.  HENT:     Head: Normocephalic.     Right Ear: Tympanic membrane, ear canal and external ear normal.     Left Ear: Tympanic membrane, ear canal and external ear normal.  Eyes:     Extraocular Movements:  Extraocular movements intact.     Conjunctiva/sclera: Conjunctivae normal.     Pupils: Pupils are equal, round, and reactive to light.  Cardiovascular:     Rate and Rhythm: Normal rate and regular rhythm.     Pulses:          Radial pulses are 2+ on the right side and 2+ on the left side.       Dorsalis pedis pulses are 2+ on the right side and 2+ on the left side.  Pulmonary:     Effort: Pulmonary effort is normal.     Breath sounds: Normal breath sounds.  Abdominal:     General: There  is no distension.     Palpations: Abdomen is soft.     Tenderness: There is no abdominal tenderness.  Musculoskeletal:        General: Normal range of motion.     Cervical back: Normal range of motion and neck supple.     Right knee: No swelling, deformity or bony tenderness. Normal range of motion. No tenderness.     Left knee: No swelling, deformity or bony tenderness. Normal range of motion. No tenderness.     Right lower leg: No edema.     Left lower leg: No edema.     Comments: Left knee: discomfort with flexion   Skin:    General: Skin is warm and dry.  Neurological:     General: No focal deficit present.     Mental Status: She is alert and oriented to person, place, and time.  Psychiatric:        Mood and Affect: Mood normal.        Behavior: Behavior normal.        Thought Content: Thought content normal.      No results found for any visits on 04/13/23.    The ASCVD Risk score (Arnett DK, et al., 2019) failed to calculate for the following reasons:   The 2019 ASCVD risk score is only valid for ages 17 to 71   Risk score cannot be calculated because patient has a medical history suggesting prior/existing ASCVD    Assessment & Plan:   Problem List Items Addressed This Visit       Cardiovascular and Mediastinum   NSTEMI (non-ST elevated myocardial infarction) (HCC)   HPI and PE benign. No alarm signs.  Continue to follow with cardiology as scheduled.   GLP-1 initiated today  CMET, lipid panel ordered - results pending       Relevant Medications   metoprolol tartrate (LOPRESSOR) 25 MG tablet   Semaglutide-Weight Management (WEGOVY) 0.25 MG/0.5ML SOAJ   Myopericarditis   HPI and PE benign. No alarm signs.  Continue to follow with cardiology as scheduled.   CMET, lipid panel ordered - results pending       Relevant Medications   metoprolol tartrate (LOPRESSOR) 25 MG tablet   Semaglutide-Weight Management (WEGOVY) 0.25 MG/0.5ML SOAJ     Digestive    Gastroesophageal reflux disease with esophagitis without hemorrhage   Controlled on current regimen without breakthrough symptoms.   Continue pantoprazole 40mg  daily        Other   Vertigo   Resolved; no longer taking PRN meclizine       Preventative health care - Primary   Immunizations UTD. Pap smear due; patient declines today; to be scheduled at later date  Discussed the importance of a healthy diet and regular exercise in order for weight loss, and to reduce the risk  of further co-morbidity.  Exam stable. Labs pending.  Follow up in 1 year for repeat physical.       Relevant Orders   Lipid Panel   Comprehensive metabolic panel   Class 3 severe obesity due to excess calories with body mass index (BMI) of 40.0 to 44.9 in adult (HCC)   Weight gain noted per HPI Will initiate Wegovy 0.25mg  once weekly for weight loss CV benefit with this medication  Discussed medication method of action, administration process, titration schedule, and possible side effects.   Continue to work on diet and lifestyle modifications.   Hgb A1c and lipid panel ordered, pending   Will follow up in 3 months      Relevant Medications   Semaglutide-Weight Management (WEGOVY) 0.25 MG/0.5ML SOAJ   Other Relevant Orders   Lipid Panel   Comprehensive metabolic panel   Hemoglobin A1c   Bilateral knee pain   Per HPI, may be related to weight/weight gain No alarming findings on PE  Evaluated by ortho >1 year ago with no acute findings. No recent visit.  Offered imaging, PT - declines today.  Will follow up in symptoms in 3 months at next appointment       Follow up in 3 months with Mayra Reel NP   Lindell Spar, RN

## 2023-04-13 NOTE — Assessment & Plan Note (Addendum)
 Immunizations UTD. Pap smear due; patient declines today; to be scheduled at later date  Discussed the importance of a healthy diet and regular exercise in order for weight loss, and to reduce the risk of further co-morbidity.  Exam stable. Labs pending.  Follow up in 1 year for repeat physical.   I evaluated patient, was consulted regarding treatment, and agree with assessment and plan per Julaine Fusi, MSN, FNP student.   Mayra Reel, NP-C

## 2023-04-13 NOTE — Progress Notes (Signed)
 Subjective:    Patient ID: Jamie Ortega, female    DOB: [birth year], 38 y.o.   MRN: [id number]  HPI  Jamie Ortega is a very pleasant 38 y.o. female who presents today for complete physical and follow up of chronic conditions.  She would also like to discuss weight loss options. History of NSTEMI, myopericarditis, GERD, obesity. She has been working on her diet, reducing soda intake and sweet tea intake. She's also been calorie counting, under 2000 per day. She has not been exercising recently as she cannot find the time. Previously managed on phentermine, lost weight, regained her weight. She is requesting GLP 1 agonist treatment to "jumpstart" her weight loss.   She would also like to discuss bilateral knee pain, left worse than right. Her pain mostly occurs with flexion, especially walking upstairs. Previously following with orthopedics, underwent xrays, no physical therapy.   Immunizations: -Tetanus: Completed in 2020   Diet: Fair diet.  Exercise: No regular exercise.  Eye exam: Completes annually  Dental exam: Completes semi-annually    Pap Smear: Due, declines today.  Wt Readings from Last 3 Encounters:  04/13/23 231 lb (104.8 kg)  02/15/23 229 lb 6.4 oz (104.1 kg)  12/02/22 231 lb 12.8 oz (105.1 kg)   Body mass index is 42.25 kg/m.     Review of Systems  Constitutional:  Negative for unexpected weight change.  HENT:  Negative for rhinorrhea.   Respiratory:  Negative for cough and shortness of breath.   Cardiovascular:  Positive for palpitations. Negative for chest pain.  Gastrointestinal:  Negative for constipation and diarrhea.  Genitourinary:  Negative for difficulty urinating and menstrual problem.  Musculoskeletal:  Positive for arthralgias. Negative for myalgias.  Skin:  Negative for rash.  Allergic/Immunologic: Negative for environmental allergies.  Neurological:  Negative for dizziness, numbness and headaches.  Psychiatric/Behavioral:   The patient is nervous/anxious.          Past Medical History:  Diagnosis Date   Back pain affecting pregnancy in third trimester 02/10/2018   Genital herpes    Hypokalemia 03/05/2022   Labor and delivery, indication for care 05/02/2018   Left breast abscess 08/10/2021   Mass of upper outer quadrant of left breast 08/10/2021   NSVT (nonsustained ventricular tachycardia) (HCC) 03/05/2022   Right sciatic nerve pain 01/19/2018    Social History   Socioeconomic History   Marital status: Married    Spouse name: Not on file   Number of children: 2   Years of education: Not on file   Highest education level: Not on file  Occupational History   Not on file  Tobacco Use   Smoking status: Never   Smokeless tobacco: Never  Vaping Use   Vaping status: Never Used  Substance and Sexual Activity   Alcohol use: Not Currently    Comment: Socially   Drug use: No   Sexual activity: Not Currently    Partners: Male    Birth control/protection: None  Other Topics Concern   Not on file  Social History Narrative   Married.   2 children.    Works at [employer] [place].    Enjoys kayaking, spending time outdoors.    Social Drivers of Corporate investment banker Strain: Not on file  Food Insecurity: No Food Insecurity (03/04/2022)   Hunger Vital Sign    Worried About Running Out of Food in the Last Year: Never true    Ran Out of Food in the Last Year: Never  true  Transportation Needs: No Transportation Needs (03/04/2022)   PRAPARE - Administrator, Civil Service (Medical): No    Lack of Transportation (Non-Medical): No  Physical Activity: Not on file  Stress: Not on file  Social Connections: Not on file  Intimate Partner Violence: Not At Risk (03/04/2022)   Humiliation, Afraid, Rape, and Kick questionnaire    Fear of Current or Ex-Partner: No    Emotionally Abused: No    Physically Abused: No    Sexually Abused: No    Past Surgical History:  Procedure Laterality Date    LEFT HEART CATH AND CORONARY ANGIOGRAPHY N/A 03/03/2022   Procedure: LEFT HEART CATH AND CORONARY ANGIOGRAPHY;  Surgeon: [place], Peter M, MD;  Location: [facility];  Service: Cardiovascular;  Laterality: N/A;   WISDOM TOOTH EXTRACTION      Family History  Problem Relation Age of Onset   Cervical cancer Mother    Endometriosis Mother    Arthritis Mother    Neuropathy Mother    Hypertension Mother    Anxiety disorder Mother    Post-traumatic stress disorder Mother    Hyperthyroidism Mother    Hyperlipidemia Father    Rheum arthritis Maternal Grandmother    COPD Maternal Grandmother    Emphysema Maternal Grandmother    Prostate cancer Maternal Grandfather    Cancer Paternal Grandfather        Jaw   Ovarian cancer Neg Hx    Colon cancer Neg Hx    Breast cancer Neg Hx     No Known Allergies  Current Outpatient Medications on File Prior to Visit  Medication Sig Dispense Refill   fluticasone (FLONASE) 50 MCG/ACT nasal spray Place 2 sprays into both nostrils daily as needed for allergies.     metoprolol tartrate (LOPRESSOR) 25 MG tablet Take 25 mg by mouth as needed (palpitations).     pantoprazole (PROTONIX) 40 MG tablet Take 1 tablet (40 mg total) by mouth daily. for heartburn. 30 tablet 0   meclizine (ANTIVERT) 25 MG tablet Take one to 1/2 tablet bid prn vertigo symptoms (Patient not taking: Reported on 04/13/2023) 30 tablet 0   No current facility-administered medications on file prior to visit.    BP 108/66   Pulse 82   Temp (!) 97.2 F (36.2 C) (Temporal)   Ht 5\' 2"  (1.575 m)   Wt 231 lb (104.8 kg)   LMP 03/27/2023   SpO2 97%   BMI 42.25 kg/m  Objective:   Physical Exam HENT:     Right Ear: Tympanic membrane and ear canal normal.     Left Ear: Tympanic membrane and ear canal normal.  Eyes:     Pupils: Pupils are equal, round, and reactive to light.  Cardiovascular:     Rate and Rhythm: Normal rate and regular rhythm.  Pulmonary:     Effort: Pulmonary  effort is normal.     Breath sounds: Normal breath sounds.  Abdominal:     General: Bowel sounds are normal.     Palpations: Abdomen is soft.     Tenderness: There is no abdominal tenderness.  Musculoskeletal:        General: Normal range of motion.     Cervical back: Neck supple.  Skin:    General: Skin is warm and dry.  Neurological:     Mental Status: She is alert and oriented to person, place, and time.     Cranial Nerves: No cranial nerve deficit.  Deep Tendon Reflexes:     Reflex Scores:      Patellar reflexes are 2+ on the right side and 2+ on the left side. Psychiatric:        Mood and Affect: Mood normal.           Assessment & Plan:  Encounter for annual general medical examination with abnormal findings in adult Assessment & Plan: Immunizations UTD. Pap smear due; patient declines today; to be scheduled at later date  Discussed the importance of a healthy diet and regular exercise in order for weight loss, and to reduce the risk of further co-morbidity.  Exam stable. Labs pending.  Follow up in 1 year for repeat physical.   I evaluated patient, was consulted regarding treatment, and agree with assessment and plan per Julaine Fusi, MSN, FNP student.   Mayra Reel, NP-C    Class 3 severe obesity due to excess calories with body mass index (BMI) of 40.0 to 44.9 in adult, unspecified whether serious comorbidity present [facility]) Assessment & Plan: Weight gain, per HPI.  Discussed pharmacologic options for treatment.  Will initiate Wegovy 0.25mg  once weekly for weight loss - well covered with insurance Noted CV benefit with this medication  Discussed medication method of action, administration process, titration schedule, and possible side effects.   Continue to work on diet and lifestyle modifications.   Hgb A1c and lipid panel ordered, pending   Will follow up in 3 months  I evaluated patient, was consulted regarding treatment, and agree with  assessment and plan per Julaine Fusi, MSN, FNP student.   Mayra Reel, NP-C   Orders: -     Lipid panel -     Comprehensive metabolic panel -     Wegovy; Inject 0.25 mg into the skin once a week. For weight loss.  Dispense: 2 mL; Refill: 0 -     Hemoglobin A1c  NSTEMI (non-ST elevated myocardial infarction) [facility]) Assessment & Plan: HPI and PE benign. No alarm signs.  Continue to follow with cardiology as scheduled.   GLP-1 initiated today  CMET, lipid panel ordered - results pending   I evaluated patient, was consulted regarding treatment, and agree with assessment and plan per Julaine Fusi, MSN, FNP student.   Mayra Reel, NP-C   Orders: [facility]; Inject 0.25 mg into the skin once a week. For weight loss.  Dispense: 2 mL; Refill: 0  Myopericarditis Assessment & Plan: HPI and PE benign. No alarm signs.  Continue to follow with cardiology as scheduled.   CMET, lipid panel ordered - results pending   I evaluated patient, was consulted regarding treatment, and agree with assessment and plan per Julaine Fusi, MSN, FNP student.   Mayra Reel, NP-C   Orders: [facility]; Inject 0.25 mg into the skin once a week. For weight loss.  Dispense: 2 mL; Refill: 0  Gastroesophageal reflux disease with esophagitis without hemorrhage Assessment & Plan: Controlled on current regimen without breakthrough symptoms.   Continue pantoprazole 40mg  daily  I evaluated patient, was consulted regarding treatment, and agree with assessment and plan per Julaine Fusi, MSN, FNP student.   Mayra Reel, NP-C    Vertigo Assessment & Plan: Resolved; remain off PRN meclizine   I evaluated patient, was consulted regarding treatment, and agree with assessment and plan per Julaine Fusi, MSN, FNP student.   Mayra Reel, NP-C    Chronic pain of both knees Assessment & Plan: Per HPI, may be  related to weight/weight gain No alarming findings on PE  Offered imaging, PT - declines today.   Will follow up in symptoms in 3 months at next appointment  I evaluated patient, was consulted regarding treatment, and agree with assessment and plan per Julaine Fusi, MSN, FNP student.   Mayra Reel, NP-C          Doreene Nest, NP

## 2023-04-13 NOTE — Assessment & Plan Note (Addendum)
 HPI and PE benign. No alarm signs.  Continue to follow with cardiology as scheduled.   GLP-1 initiated today  CMET, lipid panel ordered - results pending   I evaluated patient, was consulted regarding treatment, and agree with assessment and plan per Julaine Fusi, MSN, FNP student.   Mayra Reel, NP-C

## 2023-04-13 NOTE — Assessment & Plan Note (Addendum)
 Resolved; remain off PRN meclizine   I evaluated patient, was consulted regarding treatment, and agree with assessment and plan per Julaine Fusi, MSN, FNP student.   Mayra Reel, NP-C

## 2023-04-13 NOTE — Patient Instructions (Addendum)
 Medications:  START wegovy 0.25mg  once weekly  Inject into skin (subcutaneous)  When you have ONE pen injector left, contact office for next dose   Labs: CMET, lipid panel, A1c today   Follow up: 3 months with Jamie Reel, NP

## 2023-04-13 NOTE — Assessment & Plan Note (Addendum)
 Controlled on current regimen without breakthrough symptoms.   Continue pantoprazole 40mg  daily  I evaluated patient, was consulted regarding treatment, and agree with assessment and plan per Julaine Fusi, MSN, FNP student.   Mayra Reel, NP-C

## 2023-04-14 ENCOUNTER — Telehealth: Payer: Self-pay

## 2023-04-14 ENCOUNTER — Other Ambulatory Visit (HOSPITAL_COMMUNITY): Payer: Self-pay

## 2023-04-14 NOTE — Telephone Encounter (Signed)
 Pharmacy Patient Advocate Encounter  Received notification from [facility] that Prior Authorization for Reginal Lutes has been APPROVED from 04/14/2023 to 11/10/2023. Ran test claim, Copay is $0.00. This test claim was processed through [facility]- copay amounts may vary at other pharmacies due to pharmacy/plan contracts, or as the patient moves through the different stages of their insurance plan.   PA #/Case ID/Reference #: [id number]

## 2023-04-14 NOTE — Telephone Encounter (Signed)
 Pharmacy Patient Advocate Encounter   Received notification from CoverMyMeds that prior authorization for [facility] is required/requested.   Insurance verification completed.   The patient is insured through [facility] .   Per test claim: PA required; PA submitted to above mentioned insurance via CoverMyMeds Key/confirmation #/EOC [id number] Status is pending

## 2023-04-15 ENCOUNTER — Other Ambulatory Visit: Payer: Self-pay | Admitting: Primary Care

## 2023-04-15 DIAGNOSIS — K2100 Gastro-esophageal reflux disease with esophagitis, without bleeding: Secondary | ICD-10-CM

## 2023-05-10 ENCOUNTER — Other Ambulatory Visit: Payer: Self-pay | Admitting: Primary Care

## 2023-05-10 DIAGNOSIS — K2100 Gastro-esophageal reflux disease with esophagitis, without bleeding: Secondary | ICD-10-CM

## 2023-05-12 ENCOUNTER — Other Ambulatory Visit: Payer: Self-pay | Admitting: Primary Care

## 2023-05-12 DIAGNOSIS — R002 Palpitations: Secondary | ICD-10-CM

## 2023-05-12 MED ORDER — METOPROLOL TARTRATE 25 MG PO TABS: 25.0000 mg | ORAL_TABLET | ORAL | 0 refills | Status: DC | PRN

## 2023-05-18 ENCOUNTER — Other Ambulatory Visit: Payer: Self-pay | Admitting: Primary Care

## 2023-05-18 DIAGNOSIS — I319 Disease of pericardium, unspecified: Secondary | ICD-10-CM

## 2023-05-18 DIAGNOSIS — I214 Non-ST elevation (NSTEMI) myocardial infarction: Secondary | ICD-10-CM

## 2023-05-18 DIAGNOSIS — E66813 Obesity, class 3: Secondary | ICD-10-CM

## 2023-05-18 MED ORDER — SEMAGLUTIDE-WEIGHT MANAGEMENT 0.5 MG/0.5ML ~~LOC~~ SOAJ: 0.5000 mg | SUBCUTANEOUS | 0 refills | Status: DC

## 2023-05-25 DIAGNOSIS — F331 Major depressive disorder, recurrent, moderate: Secondary | ICD-10-CM | POA: Diagnosis not present

## 2023-06-08 ENCOUNTER — Other Ambulatory Visit: Payer: Self-pay | Admitting: Primary Care

## 2023-06-08 DIAGNOSIS — R002 Palpitations: Secondary | ICD-10-CM

## 2023-06-23 DIAGNOSIS — F4323 Adjustment disorder with mixed anxiety and depressed mood: Secondary | ICD-10-CM | POA: Diagnosis not present

## 2023-06-24 DIAGNOSIS — F3341 Major depressive disorder, recurrent, in partial remission: Secondary | ICD-10-CM | POA: Diagnosis not present

## 2023-06-28 ENCOUNTER — Other Ambulatory Visit: Payer: Self-pay | Admitting: Primary Care

## 2023-06-28 DIAGNOSIS — R002 Palpitations: Secondary | ICD-10-CM

## 2023-06-29 ENCOUNTER — Other Ambulatory Visit: Payer: Self-pay | Admitting: Primary Care

## 2023-06-29 DIAGNOSIS — R002 Palpitations: Secondary | ICD-10-CM

## 2023-06-30 ENCOUNTER — Encounter (INDEPENDENT_AMBULATORY_CARE_PROVIDER_SITE_OTHER): Payer: Self-pay

## 2023-06-30 DIAGNOSIS — I319 Disease of pericardium, unspecified: Secondary | ICD-10-CM

## 2023-06-30 DIAGNOSIS — R0681 Apnea, not elsewhere classified: Secondary | ICD-10-CM

## 2023-06-30 DIAGNOSIS — R0683 Snoring: Secondary | ICD-10-CM

## 2023-06-30 DIAGNOSIS — Z6841 Body Mass Index (BMI) 40.0 and over, adult: Secondary | ICD-10-CM

## 2023-06-30 DIAGNOSIS — F4323 Adjustment disorder with mixed anxiety and depressed mood: Secondary | ICD-10-CM | POA: Diagnosis not present

## 2023-06-30 DIAGNOSIS — I214 Non-ST elevation (NSTEMI) myocardial infarction: Secondary | ICD-10-CM

## 2023-06-30 MED ORDER — SEMAGLUTIDE-WEIGHT MANAGEMENT 0.5 MG/0.5ML ~~LOC~~ SOAJ: 0.5000 mg | SUBCUTANEOUS | 0 refills | Status: DC

## 2023-06-30 NOTE — Telephone Encounter (Signed)

## 2023-07-14 ENCOUNTER — Ambulatory Visit: Admitting: Primary Care

## 2023-07-14 DIAGNOSIS — F4323 Adjustment disorder with mixed anxiety and depressed mood: Secondary | ICD-10-CM | POA: Diagnosis not present

## 2023-07-25 DIAGNOSIS — F3341 Major depressive disorder, recurrent, in partial remission: Secondary | ICD-10-CM | POA: Diagnosis not present

## 2023-08-04 DIAGNOSIS — F3342 Major depressive disorder, recurrent, in full remission: Secondary | ICD-10-CM | POA: Diagnosis not present

## 2023-08-04 DIAGNOSIS — F411 Generalized anxiety disorder: Secondary | ICD-10-CM | POA: Diagnosis not present

## 2023-08-08 ENCOUNTER — Ambulatory Visit: Admitting: Sleep Medicine

## 2023-08-08 ENCOUNTER — Encounter: Payer: Self-pay | Admitting: Sleep Medicine

## 2023-08-08 VITALS — BP 120/80 | HR 84 | Temp 98.1°F | Ht 62.0 in | Wt 226.4 lb

## 2023-08-08 DIAGNOSIS — Z6841 Body Mass Index (BMI) 40.0 and over, adult: Secondary | ICD-10-CM

## 2023-08-08 DIAGNOSIS — F4323 Adjustment disorder with mixed anxiety and depressed mood: Secondary | ICD-10-CM | POA: Diagnosis not present

## 2023-08-08 DIAGNOSIS — G4733 Obstructive sleep apnea (adult) (pediatric): Secondary | ICD-10-CM | POA: Diagnosis not present

## 2023-08-08 NOTE — Progress Notes (Signed)
 Name:Jamie Ortega MRN: [id number] DOB: 05-13-1985   CHIEF COMPLAINT:  EXCESSIVE DAYTIME SLEEPINESS   HISTORY OF PRESENT ILLNESS:  Jamie Ortega is a 38 y.o. w/ a h/o depression, morbid obesity and GERD who presents for c/o loud snoring, witnessed apnea and excessive daytime sleepiness which has been present for several years. Reports nocturnal awakenings due to unclear reasons, however does not have difficulty falling back to sleep. Reports a 30 lb weight gain over the last year. Admits to night sweats and morning headaches. Denies RLS symptoms, dream enactment, cataplexy, hypnagogic or hypnapompic hallucinations. Reports a family history of sleep apnea. Reports occasional drowsy driving. Drinks 1-2 sodas daily, occasional alcohol use, denies tobacco or illicit drug use.   Bedtime 11 pm- 12 am Sleep onset 10 mins Rise time 6:30 am   EPWORTH SLEEP SCORE 14    08/08/2023    8:00 AM  Results of the Epworth flowsheet  Sitting and reading 3  Watching TV 2  Sitting, inactive in a public place (e.g. a theatre or a meeting) 1  As a passenger in a car for an hour without a break 2  Lying down to rest in the afternoon when circumstances permit 3  Sitting and talking to someone 0  Sitting quietly after a lunch without alcohol 2  In a car, while stopped for a few minutes in traffic 1  Total score 14     PAST MEDICAL HISTORY :   has a past medical history of Allergy (2017), Anxiety (Oct. 2018), Back pain affecting pregnancy in third trimester (02/10/2018), Depression (10/06/16), Genital herpes, GERD (gastroesophageal reflux disease) (09/17/17), Hypokalemia (03/05/2022), Labor and delivery, indication for care (05/02/2018), Left breast abscess (08/10/2021), Mass of upper outer quadrant of left breast (08/10/2021), NSVT (nonsustained ventricular tachycardia) (HCC) (03/05/2022), and Right sciatic nerve pain (01/19/2018).  has a past surgical history that includes Wisdom tooth  extraction and LEFT HEART CATH AND CORONARY ANGIOGRAPHY (N/A, 03/03/2022). Prior to Admission medications   Medication Sig Start Date End Date Taking? Authorizing Provider  desvenlafaxine (PRISTIQ) 100 MG 24 hr tablet Take 100 mg by mouth every morning. 06/27/23  Yes [provider]  fluticasone (FLONASE) 50 MCG/ACT nasal spray Place 2 sprays into both nostrils daily as needed for allergies.   Yes [provider]  metoprolol  tartrate (LOPRESSOR ) 25 MG tablet TAKE 1 TABLET (25 MG TOTAL) BY MOUTH AS NEEDED (PALPITATIONS). 06/28/23  Yes Clark, Katherine K, NP  pantoprazole  (PROTONIX ) 40 MG tablet TAKE 1 TABLET (40 MG TOTAL) BY MOUTH DAILY. FOR HEARTBURN. 04/15/23  Yes Gretta Comer POUR, NP  Semaglutide -Weight Management 0.5 MG/0.5ML SOAJ Inject 0.5 mg into the skin once a week. 06/30/23  Yes Gretta Comer POUR, NP  meclizine  (ANTIVERT ) 25 MG tablet Take one to 1/2 tablet bid prn vertigo symptoms Patient not taking: Reported on 08/08/2023 02/15/23   Corwin Antu, FNP   No Known Allergies  FAMILY HISTORY:  family history includes Anxiety disorder in her mother; Arthritis in her mother; Asthma in her mother; COPD in her maternal grandmother; Cancer in her paternal grandfather; Cervical cancer in her mother; Depression in her mother; Drug abuse in her mother; Emphysema in her maternal grandmother; Endometriosis in her mother; Hyperlipidemia in her father; Hypertension in her mother; Hyperthyroidism in her mother; Miscarriages / Stillbirths in her mother; Neuropathy in her mother; Post-traumatic stress disorder in her mother; Prostate cancer in her maternal grandfather; Rheum arthritis in her maternal grandmother. SOCIAL HISTORY:  reports that she  has never smoked. She has never used smokeless tobacco. She reports that she does not currently use alcohol. She reports that she does not use drugs.   Review of Systems:  Gen:  Denies  fever, sweats, chills weight loss  HEENT: Denies blurred  vision, double vision, ear pain, eye pain, hearing loss, nose bleeds, sore throat Cardiac:  No dizziness, chest pain or heaviness, chest tightness,edema, No JVD Resp:   No cough, -sputum production, -shortness of breath,-wheezing, -hemoptysis,  Gi: Denies swallowing difficulty, stomach pain, nausea or vomiting, diarrhea, constipation, bowel incontinence Gu:  Denies bladder incontinence, burning urine Ext:   Denies Joint pain, stiffness or swelling Skin: Denies  skin rash, easy bruising or bleeding or hives Endoc:  Denies polyuria, polydipsia , polyphagia or weight change Psych:   Denies depression, insomnia or hallucinations  Other:  All other systems negative  VITAL SIGNS: BP 120/80 (BP Location: Left Arm, Patient Position: Sitting, Cuff Size: Large)   Pulse 84   Temp 98.1 F (36.7 C) (Oral)   Ht 5' 2 (1.575 m)   Wt 226 lb 6.4 oz (102.7 kg)   SpO2 96%   BMI 41.41 kg/m    Physical Examination:   General Appearance: No distress  EYES PERRLA, EOM intact.   NECK Supple, No JVD Pulmonary: normal breath sounds, No wheezing.  CardiovascularNormal S1,S2.  No m/r/g.   Abdomen: Benign, Soft, non-tender. Skin:   warm, no rashes, no ecchymosis  Extremities: normal, no cyanosis, clubbing. Neuro:without focal findings,  speech normal  PSYCHIATRIC: Mood, affect within normal limits.   ASSESSMENT AND PLAN  OSA I suspect that OSA is likely present due to clinical presentation. Discussed the consequences of untreated sleep apnea. Advised not to drive drowsy for safety of patient and others. Will complete further evaluation with a home sleep study and follow up to review results.    Morbid obesity Counseled patient on diet and lifestyle modification.    MEDICATION ADJUSTMENTS/LABS AND TESTS ORDERED: Recommend Sleep Study   Patient  satisfied with Plan of action and management. All questions answered  Follow up to review HST results and treatment plan.   I spent a total of 51  minutes reviewing chart data, face-to-face evaluation with the patient, counseling and coordination of care as detailed above.    Rhyann Berton, M.D.  Sleep Medicine [facility]

## 2023-08-08 NOTE — Patient Instructions (Signed)
 Jamie Ortega

## 2023-08-10 DIAGNOSIS — F4323 Adjustment disorder with mixed anxiety and depressed mood: Secondary | ICD-10-CM | POA: Diagnosis not present

## 2023-08-15 ENCOUNTER — Telehealth: Payer: Self-pay

## 2023-08-15 NOTE — Telephone Encounter (Signed)
 Copied from CRM [id number]. Topic: Clinical - Request for Lab/Test Order >> Aug 15, 2023  3:03 PM Whitney O wrote: Reason for RMF:ejupzwu is calling cause dr reddy  order me a at home sleep test didnt know it was going to cost 300 dollars or is ir cheaper to go to a facility and get it done or will that cost more please reach out to patient for more information concerning the sleep test  [phone]

## 2023-08-16 NOTE — Telephone Encounter (Signed)
 I called and explained to Jamie Ortega that the most [employer] would charge her would be 275.00. If she would just call them and let them run it through her [employer] it could be even less then that. Jamie Ortega stated she has met her 500.00 deductible yet for the year. I did also give her [employer]'s phone number and the codes [phone] and [phone] for her to check with her insurance for an in lab sleep study. I told her to give them a chance and let us  know if she couldn't afford it

## 2023-08-18 NOTE — Telephone Encounter (Signed)
 Noted. Nothing further needed.

## 2023-09-07 DIAGNOSIS — F4323 Adjustment disorder with mixed anxiety and depressed mood: Secondary | ICD-10-CM | POA: Diagnosis not present

## 2023-10-06 DIAGNOSIS — I214 Non-ST elevation (NSTEMI) myocardial infarction: Secondary | ICD-10-CM | POA: Diagnosis not present

## 2023-10-06 DIAGNOSIS — O09521 Supervision of elderly multigravida, first trimester: Secondary | ICD-10-CM | POA: Diagnosis not present

## 2023-10-06 DIAGNOSIS — N841 Polyp of cervix uteri: Secondary | ICD-10-CM | POA: Diagnosis not present

## 2023-11-04 ENCOUNTER — Telehealth: Payer: Self-pay

## 2023-11-04 ENCOUNTER — Other Ambulatory Visit (HOSPITAL_COMMUNITY): Payer: Self-pay

## 2023-11-04 NOTE — Telephone Encounter (Signed)
 Pharmacy Patient Advocate Encounter   Received notification from Onbase that prior authorization for Wegovy  0.25 is due for renewal.    Unable to verify insurance. Insurance card from patient media expired 02/23/21. Eligibility checks show no insurance found.  PA request not submitted at this time.

## 2023-11-04 NOTE — Addendum Note (Signed)
 Addended by: Marcos Ruelas K on: 11/04/2023 04:48 PM   Modules accepted: Orders

## 2023-11-04 NOTE — Telephone Encounter (Signed)
 Noted

## 2023-12-13 ENCOUNTER — Telehealth (HOSPITAL_COMMUNITY): Payer: Self-pay | Admitting: Cardiology

## 2023-12-13 NOTE — Telephone Encounter (Signed)
 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  Reports she is currently pregnant again and experiencing palps, would like to know if its safe to restart meds that were previously prescribed   Chart reviewed-last  OV with Dr Wonda Will route to [facility]

## 2023-12-14 NOTE — Telephone Encounter (Signed)
 Attempted to call pt, unable to reach. LMTCB to schedule an appt with Dr. Wonda.

## 2023-12-22 NOTE — Telephone Encounter (Signed)
 Spoke with pt, Follow up scheduled

## 2023-12-27 ENCOUNTER — Other Ambulatory Visit (HOSPITAL_COMMUNITY): Payer: Self-pay

## 2023-12-27 ENCOUNTER — Encounter: Payer: Self-pay | Admitting: Cardiovascular Disease

## 2023-12-27 ENCOUNTER — Ambulatory Visit: Attending: Cardiovascular Disease | Admitting: Cardiovascular Disease

## 2023-12-27 VITALS — BP 104/60 | HR 86 | Ht 62.0 in | Wt 234.6 lb

## 2023-12-27 DIAGNOSIS — R002 Palpitations: Secondary | ICD-10-CM

## 2023-12-27 MED ORDER — METOPROLOL TARTRATE 25 MG PO TABS
25.0000 mg | ORAL_TABLET | ORAL | 0 refills | Status: AC | PRN
  Filled 2023-12-27: qty 60, 30d supply, fill #0

## 2023-12-27 NOTE — Patient Instructions (Addendum)
 Medication Instructions:  Take Metoprolol  as needed for palpitations. Continue all other medications as listed.   *If you need a refill on your cardiac medications before your next appointment, please call your pharmacy*   Follow-Up: At [facility], you and your health needs are our priority.  As part of our continuing mission to provide you with exceptional heart care, our providers are all part of one team.  This team includes your primary Cardiologist (physician) and Advanced Practice Providers or APPs (Physician Assistants and Nurse Practitioners) who all work together to provide you with the care you need, when you need it.  Your next appointment:   1 year(s)  Provider:   Ozell Fell, MD    We recommend signing up for the patient portal called MyChart.  Sign up information is provided on this After Visit Summary.  MyChart is used to connect with patients for Virtual Visits (Telemedicine).  Patients are able to view lab/test results, encounter notes, upcoming appointments, etc.  Non-urgent messages can be sent to your provider as well.   To learn more about what you can do with MyChart, go to [email or web address].

## 2023-12-27 NOTE — Assessment & Plan Note (Signed)
 Rx updated for metoprolol  25 mg up to twice daily as needed for heart palpitations. Palpitations at a low enough frequency that I don't think she needs scheduled beta blocker Rx. Reserve ZIO monitor for more frequent/worsened symptoms. Previous echo reviewed with no evidence of LV dysfunction or structural heart abnormality. Previous ZIO reviewed with benign findings and no significant arrhythmia. Advised her to continue regular prenatal care as she is doing, call or message with any change or worsening cardiac symptoms. 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

## 2023-12-27 NOTE — Progress Notes (Unsigned)
 Cardiology Office Note:    Date:  12/27/2023   ID:  Jamie Ortega, DOB 1985-02-27, MRN [id number]  PCP:  Gretta Comer POUR, NP   [facility] Providers Cardiologist:  Ozell Fell, MD     Referring MD: Gretta Comer POUR, NP   Chief Complaint  Patient presents with   Follow-up    Hx of myocarditis    History of Present Illness:    Jamie Ortega is a 38 y.o. 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 She has been doing well. She is currently [redacted] weeks gestation with her 4th child (74 yo son, 23 year old daughter, 22 year-old son). She was doing well until the past few weeks when she starting having increased heart palpitations. Sometimes these are associated with dizziness. She cut out sweet tea and symptoms improved significantly. However, yesterday she had another episode. Use of prn metoprolol  has been effective but she hasn't taken it recently because she wasn't sure about the safety during pregnancy. No other complaints. No chest pain or shortness of breath. Reports good BP checks during prenatal visits.    Current Medications: Current Meds  Medication Sig   desvenlafaxine (PRISTIQ) 100 MG 24 hr tablet Take 100 mg by mouth every morning.   fluticasone (FLONASE) 50 MCG/ACT nasal spray Place 2 sprays into both nostrils daily as needed  for allergies.   pantoprazole  (PROTONIX ) 40 MG tablet TAKE 1 TABLET (40 MG TOTAL) BY MOUTH DAILY. FOR HEARTBURN.   [DISCONTINUED] metoprolol  tartrate (LOPRESSOR ) 25 MG tablet TAKE 1 TABLET (25 MG TOTAL) BY MOUTH AS NEEDED (PALPITATIONS).     Allergies:   Patient has no known allergies.   ROS:   Please see the history of present illness.    All other systems reviewed and are negative.  EKGs/Labs/Other Studies Reviewed:    The following studies were reviewed today: Cardiac Studies & Procedures   ______________________________________________________________________________________________ CARDIAC CATHETERIZATION  CARDIAC CATHETERIZATION 03/03/2022  Conclusion   The left ventricular systolic function is normal.   LV end diastolic pressure is mildly elevated.   The left ventricular ejection fraction is 55-65% by visual estimate.  Normal coronary anatomy. No evidence of SCAD or thrombosis Normal LV function Mildly elevated LVEDP 16-19 mm Hg  Plan: consider other etiologies such as myocarditis.  Findings Coronary Findings Diagnostic  Dominance: Right  Left Main Vessel was injected. Vessel is normal in caliber. Vessel is angiographically normal.  Left Anterior Descending Vessel was injected. Vessel is normal in caliber. Vessel is angiographically normal.  Left Circumflex Vessel was injected. Vessel is normal in caliber. Vessel is angiographically normal.  Right Coronary Artery Vessel was injected. Vessel is normal in caliber. Vessel is angiographically normal.  Intervention  No interventions have been documented.   STRESS TESTS  EXERCISE TOLERANCE TEST (ETT) 06/08/2022  Interpretation Summary   Negative EKG stress test for ischemia.   Average exercise capacity (9:36  min:s; 11.2 METS). Normal HR/BP response to exercise.   ECHOCARDIOGRAM  ECHOCARDIOGRAM COMPLETE 03/04/2022  Narrative ECHOCARDIOGRAM REPORT    Patient Name:   Jamie Ortega Date of  Exam: 03/04/2022 Medical Rec #:  [id number]                   Height:       62.0 in Accession #:    [id number]                  Weight:       203.7 lb Date of Birth:  1985-03-08                    BSA:          1.927 m Patient Age:    36 years                    BP:           103/63 mmHg Patient Gender: F                           HR:           65 bpm. Exam Location:  Inpatient  Procedure: 2D Echo, Cardiac Doppler, Color Doppler and Strain Analysis  Indications:    Chest Pain R07.9  History:        Patient has no prior history of Echocardiogram examinations. Previous Myocardial Infarction.  Sonographer:    Thea Norlander Referring Phys: MANUELITA KATHEE RUMMER  IMPRESSIONS   1. Left ventricular ejection fraction, by estimation, is 65 to 70%. The left ventricle has normal function. The left ventricle has no regional wall motion abnormalities. Left ventricular diastolic parameters were normal. 2. Right ventricular systolic function is normal. The right ventricular size is normal. 3. The mitral valve is normal in structure. No evidence of mitral valve regurgitation. 4. The aortic valve is tricuspid. Aortic valve regurgitation is not visualized. 5. The inferior vena cava is normal in size with greater than 50% respiratory variability, suggesting right atrial pressure of 3 mmHg.  FINDINGS Left Ventricle: Left ventricular ejection fraction, by estimation, is 65 to 70%. The left ventricle has normal function. The left ventricle has no regional wall motion abnormalities. The left ventricular internal cavity size was normal in size. There is no left ventricular hypertrophy. Left ventricular diastolic parameters were normal.  Right Ventricle: The right ventricular size is normal. Right vetricular wall thickness was not assessed. Right ventricular systolic function is normal.  Left Atrium: Left atrial size was normal in size.  Right Atrium: Right atrial size was normal in size.  Pericardium: There  is no evidence of pericardial effusion.  Mitral Valve: The mitral valve is normal in structure. No evidence of mitral valve regurgitation.  Tricuspid Valve: The tricuspid valve is normal in structure. Tricuspid valve regurgitation is trivial.  Aortic Valve: The aortic valve is tricuspid. Aortic valve regurgitation is not visualized. Aortic valve mean gradient measures 4.0 mmHg. Aortic valve peak gradient measures 7.8 mmHg. Aortic valve area, by VTI measures 2.25 cm.  Pulmonic Valve: The pulmonic valve was normal in structure. Pulmonic valve regurgitation is not visualized.  Aorta: The aortic root and ascending aorta are structurally normal, with no evidence of dilitation.  Venous: The inferior vena cava is normal in size with greater than 50% respiratory variability, suggesting right atrial pressure of 3 mmHg.  IAS/Shunts: No atrial level shunt detected  by color flow Doppler.   LEFT VENTRICLE PLAX 2D LVIDd:         4.70 cm   Diastology LVIDs:         3.00 cm   LV e' medial:    10.90 cm/s LV PW:         0.70 cm   LV E/e' medial:  8.1 LV IVS:        0.90 cm   LV e' lateral:   14.00 cm/s LVOT diam:     2.10 cm   LV E/e' lateral: 6.3 LV SV:         71 LV SV Index:   37 LVOT Area:     3.46 cm  3D Volume EF: 3D EF:        62 % LV EDV:       113 ml LV ESV:       43 ml LV SV:        70 ml  RIGHT VENTRICLE             IVC RV S prime:     10.40 cm/s  IVC diam: 1.60 cm TAPSE (M-mode): 2.1 cm  LEFT ATRIUM             Index        RIGHT ATRIUM           Index LA diam:        3.30 cm 1.71 cm/m   RA Area:     14.90 cm LA Vol (A2C):   50.1 ml 26.00 ml/m  RA Volume:   37.40 ml  19.41 ml/m LA Vol (A4C):   50.9 ml 26.42 ml/m LA Biplane Vol: 53.4 ml 27.72 ml/m AORTIC VALVE AV Area (Vmax):    2.31 cm AV Area (Vmean):   2.22 cm AV Area (VTI):     2.25 cm AV Vmax:           140.00 cm/s AV Vmean:          93.700 cm/s AV VTI:            0.315 m AV Peak Grad:      7.8 mmHg AV Mean  Grad:      4.0 mmHg LVOT Vmax:         93.30 cm/s LVOT Vmean:        59.933 cm/s LVOT VTI:          0.204 m LVOT/AV VTI ratio: 0.65  AORTA Ao Root diam: 3.20 cm Ao Asc diam:  2.90 cm  MITRAL VALVE MV Area (PHT): 3.65 cm    SHUNTS MV Decel Time: 208 msec    Systemic VTI:  0.20 m MV E velocity: 88.70 cm/s  Systemic Diam: 2.10 cm MV A velocity: 71.80 cm/s MV E/A ratio:  1.24  Vina Gull MD Electronically signed by Vina Gull MD Signature Date/Time: 03/04/2022/5:22:00 PM    Final    MONITORS  LONG TERM MONITOR (3-14 DAYS) 04/02/2022  Narrative Patch Wear Time:  10 days and 7 hours (2024-03-01T11:59:40-0500 to 2024-03-11T20:35:43-0400)  Patient had a min HR of 36 bpm, max HR of 171 bpm, and avg HR of 75 bpm. Predominant underlying rhythm was Sinus Rhythm. Second Degree AV Block-Mobitz I (Wenckebach) was present. Isolated SVEs were rare (<1.0%), and no SVE Couplets or SVE Triplets were present. Isolated VEs were rare (<1.0%), VE Couplets were rare (<1.0%), and no VE Triplets were present.  SUMMARY: The basic rhythm is normal sinus with  an average HR of 75 bpm. Rare PAC's and PVC's noted. Few episodes of Mobitz 2 AV block noted during sleep hours. Normal sinus rhythm at times of patient-triggered events.     CARDIAC MRI  MR CARDIAC MORPHOLOGY W WO CONTRAST 03/04/2022  Narrative CLINICAL DATA:  Clinical question of myocarditis Study assumes BSA of 2.01 m2.  EXAM: CARDIAC MRI  TECHNIQUE: The patient was scanned on a 1.5 Tesla GE magnet. A dedicated cardiac coil was used. Functional imaging was done using Fiesta sequences. 2,3, and 4 chamber views were done to assess for RWMA's. Modified Simpson's rule using a short axis stack was used to calculate an ejection fraction on a dedicated work [employer]. The patient received 10 cc of Gadavist . After 10 minutes inversion recovery sequences were used to assess for infiltration and scar tissue. Flow  quantification was performed 2 times during this examination with flow quantification performed at the levels of the ascending aorta above the valve, pulmonary artery above the valve.  CONTRAST:  10 cc  of Gadavist   FINDINGS: 1. Normal left ventricular size, with LVEDD 50 mm, and LVEDVi 70 mL/m2.  Normal left ventricular thickness, with intraventricular septal thickness of 8 mm, posterior wall thickness of 8 mm, and myocardial mass index of 43 g/m2.  Normal left ventricular systolic function (LVEF =65%). There are no regional wall motion abnormalities.  Left ventricular parametric mapping notable for increased apical lateral T2 signal (62 ms).  Suboptimal Native T1 acquisition limits ECV analysis.  There is late gadolinium enhancement in the left ventricular myocardium: Transmural apical lateral LGE.  2.  Mild increase in right ventricular size with RVEDVI 88 mL/m2.  Normal right ventricular thickness.  Low normal right ventricular systolic function (RVEF =47%). There are no regional wall motion abnormalities or aneurysms.  3.  Normal left and right atrial size.  4. Normal size of the aortic root, ascending aorta and pulmonary artery.  5. Valve assessment:  Aortic Valve: Tri-leaflet aortic valve. There is no significant regurgitation. Regurgitant fraction < 1%.  Pulmonic Valve: There is no significant regurgitation. Regurgitant fraction < 1%.  Tricuspid Valve: There is mild central regurgitation. Regurgitant fraction 7%.  Mitral Valve: There is mild central regurgitation. Regurgitant fraction 13%.  6. Apical lateral pericardial thickening with pericardial hyper-enhancement. Trivial pericardial effusion.  7. Grossly, no extracardiac findings. Recommended dedicated study if concerned for non-cardiac pathology.  IMPRESSION: Study meets 2018 [facility] Criteria for myopericarditis.  Stanly Leavens MD   Electronically Signed By: Stanly Leavens M.D. On: 03/04/2022 13:06   ______________________________________________________________________________________________      EKG:   EKG Interpretation Date/Time:  Tuesday December 27 2023 15:47:30 EST Ventricular Rate:  86 PR Interval:  146 QRS Duration:  74 QT Interval:  352 QTC Calculation: 421 R Axis:   10  Text Interpretation: Normal sinus rhythm Normal ECG When compared with ECG of 05-Mar-2022 10:14, Nonspecific T wave abnormality no longer evident in Lateral leads Confirmed by Wonda Sharper [id number]) on 12/27/2023 3:55:10 PM    Recent Labs: 04/13/2023: ALT 20; BUN 9; Creatinine, Ser 0.91; Potassium 4.0; Sodium 140  Recent Lipid Panel    Component Value Date/Time   CHOL 179 04/13/2023 0959   TRIG 258.0 (H) 04/13/2023 0959   HDL 42.90 04/13/2023 0959   CHOLHDL 4 04/13/2023 0959   VLDL 51.6 (H) 04/13/2023 0959   LDLCALC 84 04/13/2023 0959   LDLCALC 101 (H) 02/22/2020 1550     Risk Assessment/Calculations:  Physical Exam:    VS:  BP 104/60 (BP Location: Right Arm, Patient Position: Sitting, Cuff Size: Large)   Pulse 86   Ht 5' 2 (1.575 m)   Wt 234 lb 9.6 oz (106.4 kg)   SpO2 98%   BMI 42.91 kg/m     Wt Readings from Last 3 Encounters:  12/27/23 234 lb 9.6 oz (106.4 kg)  08/08/23 226 lb 6.4 oz (102.7 kg)  04/13/23 231 lb (104.8 kg)     GEN:  Well nourished, well developed in no acute distress HEENT: Normal NECK: No JVD; No carotid bruits LYMPHATICS: No lymphadenopathy CARDIAC: RRR, no murmurs, rubs, gallops RESPIRATORY:  Clear to auscultation without rales, wheezing or rhonchi  ABDOMEN: Soft, non-tender, non-distended MUSCULOSKELETAL:  No edema; No deformity  SKIN: Warm and dry NEUROLOGIC:  Alert and oriented x 3 PSYCHIATRIC:  Normal affect   Assessment & Plan Heart palpitations Rx updated for metoprolol  25 mg up to twice daily as needed for heart palpitations. Palpitations at a low enough frequency that I don't  think she needs scheduled beta blocker Rx. Reserve ZIO monitor for more frequent/worsened symptoms. Previous echo reviewed with no evidence of LV dysfunction or structural heart abnormality. Previous ZIO reviewed with benign findings and no significant arrhythmia. Advised her to continue regular prenatal care as she is doing, call or message with any change or worsening cardiac symptoms. 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  Palpitations             Medication Adjustments/Labs and Tests Ordered: Current medicines are reviewed at length with the patient today.  Concerns regarding medicines are outlined above.  Orders Placed This Encounter  Procedures   EKG 12-Lead   Meds ordered this encounter  Medications   metoprolol  tartrate (LOPRESSOR ) 25 MG tablet    Sig: Take 1 tablet (25 mg total) by mouth as needed (palpitations).    Dispense:  60 tablet    Refill:  0    Patient Instructions  Medication Instructions:  Take Metoprolol  as needed for palpitations. Continue all other medications as listed.   *If you need a refill on your cardiac medications before your next appointment, please call your pharmacy*   Follow-Up: At [facility], you and your health needs are our priority.  As part of our continuing mission to provide you with exceptional heart care, our providers are all part of one team.  This team includes your primary Cardiologist (physician) and Advanced Practice Providers or APPs (Physician Assistants and Nurse Practitioners) who all work together to provide you with the care you need, when you need it.  Your next appointment:   1 year(s)  Provider:   Ozell Fell, MD    We recommend signing up for the patient portal called MyChart.  Sign up information is provided on this After Visit Summary.  MyChart is used to connect with patients for Virtual Visits (Telemedicine).  Patients are able to view  lab/test results, encounter notes, upcoming appointments, etc.  Non-urgent messages can be sent to your provider as well.   To learn more about what you can do with MyChart, go to [email or web address].          Signed, Ozell Fell, MD  12/27/2023 4:09 PM    [facility]
